# Patient Record
Sex: Male | Born: 1940 | Race: White | Hispanic: No | Marital: Married | Smoking: Former smoker
Health system: Southern US, Community
[De-identification: ages and names within clinical notes are randomized; demographics above are authoritative.]

## PROBLEM LIST (undated history)

## (undated) DIAGNOSIS — J449 Chronic obstructive pulmonary disease, unspecified: Secondary | ICD-10-CM

## (undated) DIAGNOSIS — E739 Lactose intolerance, unspecified: Secondary | ICD-10-CM

## (undated) DIAGNOSIS — M1990 Unspecified osteoarthritis, unspecified site: Secondary | ICD-10-CM

## (undated) DIAGNOSIS — M25559 Pain in unspecified hip: Secondary | ICD-10-CM

## (undated) DIAGNOSIS — I10 Essential (primary) hypertension: Secondary | ICD-10-CM

## (undated) DIAGNOSIS — K4090 Unilateral inguinal hernia, without obstruction or gangrene, not specified as recurrent: Secondary | ICD-10-CM

## (undated) DIAGNOSIS — I959 Hypotension, unspecified: Secondary | ICD-10-CM

## (undated) DIAGNOSIS — K219 Gastro-esophageal reflux disease without esophagitis: Secondary | ICD-10-CM

## (undated) DIAGNOSIS — M545 Low back pain: Secondary | ICD-10-CM

## (undated) DIAGNOSIS — I2510 Atherosclerotic heart disease of native coronary artery without angina pectoris: Secondary | ICD-10-CM

## (undated) DIAGNOSIS — F329 Major depressive disorder, single episode, unspecified: Secondary | ICD-10-CM

## (undated) HISTORY — DX: Lactose intolerance, unspecified: E73.9

## (undated) HISTORY — DX: Pain in unspecified hip: M25.559

## (undated) HISTORY — PX: RECONSTRUCTION MID-FACE: SUR1085

## (undated) HISTORY — DX: Low back pain: M54.5

## (undated) HISTORY — DX: Atherosclerotic heart disease of native coronary artery without angina pectoris: I25.10

## (undated) HISTORY — DX: Unspecified osteoarthritis, unspecified site: M19.90

## (undated) HISTORY — DX: Essential (primary) hypertension: I10

## (undated) HISTORY — PX: TONSILLECTOMY: SUR1361

## (undated) HISTORY — DX: Chronic obstructive pulmonary disease, unspecified: J44.9

## (undated) HISTORY — DX: Unilateral inguinal hernia, without obstruction or gangrene, not specified as recurrent: K40.90

## (undated) HISTORY — DX: Major depressive disorder, single episode, unspecified: F32.9

## (undated) HISTORY — DX: Hypotension, unspecified: I95.9

---
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
## 1997-02-10 ENCOUNTER — Encounter: Payer: Self-pay | Admitting: Internal Medicine

## 1997-04-10 ENCOUNTER — Encounter: Payer: Self-pay | Admitting: Internal Medicine

## 2001-04-18 ENCOUNTER — Encounter: Payer: Self-pay | Admitting: Internal Medicine

## 2004-07-14 ENCOUNTER — Ambulatory Visit: Payer: Self-pay | Admitting: Internal Medicine

## 2004-08-18 ENCOUNTER — Ambulatory Visit: Payer: Self-pay | Admitting: Internal Medicine

## 2004-11-14 ENCOUNTER — Ambulatory Visit: Payer: Self-pay | Admitting: Internal Medicine

## 2005-02-16 ENCOUNTER — Ambulatory Visit: Payer: Self-pay | Admitting: Internal Medicine

## 2005-04-26 ENCOUNTER — Ambulatory Visit: Payer: Self-pay | Admitting: Family Medicine

## 2005-05-01 ENCOUNTER — Ambulatory Visit: Payer: Self-pay | Admitting: Internal Medicine

## 2005-10-23 ENCOUNTER — Ambulatory Visit: Payer: Self-pay | Admitting: Internal Medicine

## 2006-01-22 ENCOUNTER — Ambulatory Visit: Payer: Self-pay | Admitting: Internal Medicine

## 2006-05-28 ENCOUNTER — Ambulatory Visit: Payer: Self-pay | Admitting: Internal Medicine

## 2006-07-10 HISTORY — PX: INGUINAL HERNIA REPAIR: SHX194

## 2006-09-10 ENCOUNTER — Ambulatory Visit: Payer: Self-pay | Admitting: Internal Medicine

## 2007-01-21 ENCOUNTER — Encounter: Payer: Self-pay | Admitting: Internal Medicine

## 2007-01-21 DIAGNOSIS — I10 Essential (primary) hypertension: Secondary | ICD-10-CM | POA: Insufficient documentation

## 2007-01-21 DIAGNOSIS — J309 Allergic rhinitis, unspecified: Secondary | ICD-10-CM

## 2007-01-21 DIAGNOSIS — M1990 Unspecified osteoarthritis, unspecified site: Secondary | ICD-10-CM | POA: Insufficient documentation

## 2007-01-21 DIAGNOSIS — M545 Low back pain, unspecified: Secondary | ICD-10-CM

## 2007-01-21 DIAGNOSIS — J449 Chronic obstructive pulmonary disease, unspecified: Secondary | ICD-10-CM

## 2007-01-21 DIAGNOSIS — J4489 Other specified chronic obstructive pulmonary disease: Secondary | ICD-10-CM

## 2007-01-21 DIAGNOSIS — J441 Chronic obstructive pulmonary disease with (acute) exacerbation: Secondary | ICD-10-CM

## 2007-01-21 HISTORY — DX: Essential (primary) hypertension: I10

## 2007-01-21 HISTORY — DX: Low back pain, unspecified: M54.50

## 2007-01-21 HISTORY — DX: Other specified chronic obstructive pulmonary disease: J44.89

## 2007-01-21 HISTORY — DX: Chronic obstructive pulmonary disease, unspecified: J44.9

## 2007-01-21 HISTORY — DX: Unspecified osteoarthritis, unspecified site: M19.90

## 2007-02-08 ENCOUNTER — Encounter (INDEPENDENT_AMBULATORY_CARE_PROVIDER_SITE_OTHER): Payer: Self-pay

## 2007-02-11 ENCOUNTER — Ambulatory Visit: Payer: Self-pay | Admitting: Internal Medicine

## 2007-03-25 ENCOUNTER — Ambulatory Visit: Payer: Self-pay | Admitting: Internal Medicine

## 2007-03-25 DIAGNOSIS — K4090 Unilateral inguinal hernia, without obstruction or gangrene, not specified as recurrent: Secondary | ICD-10-CM | POA: Insufficient documentation

## 2007-03-25 HISTORY — DX: Unilateral inguinal hernia, without obstruction or gangrene, not specified as recurrent: K40.90

## 2007-04-02 ENCOUNTER — Telehealth (INDEPENDENT_AMBULATORY_CARE_PROVIDER_SITE_OTHER): Payer: Self-pay | Admitting: *Deleted

## 2007-04-23 ENCOUNTER — Ambulatory Visit: Payer: Self-pay | Admitting: Internal Medicine

## 2007-05-13 ENCOUNTER — Ambulatory Visit (HOSPITAL_COMMUNITY): Admission: RE | Admit: 2007-05-13 | Discharge: 2007-05-13 | Payer: Self-pay | Admitting: General Surgery

## 2007-07-05 ENCOUNTER — Ambulatory Visit: Payer: Self-pay | Admitting: Internal Medicine

## 2007-07-08 ENCOUNTER — Telehealth: Payer: Self-pay | Admitting: Internal Medicine

## 2007-11-05 ENCOUNTER — Ambulatory Visit: Payer: Self-pay | Admitting: Internal Medicine

## 2008-03-02 ENCOUNTER — Ambulatory Visit: Payer: Self-pay | Admitting: Internal Medicine

## 2008-03-02 DIAGNOSIS — E739 Lactose intolerance, unspecified: Secondary | ICD-10-CM

## 2008-03-02 HISTORY — DX: Lactose intolerance, unspecified: E73.9

## 2008-04-06 ENCOUNTER — Ambulatory Visit: Payer: Self-pay | Admitting: Internal Medicine

## 2008-04-07 ENCOUNTER — Encounter (INDEPENDENT_AMBULATORY_CARE_PROVIDER_SITE_OTHER): Payer: Self-pay

## 2008-06-09 ENCOUNTER — Ambulatory Visit: Payer: Self-pay | Admitting: Internal Medicine

## 2008-10-27 ENCOUNTER — Telehealth: Payer: Self-pay | Admitting: Internal Medicine

## 2008-10-29 ENCOUNTER — Ambulatory Visit: Payer: Self-pay | Admitting: Internal Medicine

## 2008-10-29 DIAGNOSIS — M25559 Pain in unspecified hip: Secondary | ICD-10-CM

## 2008-10-29 HISTORY — DX: Pain in unspecified hip: M25.559

## 2008-10-30 ENCOUNTER — Ambulatory Visit: Payer: Self-pay | Admitting: Internal Medicine

## 2008-11-02 ENCOUNTER — Telehealth: Payer: Self-pay | Admitting: Internal Medicine

## 2009-03-09 ENCOUNTER — Ambulatory Visit: Payer: Self-pay | Admitting: Internal Medicine

## 2009-04-02 ENCOUNTER — Ambulatory Visit: Payer: Self-pay | Admitting: Internal Medicine

## 2009-04-02 DIAGNOSIS — B029 Zoster without complications: Secondary | ICD-10-CM | POA: Insufficient documentation

## 2009-04-08 ENCOUNTER — Telehealth: Payer: Self-pay | Admitting: Internal Medicine

## 2009-04-13 ENCOUNTER — Ambulatory Visit: Payer: Self-pay | Admitting: Internal Medicine

## 2009-04-13 DIAGNOSIS — I959 Hypotension, unspecified: Secondary | ICD-10-CM

## 2009-04-13 HISTORY — DX: Hypotension, unspecified: I95.9

## 2009-04-13 LAB — CONVERTED CEMR LAB
AST: 23 units/L (ref 0–37)
BUN: 16 mg/dL (ref 6–23)
Basophils Absolute: 0 10*3/uL (ref 0.0–0.1)
Basophils Relative: 0.2 % (ref 0.0–3.0)
Bilirubin, Direct: 0 mg/dL (ref 0.0–0.3)
Calcium: 9.6 mg/dL (ref 8.4–10.5)
Chloride: 102 meq/L (ref 96–112)
Creatinine, Ser: 1.2 mg/dL (ref 0.4–1.5)
Eosinophils Absolute: 0.1 10*3/uL (ref 0.0–0.7)
Eosinophils Relative: 0.8 % (ref 0.0–5.0)
GFR calc non Af Amer: 63.52 mL/min (ref >60)
Glucose, Bld: 96 mg/dL (ref 70–99)
Hemoglobin: 15.5 g/dL (ref 13.0–17.0)
Lymphs Abs: 2.4 10*3/uL (ref 0.7–4.0)
MCV: 88.3 fL (ref 78.0–100.0)
Monocytes Absolute: 1.3 10*3/uL — ABNORMAL HIGH (ref 0.1–1.0)
Neutro Abs: 10.1 10*3/uL — ABNORMAL HIGH (ref 1.4–7.7)
Platelets: 295 10*3/uL (ref 150.0–400.0)
Potassium: 3.8 meq/L (ref 3.5–5.1)
RBC: 5.2 M/uL (ref 4.22–5.81)
WBC: 13.9 10*3/uL — ABNORMAL HIGH (ref 4.5–10.5)

## 2009-05-11 ENCOUNTER — Ambulatory Visit: Payer: Self-pay | Admitting: Internal Medicine

## 2009-05-17 ENCOUNTER — Telehealth: Payer: Self-pay | Admitting: Internal Medicine

## 2009-05-17 DIAGNOSIS — R040 Epistaxis: Secondary | ICD-10-CM

## 2009-05-20 ENCOUNTER — Encounter (INDEPENDENT_AMBULATORY_CARE_PROVIDER_SITE_OTHER): Payer: Self-pay | Admitting: *Deleted

## 2009-08-04 ENCOUNTER — Telehealth: Payer: Self-pay | Admitting: Internal Medicine

## 2009-11-08 ENCOUNTER — Ambulatory Visit: Payer: Self-pay | Admitting: Internal Medicine

## 2009-11-08 DIAGNOSIS — R9431 Abnormal electrocardiogram [ECG] [EKG]: Secondary | ICD-10-CM

## 2009-11-08 DIAGNOSIS — F329 Major depressive disorder, single episode, unspecified: Secondary | ICD-10-CM

## 2009-11-08 DIAGNOSIS — F3289 Other specified depressive episodes: Secondary | ICD-10-CM

## 2009-11-08 HISTORY — DX: Major depressive disorder, single episode, unspecified: F32.9

## 2009-11-08 HISTORY — DX: Other specified depressive episodes: F32.89

## 2009-11-10 ENCOUNTER — Telehealth (INDEPENDENT_AMBULATORY_CARE_PROVIDER_SITE_OTHER): Payer: Self-pay | Admitting: *Deleted

## 2009-11-10 ENCOUNTER — Encounter (INDEPENDENT_AMBULATORY_CARE_PROVIDER_SITE_OTHER): Payer: Self-pay | Admitting: *Deleted

## 2009-11-11 ENCOUNTER — Ambulatory Visit: Payer: Self-pay | Admitting: Internal Medicine

## 2009-11-11 ENCOUNTER — Encounter: Payer: Self-pay | Admitting: Cardiology

## 2009-11-11 ENCOUNTER — Ambulatory Visit: Payer: Self-pay

## 2009-11-11 ENCOUNTER — Encounter (HOSPITAL_COMMUNITY): Admission: RE | Admit: 2009-11-11 | Discharge: 2010-01-07 | Payer: Self-pay | Admitting: Internal Medicine

## 2009-11-11 ENCOUNTER — Encounter (INDEPENDENT_AMBULATORY_CARE_PROVIDER_SITE_OTHER): Payer: Self-pay | Admitting: *Deleted

## 2009-11-12 ENCOUNTER — Telehealth: Payer: Self-pay | Admitting: Internal Medicine

## 2009-11-12 ENCOUNTER — Ambulatory Visit: Payer: Self-pay | Admitting: Internal Medicine

## 2009-11-15 ENCOUNTER — Ambulatory Visit: Payer: Self-pay | Admitting: Internal Medicine

## 2009-11-15 DIAGNOSIS — I2510 Atherosclerotic heart disease of native coronary artery without angina pectoris: Secondary | ICD-10-CM | POA: Insufficient documentation

## 2009-11-15 HISTORY — DX: Atherosclerotic heart disease of native coronary artery without angina pectoris: I25.10

## 2009-11-22 ENCOUNTER — Encounter (INDEPENDENT_AMBULATORY_CARE_PROVIDER_SITE_OTHER): Payer: Self-pay | Admitting: *Deleted

## 2010-02-07 ENCOUNTER — Ambulatory Visit: Payer: Self-pay | Admitting: Internal Medicine

## 2010-02-07 LAB — CONVERTED CEMR LAB
ALT: 27 units/L (ref 0–53)
AST: 27 units/L (ref 0–37)
BUN: 10 mg/dL (ref 6–23)
Basophils Relative: 0.3 % (ref 0.0–3.0)
Bilirubin, Direct: 0.1 mg/dL (ref 0.0–0.3)
CO2: 34 meq/L — ABNORMAL HIGH (ref 19–32)
Cholesterol: 155 mg/dL (ref 0–200)
Creatinine, Ser: 0.8 mg/dL (ref 0.4–1.5)
HDL: 47.4 mg/dL (ref >39.00)
Lymphocytes Relative: 12.1 % (ref 12.0–46.0)
Lymphs Abs: 1.3 10*3/uL (ref 0.7–4.0)
MCHC: 34 g/dL (ref 30.0–36.0)
Neutro Abs: 8.3 10*3/uL — ABNORMAL HIGH (ref 1.4–7.7)
Neutrophils Relative %: 78.1 % — ABNORMAL HIGH (ref 43.0–77.0)
PSA: 1.01 ng/mL (ref 0.10–4.00)
Potassium: 4.2 meq/L (ref 3.5–5.1)
RBC: 4.74 M/uL (ref 4.22–5.81)
RDW: 13.6 % (ref 11.5–14.6)
TSH: 0.41 microintl units/mL (ref 0.35–5.50)
Total Bilirubin: 0.5 mg/dL (ref 0.3–1.2)
Total CHOL/HDL Ratio: 3
Triglycerides: 112 mg/dL (ref 0.0–149.0)
VLDL: 22.4 mg/dL (ref 0.0–40.0)
WBC: 10.6 10*3/uL — ABNORMAL HIGH (ref 4.5–10.5)

## 2010-05-10 ENCOUNTER — Ambulatory Visit: Payer: Self-pay | Admitting: Internal Medicine

## 2010-05-10 LAB — CONVERTED CEMR LAB
Blood Glucose, Fingerstick: 191
Hgb A1c MFr Bld: 6.8 % — ABNORMAL HIGH (ref 4.6–6.5)

## 2010-08-03 ENCOUNTER — Encounter: Payer: Self-pay | Admitting: Internal Medicine

## 2010-08-07 LAB — CONVERTED CEMR LAB
AST: 27 units/L (ref 0–37)
Alkaline Phosphatase: 58 units/L (ref 39–117)
Basophils Absolute: 0.1 10*3/uL (ref 0.0–0.1)
Bilirubin, Direct: 0.1 mg/dL (ref 0.0–0.3)
CO2: 36 meq/L — ABNORMAL HIGH (ref 19–32)
Calcium: 9.4 mg/dL (ref 8.4–10.5)
Eosinophils Relative: 5.7 % — ABNORMAL HIGH (ref 0.0–5.0)
GFR calc Af Amer: 95 mL/min
GFR calc non Af Amer: 79 mL/min
HCT: 43.4 % (ref 39.0–52.0)
Hemoglobin: 15.1 g/dL (ref 13.0–17.0)
Monocytes Absolute: 0.9 10*3/uL (ref 0.1–1.0)
Monocytes Relative: 9.1 % (ref 3.0–12.0)
Neutrophils Relative %: 65.1 % (ref 43.0–77.0)
Platelets: 260 10*3/uL (ref 150–400)
RDW: 12.8 % (ref 11.5–14.6)
Total Bilirubin: 0.6 mg/dL (ref 0.3–1.2)

## 2010-08-11 ENCOUNTER — Encounter: Payer: Self-pay | Admitting: Internal Medicine

## 2010-08-11 ENCOUNTER — Ambulatory Visit (INDEPENDENT_AMBULATORY_CARE_PROVIDER_SITE_OTHER): Payer: Medicare Other | Admitting: Internal Medicine

## 2010-08-11 ENCOUNTER — Ambulatory Visit: Admit: 2010-08-11 | Payer: Self-pay | Admitting: Internal Medicine

## 2010-08-11 VITALS — BP 140/88 | HR 88 | Temp 98.1°F | Resp 18 | Ht 76.0 in | Wt 219.0 lb

## 2010-08-11 DIAGNOSIS — I2510 Atherosclerotic heart disease of native coronary artery without angina pectoris: Secondary | ICD-10-CM

## 2010-08-11 DIAGNOSIS — F411 Generalized anxiety disorder: Secondary | ICD-10-CM

## 2010-08-11 DIAGNOSIS — I10 Essential (primary) hypertension: Secondary | ICD-10-CM

## 2010-08-11 DIAGNOSIS — J449 Chronic obstructive pulmonary disease, unspecified: Secondary | ICD-10-CM

## 2010-08-11 DIAGNOSIS — M1990 Unspecified osteoarthritis, unspecified site: Secondary | ICD-10-CM

## 2010-08-11 MED ORDER — HYDROCODONE-ACETAMINOPHEN 7.5-750 MG PO TABS: 1.0000 | ORAL_TABLET | Freq: Four times a day (QID) | ORAL | Status: DC | PRN

## 2010-08-11 MED ORDER — DIAZEPAM 5 MG PO TABS: 5.0000 mg | ORAL_TABLET | Freq: Every evening | ORAL | Status: DC | PRN

## 2010-08-11 NOTE — Letter (Signed)
Summary: Outpatient Coinsurance Notice  Outpatient Coinsurance Notice   Imported By: Marylou Mccoy 11/12/2009 11:43:51  _____________________________________________________________________  External Attachment:    Type:   Image     Comment:   External Document

## 2010-08-11 NOTE — Progress Notes (Signed)
Summary: strength on citalopram  Phone Note From Pharmacy   [facility]  [address] [id number] Request: Speak with Provider Summary of Call: got ok to change sertaline to citalopram - need strength please.  call back # [phone] Initial call taken by: Duard Brady LPN,  Nov 12, 9809 10:48 AM  Follow-up for Phone Call        [facility] to [facility] done. KIK Follow-up by: Duard Brady LPN,  Nov 12, 9145 12:59 PM    New/Updated Medications: CITALOPRAM HYDROBROMIDE 40 MG TABS (CITALOPRAM HYDROBROMIDE)  40 mg

## 2010-08-11 NOTE — Assessment & Plan Note (Signed)
Summary: 6 week fup//ccm/pt resxcd per dr//ccm   Vital Signs:  Patient profile:   70 year old male Weight:      214 pounds Temp:     98.5 degrees F oral BP sitting:   130 / 70  (right arm) Cuff size:   regular  Vitals Entered By: Duard Brady LPN (February 07, 2010 8:03 AM) CC: 6 wk rov - doing fair Is Patient Diabetic? No   CC:  6 wk rov - doing fair.  History of Present Illness: 70 year old patient who is seen today for follow-up.  His cardiac status is stable.  Last visit.  He was placed on statin therapy.  He does feel that he is perhaps had some mild muscle aching.  He also complains of some left knee pain.  He does have chronic low back pain, hypertension, COPD.  He denies any exertional chest pain.  He does have a history of osteoarthritis.  Allergies: 1)  ! Barbiturates 2)  Amoxicillin (Amoxicillin) 3)  Sulfamethoxazole (Sulfamethoxazole)  Past History:  Past Medical History: Reviewed history from 11/08/2009 and no changes required. COPD Hypertension Low back pain Osteoarthritis Allergic rhinitis impaired glucose tolerance Depression  Past Surgical History: Reviewed history from 03/02/2008 and no changes required. Tonsillectomy Reconstructive surgery/face status post right inguinal hernia 2008   declined screening colonoscopy  Review of Systems  The patient denies anorexia, fever, weight loss, weight gain, vision loss, decreased hearing, hoarseness, chest pain, syncope, dyspnea on exertion, peripheral edema, prolonged cough, headaches, hemoptysis, abdominal pain, melena, hematochezia, severe indigestion/heartburn, hematuria, incontinence, genital sores, muscle weakness, suspicious skin lesions, transient blindness, difficulty walking, depression, unusual weight change, abnormal bleeding, enlarged lymph nodes, angioedema, breast masses, and testicular masses.         left knee pain; mild myalgias  Physical Exam  General:   Well-developed,well-nourished,in no acute distress; alert,appropriate and cooperative throughout examination Head:  Normocephalic and atraumatic without obvious abnormalities. No apparent alopecia or balding. Eyes:  No corneal or conjunctival inflammation noted. EOMI. Perrla. Funduscopic exam benign, without hemorrhages, exudates or papilledema. Vision grossly normal. Mouth:  Oral mucosa and oropharynx without lesions or exudates.   Neck:  No deformities, masses, or tenderness noted. Lungs:  Normal respiratory effort, chest expands symmetrically. Lungs are clear to auscultation, no crackles or wheezes. Heart:  Normal rate and regular rhythm. S1 and S2 normal without gallop, murmur, click, rub or other extra sounds. Abdomen:  Bowel sounds positive,abdomen soft and non-tender without masses, organomegaly or hernias noted. Msk:  No deformity or scoliosis noted of thoracic or lumbar spine.   Extremities:  No clubbing, cyanosis, edema, or deformity noted with normal full range of motion of all joints.     Impression & Recommendations:  Problem # 1:  C A D (ICD-414.00)  His updated medication list for this problem includes:    Benazepril-hydrochlorothiazide 20-25 Mg Tabs (Benazepril-hydrochlorothiazide) ..... Once daily    Aspir-low 81 Mg Tbec (Aspirin) ..... One daily    His updated medication list for this problem includes:    Benazepril-hydrochlorothiazide 20-25 Mg Tabs (Benazepril-hydrochlorothiazide) ..... Once daily    Aspir-low 81 Mg Tbec (Aspirin) ..... One daily  Orders: Venipuncture (47829) TLB-BMP (Basic Metabolic Panel-BMET) (80048-METABOL) TLB-CBC Platelet - w/Differential (85025-CBCD) TLB-TSH (Thyroid Stimulating Hormone) (84443-TSH)  Problem # 2:  ELECTROCARDIOGRAM, ABNORMAL (ICD-794.31)  Problem # 3:  OSTEOARTHRITIS (ICD-715.90)  His updated medication list for this problem includes:    Hydrocodone-acetaminophen 7.5-750 Mg Tabs (Hydrocodone-acetaminophen) ..... One  every 6 hours for pain  Aspir-low 81 Mg Tbec (Aspirin) ..... One daily    His updated medication list for this problem includes:    Hydrocodone-acetaminophen 7.5-750 Mg Tabs (Hydrocodone-acetaminophen) ..... One every 6 hours for pain    Aspir-low 81 Mg Tbec (Aspirin) ..... One daily  Orders: Venipuncture ([address]) TLB-BMP (Basic Metabolic Panel-BMET) (80048-METABOL) TLB-CBC Platelet - w/Differential (85025-CBCD) TLB-TSH (Thyroid Stimulating Hormone) (84443-TSH)  Complete Medication List: 1)  Benazepril-hydrochlorothiazide 20-25 Mg Tabs (Benazepril-hydrochlorothiazide) .... Once daily 2)  Claritin 10 Mg Tabs (Loratadine) .... Take 1 tablet by mouth once a day 3)  Prednisone 5 Mg Tabs (Prednisone) .Marland Kitchen.. 1 once daily 4)  Valium 5 Mg Tabs (Diazepam) .Marland Kitchen.. 1 at bedtime as needed 5)  Valtrex 1 Gm Tabs (Valacyclovir hcl) .... One tablet 3 times daily 6)  Hydrocodone-acetaminophen 7.5-750 Mg Tabs (Hydrocodone-acetaminophen) .... One every 6 hours for pain 7)  Zolpidem Tartrate 10 Mg Tabs (Zolpidem tartrate) .... One at bedtime as needed for sleep 8)  Citalopram Hydrobromide 40 Mg Tabs (Citalopram hydrobromide) 9)  Pravastatin Sodium 20 Mg Tabs (Pravastatin sodium) .... One daily 10)  Aspir-low 81 Mg Tbec (Aspirin) .... One daily  Other Orders: TLB-Lipid Panel (80061-LIPID) TLB-Hepatic/Liver Function Pnl (80076-HEPATIC) TLB-PSA (Prostate Specific Antigen) (84153-PSA) TLB-A1C / Hgb A1C (Glycohemoglobin) (83036-A1C) Specimen Handling ([address])  Patient Instructions: 1)  Please schedule a follow-up appointment in 3 months. 2)  Limit your Sodium (Salt). 3)  It is important that you exercise regularly at least 20 minutes 5 times a week. If you develop chest pain, have severe difficulty breathing, or feel very tired , stop exercising immediately and seek medical attention. Prescriptions: HYDROCODONE-ACETAMINOPHEN 7.5-750 MG TABS (HYDROCODONE-ACETAMINOPHEN) one every 6 hours for pain  #120 x 2    Entered and Authorized by:   Gordy Savers  MD   Signed by:   Gordy Savers  MD on 02/07/2010   Method used:   Print then Give to Patient   RxID:   [id number]

## 2010-08-11 NOTE — Letter (Signed)
Summary: [facility] Cancel and No Reschedule  [facility]  [address] [place], [place] [address]   Phone: [phone]  Fax: [phone]      Nov 22, 2009 MRN: [id number]   Christopher Dalton [address] [place], [place]  [address]     You recently cancelled your colonoscopy procedure at the [facility] and did not reschedule for another date.    Your provider recommended this procedure for the benefit of your health.  It is very important that you reschedule it.  Failure to do so may be to the detriment of your health.  Please call us at [phone] and we will be happy to assist you with rescheduling.    If you were referred for this procedure by another physician/provider, we will notify him/her that you did not keep your appointment.   Sincerely,   [facility]

## 2010-08-11 NOTE — Letter (Signed)
Summary: [facility] Instructions  [facility]  [address] [place], [place] [address]   Phone: [phone]  Fax: [phone]       Christopher Dalton    June 30, 1941    MRN: [id number]       Procedure Day /Date:  Friday 11/26/2009     Arrival Time:  8:00 am     Procedure Time: 9:00 am     Location of Procedure:                    _ x_  [facility] (4th Floor)    PREPARATION FOR COLONOSCOPY WITH MIRALAX  Starting 5 days prior to your procedure Sunday 5/15  do not eat nuts, seeds, popcorn, corn, beans, peas,  salads, or any raw vegetables.  Do not take any fiber supplements (e.g. Metamucil, Citrucel, and Benefiber). ____________________________________________________________________________________________________   THE DAY BEFORE YOUR PROCEDURE         DATE: Thursday 5/19  1   Drink clear liquids the entire day-NO SOLID FOOD  2   Do not drink anything colored red or purple.  Avoid juices with pulp.  No orange juice.  3   Drink at least 64 oz. (8 glasses) of fluid/clear liquids during the day to prevent dehydration and help the prep work efficiently.  CLEAR LIQUIDS INCLUDE: Water Jello Ice Popsicles Tea (sugar ok, no milk/cream) Powdered fruit flavored drinks Coffee (sugar ok, no milk/cream) Gatorade Juice: apple, white grape, white cranberry  Lemonade Clear bullion, consomm, broth Carbonated beverages (any kind) Strained chicken noodle soup Hard Candy  4   Mix the entire bottle of Miralax with 64 oz. of Gatorade/Powerade in the morning and put in the refrigerator to chill.  5   At 3:00 pm take 2 Dulcolax/Bisacodyl tablets.  6   At 4:30 pm take one Reglan/Metoclopramide tablet.  7  Starting at 5:00 pm drink one 8 oz glass of the Miralax mixture every 15-20 minutes until you have finished drinking the entire 64 oz.  You should finish drinking prep around 7:30 or 8:00 pm.  8   If you are nauseated, you may take the 2nd Reglan/Metoclopramide  tablet at 6:30 pm.        9    At 8:00 pm take 2 more DULCOLAX/Bisacodyl tablets.     THE DAY OF YOUR PROCEDURE      DATE:  Friday 5/20  You may drink clear liquids until 7:00 am   (2 HOURS BEFORE PROCEDURE).   MEDICATION INSTRUCTIONS  Unless otherwise instructed, you should take regular prescription medications with a small sip of water as early as possible the morning of your procedure.              Additional medication instructions:  Do not take Benazapril/HCTZ day of procedure.         OTHER INSTRUCTIONS  You will need a responsible adult at least 72 years of age to accompany you and drive you home.   This person must remain in the waiting room during your procedure.  Wear loose fitting clothing that is easily removed.  Leave jewelry and other valuables at home.  However, you may wish to bring a book to read or an iPod/MP3 player to listen to music as you wait for your procedure to start.  Remove all body piercing jewelry and leave at home.  Total time from sign-in until discharge is approximately 2-3 hours.  You should go home directly after your procedure and  rest.  You can resume normal activities the day after your procedure.  The day of your procedure you should not:   Drive   Make legal decisions   Operate machinery   Drink alcohol   Return to work  You will receive specific instructions about eating, activities and medications before you leave.   The above instructions have been reviewed and explained to me by  Ezra Sites RN  Nov 12, 2009 2:30 PM    I fully understand and can verbalize these instructions _____________________________ Date _______

## 2010-08-11 NOTE — Progress Notes (Signed)
Summary: Nuclear Pre-Procedure  Phone Note Outgoing Call   Call placed by: Milana Na, EMT-P,  Nov 10, 2009 2:55 PM Summary of Call: Reviewed information on Myoview Information Sheet (see scanned document for further details).  Spoke with patient.     Nuclear Med Background Indications for Stress Test: Evaluation for Ischemia, Abnormal EKG   History: COPD      Nuclear Pre-Procedure Cardiac Risk Factors: History of Smoking, Hypertension Height (in): 75.25  Nuclear Med Study Referring MD:  P.Kwaitkowski

## 2010-08-11 NOTE — Miscellaneous (Signed)
Summary: [facility] PV  Clinical Lists Changes  Medications: Added new medication of MIRALAX   POWD (POLYETHYLENE GLYCOL 3350) As per prep  instructions. - Signed Added new medication of REGLAN 10 MG  TABS (METOCLOPRAMIDE HCL) As per prep instructions. - Signed Added new medication of DULCOLAX 5 MG  TBEC (BISACODYL) Day before procedure take 2 at 3pm and 2 at 8pm. - Signed Rx of MIRALAX   POWD (POLYETHYLENE GLYCOL 3350) As per prep  instructions.;  #255gm x 0;  Signed;  Entered by: Ezra Sites RN;  Authorized by: Hart Carwin MD;  Method used: [phone] [address] [place], [place], [place]  [address], Ph: [phone], Fax: [phone] Rx of REGLAN 10 MG  TABS (METOCLOPRAMIDE HCL) As per prep instructions.;  #2 x 0;  Signed;  Entered by: Ezra Sites RN;  Authorized by: Hart Carwin MD;  Method used: [facility] [id number] [address], [place], [place], [place]  [address], Ph: [phone], Fax: [phone] Rx of DULCOLAX 5 MG  TBEC (BISACODYL) Day before procedure take 2 at 3pm and 2 at 8pm.;  #4 x 0;  Signed;  Entered by: Ezra Sites RN;  Authorized by: Hart Carwin MD;  Method used: [facility] [phone]*, [address], [place], [place], [place]  [address], Ph: [phone], Fax: [phone] Allergies: Added new allergy or adverse reaction of BARBITURATES    Prescriptions: DULCOLAX 5 MG  TBEC (BISACODYL) Day before procedure take 2 at 3pm and 2 at 8pm.  #4 x 0   Entered by:   Ezra Sites RN   Authorized by:   Hart Carwin MD   Signed by:   Ezra Sites RN on 11/12/2009   Method used:   [facility]        [facility]  [address] [phone]* (retail)       [address]       [place], [place]  [address]       Ph: [phone]       Fax: [phone]   RxID:   [id number] REGLAN 10 MG  TABS (METOCLOPRAMIDE HCL) As per prep instructions.  #2 x 0   Entered by:   Ezra Sites RN   Authorized by:   Hart Carwin MD   Signed by:   Ezra Sites RN on 11/12/2009   Method used:   [facility]        [facility]  [address] [id number] ([facility]ail)       [address]       [place], [place]  [address]       Ph: [phone]       Fax: [phone]   RxID:   [id number] MIRALAX   POWD (POLYETHYLENE GLYCOL 3350) As per prep  instructions.  #255gm x 0   Entered by:   Ezra Sites RN   Authorized by:   Hart Carwin MD   Signed by:   Ezra Sites RN on 11/12/2009   Method used:   [facility]        [facility]  [address] [id number] ([facility]ail)       [address]       [place], [place]  [address]  Ph: [phone]       Fax: [phone]   RxID:   [id number]

## 2010-08-11 NOTE — Patient Instructions (Signed)
Return in 3 months for follow-up Return in 3 months for follow-up Take 81 mg of aspirin daily    It is important that you exercise regularly, at least 20 minutes 3 to 4 times per week.  If you develop chest pain or shortness of breath seek  medical attention.  Limit your sodium (Salt) intake

## 2010-08-11 NOTE — Assessment & Plan Note (Signed)
Summary: CONSULT RE: STRESS TEST/CJR   Vital Signs:  Patient profile:   70 year old male Weight:      216 pounds Temp:     98.3 degrees F oral BP sitting:   160 / 80  (left arm) Cuff size:   regular  Vitals Entered By: Duard Brady LPN (Nov 16, 4399 8:20 AM) CC: f/u on test  Is Patient Diabetic? No   CC:  f/u on test .  History of Present Illness: 14 -year-old patient who is seen today for follow-upthrough the to a new inferior changes on his EKG the patient had a Lexican stress test that showed evidence of an inferior scar, but no perfusion abnormalities.  There is no wall motion abnormalities.  The patient is seen today to discuss the results of the stress test.  Last visit.  He is also placed on a SSRI, which was very helpful.  He is sleeping better and is less anxious.  He denies any cardiopulmonary complaints.  Allergies: 1)  ! Barbiturates 2)  Amoxicillin (Amoxicillin) 3)  Sulfamethoxazole (Sulfamethoxazole)  Physical Exam  General:  Well-developed,well-nourished,in no acute distress; alert,appropriate and cooperative throughout examination; 140/75   Impression & Recommendations:  Problem # 1:  C A D (ICD-414.00)  His updated medication list for this problem includes:    Benazepril-hydrochlorothiazide 20-25 Mg Tabs (Benazepril-hydrochlorothiazide) ..... Once daily    Aspir-low 81 Mg Tbec (Aspirin) ..... 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  Will place on aspirin and statin therapy  His updated medication list for this problem includes:    Benazepril-hydrochlorothiazide 20-25 Mg Tabs (Benazepril-hydrochlorothiazide) .Marland Kitchen... 1/2  once daily    Aspir-low 81 Mg Tbec (Aspirin) ..... One daily  Problem # 2:  ELECTROCARDIOGRAM, ABNORMAL (ICD-794.31)  Problem # 3:  DEPRESSION (ICD-311)  His updated medication list for this problem includes:    Valium 5 Mg Tabs (Diazepam) .Marland Kitchen... 1 at bedtime as needed  Citalopram Hydrobromide 40 Mg Tabs (Citalopram hydrobromide) improved  His updated medication list for this problem includes:    Valium 5 Mg Tabs (Diazepam) .Marland Kitchen... 1 at bedtime as needed    Citalopram Hydrobromide 40 Mg Tabs (Citalopram hydrobromide)  Complete Medication List: 1)  Benazepril-hydrochlorothiazide 20-25 Mg Tabs (Benazepril-hydrochlorothiazide) .... Once daily 2)  Claritin 10 Mg Tabs (Loratadine) .... Take 1 tablet by mouth once a day 3)  Prednisone 5 Mg Tabs (Prednisone) .Marland Kitchen.. 1 once daily 4)  Valium 5 Mg Tabs (Diazepam) .Marland Kitchen.. 1 at bedtime as needed 5)  Valtrex 1 Gm Tabs (Valacyclovir hcl) .... One tablet 3 times daily 6)  Hydrocodone-acetaminophen 7.5-750 Mg Tabs (Hydrocodone-acetaminophen) .... One every 6 hours for pain 7)  Zolpidem Tartrate 10 Mg Tabs (Zolpidem tartrate) .... One at bedtime as needed for sleep 8)  Citalopram Hydrobromide 40 Mg Tabs (Citalopram hydrobromide) 9)  Miralax Powd (Polyethylene glycol 3350) .... As per prep  instructions. 10)  Reglan 10 Mg Tabs (Metoclopramide hcl) .... As per prep instructions. 11)  Dulcolax 5 Mg Tbec (Bisacodyl) .... Day before procedure take 2 at 3pm and 2 at 8pm. 12)  Pravastatin Sodium 20 Mg Tabs (Pravastatin sodium) .... One daily 13)  Aspir-low 81 Mg Tbec (Aspirin) .... One daily  Other Orders: Prescription Created Electronically [phone])  Patient Instructions: 1)  reschedule for 3 months 2)  Limit your Sodium (Salt) to less than 2 grams a day(slightly less than 1/2 a teaspoon) to prevent fluid retention, swelling, or worsening of  symptoms. 3)  It is important that you exercise regularly at least 20 minutes 5 times a week. If you develop chest pain, have severe difficulty breathing, or feel very tired , stop exercising immediately and seek medical attention. Prescriptions: PRAVASTATIN SODIUM 20 MG TABS (PRAVASTATIN SODIUM) one daily  #90 x 3   Entered and Authorized by:   Gordy Savers  MD   Signed by:   Gordy Savers  MD on 11/15/2009   Method used:   [facility]        [facility]  [address] [phone]* (retail)       [address]       [place], [place]  [address]       Ph: [phone]       Fax: [phone]   RxID:   [id number]

## 2010-08-11 NOTE — Progress Notes (Signed)
  Subjective:    Patient ID: Christopher Dalton, male    DOB: 08/10/1940, 70 y.o.   MRN: [id number]  HPI  66 -year-old patient who is seen today for follow-up.He has a history of coronary artery disease and was placed on Pravachol last visit.  This was self discontinued due to myalgias.  He was not able to tolerate even a small dose.  He has a history of COPD, which has been stable.  He denies any ischemic chest pain.  He has a history of mild impaired glucose tolerance and osteoarthritis.  Is requesting a refill on his pain medication.  He also has severe allergic rhinitis, which has been fairly stable.  Due to his pulmonary disease.  He remains on low-dose prednisone therapy.     Review of Systems  Constitutional: Negative for fever, chills, appetite change and fatigue.  HENT: Negative for hearing loss, ear pain, congestion, sore throat, trouble swallowing, neck stiffness, dental problem, voice change and tinnitus.   Eyes: Negative for pain, discharge and visual disturbance.  Respiratory: Negative for cough, chest tightness, wheezing and stridor.   Cardiovascular: Negative for chest pain, palpitations and leg swelling.  Gastrointestinal: Negative for nausea, vomiting, abdominal pain, diarrhea, constipation, blood in stool and abdominal distention.  Genitourinary: Negative for urgency, hematuria, flank pain, discharge, difficulty urinating and genital sores.  Musculoskeletal: Negative for myalgias, back pain, joint swelling, arthralgias and gait problem.  Skin: Negative for rash.  Neurological: Negative for dizziness, syncope, speech difficulty, weakness, numbness and headaches.  Hematological: Negative for adenopathy. Does not bruise/bleed easily.  Psychiatric/Behavioral: Negative for behavioral problems and dysphoric mood. The patient is not nervous/anxious.         Objective:   Physical Exam  Constitutional: He is oriented to person, place, and time. He appears well-developed.  HENT:    Head: Normocephalic.  Right Ear: External ear normal.  Left Ear: External ear normal.  Mouth/Throat: Oropharynx is clear and moist.  Eyes: Conjunctivae and EOM are normal. Pupils are equal, round, and reactive to light.  Neck: Normal range of motion. Neck supple.  Cardiovascular: Normal rate and normal heart sounds.   Pulmonary/Chest: Effort normal and breath sounds normal. He has no wheezes. He has no rales.  Abdominal: Bowel sounds are normal.  Musculoskeletal: Normal range of motion. He exhibits no edema and no tenderness.  Neurological: He is alert and oriented to person, place, and time.  Skin: Skin is warm and dry.  Psychiatric: He has a normal mood and affect. His behavior is normal.          Assessment & Plan:  1. Hypertension stable.  Will continue present regimen 2.  COPD stable 3.  Statin intolerance-pravastatin, discontinued 4. Coronary artery disease, stable 5. Osteoarthritis.  Plan medication refill

## 2010-08-11 NOTE — Assessment & Plan Note (Signed)
Summary: CPX NO LAB PER INSTRUCTIONS//SLM   Vital Signs:  Patient profile:   70 year old male Height:      75.25 inches Weight:      218 pounds BMI:     27.17 Temp:     98.0 degrees F oral Pulse rate:   68 / minute Pulse rhythm:   regular BP sitting:   142 / 84  (left arm) Cuff size:   regular  Vitals Entered By: Duard Brady LPN (Nov 08, 1608 8:46 AM) CC: cpx - c/o insomnia, low back pain, evelvated pulse Is Patient Diabetic? No   CC:  cpx - c/o insomnia, low back pain, and evelvated pulse.  History of Present Illness: 70 year old patient who is seen today in for a comprehensive evaluation.  Medical problems include hypertension impaired glucose tolerance, COPD.  His main complaint today is insomnia and mild depression.  This has been present for about 6 months.  He is accounted by his wife.  He also has chronic low back pain. EKG was reviewed today and revealed some more prominent inferior ST-T wave changes.  No cardiopulmonary complaints. Here for Medicare AWV:  1.   Risk factors based on Past M, S, F history:  gross factors include hypertension, and history of tobacco use. 2.   Physical Activities:   limited due to low back pain 3.   Depression/mood:  history depression, and insomnia for the past 6 months 4.   Hearing: mild impairment with history of 10 at this 5.   ADL's: totally independent 6.   Fall Risk: minimal 7.   Home Safety: no issues identified 8.   Height, weight, &visual acuity:  stable no difficulty with visual acuity 9.   Counseling:  exercise regimen discussed 10.   Labs ordered based on risk factors:  will check a random blood sugar.  Laboratory panel was checked last fall 11.           Referral Coordination-  will set up for screening colonoscopy 12.           Care Plan- will place on antidepressant medication 13.            Cognitive Assessment- no problems identified.  accompanied  by his wife who has no concerns about cognitive  dysfunction   Allergies: 1)  Amoxicillin (Amoxicillin) 2)  Sulfamethoxazole (Sulfamethoxazole)  Past History:  Past Medical History: COPD Hypertension Low back pain Osteoarthritis Allergic rhinitis impaired glucose tolerance Depression  Past Surgical History: Reviewed history from 03/02/2008 and no changes required. Tonsillectomy Reconstructive surgery/face status post right inguinal hernia 2008   declined screening colonoscopy  Family History: Reviewed history from 07/05/2007 and no changes required. fatherr died of cancer of unclear type mother died of lung cancer  no siblings  Social History: Reviewed history from 03/02/2008 and no changes required. former smoker  Review of Systems       The patient complains of decreased hearing, difficulty walking, and depression.  The patient denies anorexia, fever, weight loss, weight gain, vision loss, hoarseness, chest pain, syncope, dyspnea on exertion, peripheral edema, prolonged cough, headaches, hemoptysis, abdominal pain, melena, hematochezia, severe indigestion/heartburn, hematuria, incontinence, genital sores, muscle weakness, suspicious skin lesions, transient blindness, unusual weight change, abnormal bleeding, enlarged lymph nodes, angioedema, breast masses, and testicular masses.    Physical Exam  General:  Well-developed,well-nourished,in no acute distress; alert,appropriate and cooperative throughout examination Head:  Normocephalic and atraumatic without obvious abnormalities. No apparent alopecia or balding. Eyes:  No corneal or  conjunctival inflammation noted. EOMI. Perrla. Funduscopic exam benign, without hemorrhages, exudates or papilledema. Vision grossly normal. Ears:  External ear exam shows no significant lesions or deformities.  Otoscopic examination reveals clear canals, tympanic membranes are intact bilaterally without bulging, retraction, inflammation or discharge. Hearing is grossly normal  bilaterally. Nose:  External nasal examination shows no deformity or inflammation. Nasal mucosa are pink and moist without lesions or exudates. Mouth:  Oral mucosa and oropharynx without lesions or exudates.  dentures in place Neck:  No deformities, masses, or tenderness noted. Chest Wall:  No deformities, masses, tenderness or gynecomastia noted. Breasts:  No masses or gynecomastia noted Lungs:  Normal respiratory effort, chest expands symmetrically. Lungs are clear to auscultation, no crackles or wheezes. Heart:  Normal rate and regular rhythm. S1 and S2 normal without gallop, murmur, click, rub or other extra sounds. Abdomen:  Bowel sounds positive,abdomen soft and non-tender without masses, organomegaly or hernias noted. Rectal:  incomplete due to tight rectal tone Genitalia:  Testes bilaterally descended without nodularity, tenderness or masses. No scrotal masses or lesions. No penis lesions or urethral discharge. Prostate:  incomplete exam due to tight rectal tone.  Prostate not exam and Msk:  No deformity or scoliosis noted of thoracic or lumbar spine.   Pulses:  posterior tibia pulses full; dorsalis pedis pulses faint Extremities:  No clubbing, cyanosis, edema, or deformity noted with normal full range of motion of all joints.   Neurologic:  No cranial nerve deficits noted. Station and gait are normal. Plantar reflexes are down-going bilaterally. DTRs are symmetrical throughout. Sensory, motor and coordinative functions appear intact. Skin:  Intact without suspicious lesions or rashes Cervical Nodes:  No lymphadenopathy noted Axillary Nodes:  No palpable lymphadenopathy Inguinal Nodes:  No significant adenopathy Psych:  Cognition and judgment appear intact. Alert and cooperative with normal attention span and concentration. No apparent delusions, illusions, hallucinations   Impression & Recommendations:  Problem # 1:  DEPRESSION (ICD-311)  His updated medication list for this  problem includes:    Valium 5 Mg Tabs (Diazepam) .Marland Kitchen... 1 at bedtime as needed    Sertraline Hcl 50 Mg Tabs (Sertraline hcl) ..... One every morning  His updated medication list for this problem includes:    Valium 5 Mg Tabs (Diazepam) .Marland Kitchen... 1 at bedtime as needed  Problem # 2:  IMPAIRED GLUCOSE TOLERANCE (ICD-271.3)  Orders: Gastroenterology Referral (GI)  Problem # 3:  PREVENTIVE HEALTH CARE (ICD-V70.0)  Orders: First annual wellness visit with prevention plan  (Z6109) Gastroenterology Referral (GI)  Problem # 4:  OSTEOARTHRITIS (ICD-715.90)  His updated medication list for this problem includes:    Hydrocodone-acetaminophen 7.5-750 Mg Tabs (Hydrocodone-acetaminophen) ..... One every 6 hours for pain  His updated medication list for this problem includes:    Hydrocodone-acetaminophen 7.5-750 Mg Tabs (Hydrocodone-acetaminophen) ..... One every 6 hours for pain  Problem # 5:  HYPERTENSION (ICD-401.9)  His updated medication list for this problem includes:    Benazepril-hydrochlorothiazide 20-25 Mg Tabs (Benazepril-hydrochlorothiazide) .Marland Kitchen... 1/2  once daily  Orders: EKG w/ Interpretation (93000)  His updated medication list for this problem includes:    Benazepril-hydrochlorothiazide 20-25 Mg Tabs (Benazepril-hydrochlorothiazide) .Marland Kitchen... 1/2  once daily  Problem # 6:  COPD (ICD-496)  Complete Medication List: 1)  Benazepril-hydrochlorothiazide 20-25 Mg Tabs (Benazepril-hydrochlorothiazide) .... 1/2  once daily 2)  Claritin 10 Mg Tabs (Loratadine) .... Take 1 tablet by mouth once a day 3)  Prednisone 5 Mg Tabs (Prednisone) .Marland Kitchen.. 1 once daily 4)  Valium 5 Mg Tabs (  Diazepam) .Marland Kitchen.. 1 at bedtime as needed 5)  Valtrex 1 Gm Tabs (Valacyclovir hcl) .... One tablet 3 times daily 6)  Hydrocodone-acetaminophen 7.5-750 Mg Tabs (Hydrocodone-acetaminophen) .... One every 6 hours for pain 7)  Sertraline Hcl 50 Mg Tabs (Sertraline hcl) .... One every morning 8)  Zolpidem Tartrate 10 Mg Tabs  (Zolpidem tartrate) .... One at bedtime as needed for sleep  Other Orders: Cardiolite (Cardiolite)  Patient Instructions: 1)  Please schedule a follow-up appointment in 6 weeks 2)  Limit your Sodium (Salt) to less than 2 grams a day(slightly less than 1/2 a teaspoon) to prevent fluid retention, swelling, or worsening of symptoms. 3)  It is important that you exercise regularly at least 20 minutes 5 times a week. If you develop chest pain, have severe difficulty breathing, or feel very tired , stop exercising immediately and seek medical attention. 4)  Schedule a colonoscopy/sigmoidoscopy to help detect colon cancer. 5)  Take an Aspirin every day. Prescriptions: ZOLPIDEM TARTRATE 10 MG TABS (ZOLPIDEM TARTRATE) one at bedtime as needed for sleep  #50 x 0   Entered and Authorized by:   Gordy Savers  MD   Signed by:   Gordy Savers  MD on 11/08/2009   Method used:   Print then Give to Patient   RxID:   [id number] SERTRALINE HCL 50 MG TABS (SERTRALINE HCL) one every morning  #90 x 0   Entered and Authorized by:   Gordy Savers  MD   Signed by:   Gordy Savers  MD on 11/08/2009   Method used:   Print then Give to Patient   RxID:   [id number] VALIUM 5 MG  TABS (DIAZEPAM) 1 at bedtime as needed  #90 x 1   Entered and Authorized by:   Gordy Savers  MD   Signed by:   Gordy Savers  MD on 11/08/2009   Method used:   Print then Give to Patient   RxID:   [id number] PREDNISONE 5 MG TABS (PREDNISONE) 1 once daily  #90 Tablet x 3   Entered and Authorized by:   Gordy Savers  MD   Signed by:   Gordy Savers  MD on 11/08/2009   Method used:   Print then Give to Patient   RxID:   [id number] BENAZEPRIL-HYDROCHLOROTHIAZIDE 20-25 MG TABS (BENAZEPRIL-HYDROCHLOROTHIAZIDE) 1/2  once daily  #90 x 6   Entered and Authorized by:   Gordy Savers  MD   Signed by:   Gordy Savers  MD on 11/08/2009   Method used:   Print then  Give to Patient   RxID:   [id number]

## 2010-08-11 NOTE — Assessment & Plan Note (Signed)
Summary: 3 month fup//ccm   Vital Signs:  Dalton profile:   70 year old male Weight:      223 pounds Temp:     98.0 degrees F oral Pulse rate:   78 / minute BP sitting:   150 / 70  (right arm) Cuff size:   large  Vitals Entered By: Romualdo Bolk, CMA (AAMA) (May 10, 2010 7:58 AM) CC: follow-up visit CBG Result 191   CC:  follow-up visit.  History of Present Illness: Christopher Dalton who is seen today for a follow-up.  He has a history of suspected coronary artery disease based on an abnormal EKG that revealed inferior changes.  A subsequent nuclear medicine stress test suggested a prior infero-posterior scar.  The Dalton is on ACE inhibition for hypertension and also aspirin and statin therapy due to probable coronary artery disease.  He has COPD, but no history of exertional chest pain.  He has chronic back pain and insomnia, and these are his only complaints today.  He has been on statin therapy, which he continues to tolerate.  He also has a history of impaired glucose tolerance.  Last hemoglobin A1c6.5.  Preventive Screening-Counseling & Management  Alcohol-Tobacco     Smoking Status: quit  Current Medications (verified): 1)  Benazepril-Hydrochlorothiazide 20-25 Mg Tabs (Benazepril-Hydrochlorothiazide) .... Once Daily 2)  Claritin 10 Mg Tabs (Loratadine) .... Take 1 Tablet By Mouth Once A Day 3)  Prednisone 5 Mg Tabs (Prednisone) .Marland Kitchen.. 1 Once Daily 4)  Valium 5 Mg  Tabs (Diazepam) .Marland Kitchen.. 1 At Bedtime As Needed 5)  Hydrocodone-Acetaminophen 7.5-750 Mg Tabs (Hydrocodone-Acetaminophen) .... One Every 6 Hours For Pain 6)  Pravastatin Sodium 20 Mg Tabs (Pravastatin Sodium) .... One Daily 7)  Aspir-Low 81 Mg Tbec (Aspirin) .... One Daily  Allergies (verified): 1)  ! Barbiturates 2)  Amoxicillin (Amoxicillin) 3)  Sulfamethoxazole (Sulfamethoxazole)  Past History:  Past Medical History: COPD Hypertension Low back pain Osteoarthritis Allergic rhinitis impaired  glucose tolerance Depression probable coronary artery disease ( inferior scar and on Myoview)  statin therapy  Family History: Reviewed history from 07/05/2007 and no changes required. fatherr died of cancer of unclear type mother died of lung cancer  no siblings  Social History: Reviewed history from 03/02/2008 and no changes required. former smoker  Review of Systems  The Dalton denies anorexia, fever, weight loss, weight gain, vision loss, decreased hearing, hoarseness, chest pain, syncope, dyspnea on exertion, peripheral edema, prolonged cough, headaches, hemoptysis, abdominal pain, melena, hematochezia, severe indigestion/heartburn, hematuria, incontinence, genital sores, muscle weakness, suspicious skin lesions, transient blindness, difficulty walking, depression, unusual weight change, abnormal bleeding, enlarged lymph nodes, angioedema, breast masses, and testicular masses.    Physical Exam  General:  Well-developed,well-nourished,in no acute distress; alert,appropriate and cooperative throughout examination; 144/70 Head:  Normocephalic and atraumatic without obvious abnormalities. No apparent alopecia or balding. Eyes:  No corneal or conjunctival inflammation noted. EOMI. Perrla. Funduscopic exam benign, without hemorrhages, exudates or papilledema. Vision grossly normal. Mouth:  Oral mucosa and oropharynx without lesions or exudates.  Teeth in good repair. Neck:  No deformities, masses, or tenderness noted. Lungs:  Normal respiratory effort, chest expands symmetrically. Lungs are clear to auscultation, no crackles or wheezes. Heart:  Normal rate and regular rhythm. S1 and S2 normal without gallop, murmur, click, rub or other extra sounds. Abdomen:  Bowel sounds positive,abdomen soft and non-tender without masses, organomegaly or hernias noted. Msk:  No deformity or scoliosis noted of thoracic or lumbar spine.   Pulses:  posterior tibia pulses were full Extremities:  No  clubbing, cyanosis, edema, or deformity noted with normal full range of motion of all joints.   Skin:  Intact without suspicious lesions or rashes Cervical Nodes:  No lymphadenopathy noted   Impression & Recommendations:  Problem # 1:  IMPAIRED GLUCOSE TOLERANCE (ICD-271.3)  hemoglobin A1c was 6.5.  Will check a random blood sugar today  Orders: TLB-A1C / Hgb A1C (Glycohemoglobin) (83036-A1C) Specimen Handling (69629)  Problem # 2:  ENCOUNTER FOR THERAPEUTIC DRUG MONITORING (ICD-V58.83)  will check an SGOT  Orders: Venipuncture (52841) TLB-AST (SGOT) (84450-SGOT) Specimen Handling (32440)  Problem # 3:  LOW BACK PAIN (ICD-724.2)  His updated medication list for this problem includes:    Hydrocodone-acetaminophen 7.5-750 Mg Tabs (Hydrocodone-acetaminophen) ..... One every 6 hours for pain    Aspir-low 81 Mg Tbec (Aspirin) ..... One daily  Problem # 4:  HYPERTENSION (ICD-401.9)  His updated medication list for this problem includes:    Benazepril-hydrochlorothiazide 20-25 Mg Tabs (Benazepril-hydrochlorothiazide) ..... Once daily  Problem # 5:  COPD (ICD-496) will give a flu vaccine and Pneumovax today  Complete Medication List: 1)  Benazepril-hydrochlorothiazide 20-25 Mg Tabs (Benazepril-hydrochlorothiazide) .... Once daily 2)  Claritin 10 Mg Tabs (Loratadine) .... Take 1 tablet by mouth once a day 3)  Prednisone 5 Mg Tabs (Prednisone) .Marland Kitchen.. 1 once daily 4)  Valium 5 Mg Tabs (Diazepam) .Marland Kitchen.. 1 at bedtime as needed 5)  Hydrocodone-acetaminophen 7.5-750 Mg Tabs (Hydrocodone-acetaminophen) .... One every 6 hours for pain 6)  Pravastatin Sodium 20 Mg Tabs (Pravastatin sodium) .... One daily 7)  Aspir-low 81 Mg Tbec (Aspirin) .... One daily  Other Orders: Capillary Blood Glucose/CBG (10272) Flu Vaccine 38yrs + MEDICARE PATIENTS (Z3664) Administration Flu vaccine - MCR (G0008) Pneumococcal Vaccine (40347) Admin 1st Vaccine (42595)  Dalton Instructions: 1)  Please schedule  a follow-up appointment in 3 months. 2)  Limit your Sodium (Salt). 3)  It is important that you exercise regularly at least 20 minutes 5 times a week. If you develop chest pain, have severe difficulty breathing, or feel very tired , stop exercising immediately and seek medical attention. 4)  Check your Blood Pressure regularly. If it is above: 160/90  you should make an appointment. Prescriptions: PRAVASTATIN SODIUM 20 MG TABS (PRAVASTATIN SODIUM) one daily  #90 x 3   Entered and Authorized by:   Gordy Savers  MD   Signed by:   Gordy Savers  MD on 05/10/2010   Method used:   Print then Give to Dalton   RxID:   [id number] HYDROCODONE-ACETAMINOPHEN 7.5-750 MG TABS (HYDROCODONE-ACETAMINOPHEN) one every 6 hours for pain  #120 x 2   Entered and Authorized by:   Gordy Savers  MD   Signed by:   Gordy Savers  MD on 05/10/2010   Method used:   Print then Give to Dalton   RxID:   [id number] VALIUM 5 MG  TABS (DIAZEPAM) 1 at bedtime as needed  #90 x 3   Entered and Authorized by:   Gordy Savers  MD   Signed by:   Gordy Savers  MD on 05/10/2010   Method used:   Print then Give to Dalton   RxID:   [id number] PREDNISONE 5 MG TABS (PREDNISONE) 1 once daily  #90 Tablet x 3   Entered and Authorized by:   Gordy Savers  MD   Signed by:   Gordy Savers  MD on 05/10/2010   Method used:  Print then Give to Dalton   RxID:   [id number] BENAZEPRIL-HYDROCHLOROTHIAZIDE 20-25 MG TABS (BENAZEPRIL-HYDROCHLOROTHIAZIDE) once daily  #90 x 6   Entered and Authorized by:   Gordy Savers  MD   Signed by:   Gordy Savers  MD on 05/10/2010   Method used:   Print then Give to Dalton   RxID:   [id number]    Orders Added: 1)  Est. Dalton Level IV [41324] 2)  Venipuncture [40102] 3)  TLB-AST (SGOT) [84450-SGOT] 4)  TLB-A1C / Hgb A1C (Glycohemoglobin) [83036-A1C] 5)  Capillary Blood Glucose/CBG [82948] 6)  Flu Vaccine  41yrs + MEDICARE PATIENTS [Q2039] 7)  Administration Flu vaccine - MCR [G0008] 8)  Pneumococcal Vaccine [90732] 9)  Admin 1st Vaccine [90471] 10)  Specimen Handling [99000]   Immunizations Administered:  Pneumonia Vaccine:    Vaccine Type: Pneumovax    Site: right deltoid    Mfr: Merck    Dose: 0.5 ml    Route: IM    Given by: Sid Falcon LPN    Exp. Date: 11/01/2011    Lot #: [id number]   Immunizations Administered:  Pneumonia Vaccine:    Vaccine Type: Pneumovax    Site: right deltoid    Mfr: Merck    Dose: 0.5 ml    Route: IM    Given by: Sid Falcon LPN    Exp. Date: 11/01/2011    Lot #: [id number]   Flu Vaccine Consent Questions     Do you have a history of severe allergic reactions to this vaccine? no    Any prior history of allergic reactions to egg and/or gelatin? no    Do you have a sensitivity to the preservative Thimersol? no    Do you have a past history of Guillan-Barre Syndrome? no    Do you currently have an acute febrile illness? no    Have you ever had a severe reaction to latex? no    Vaccine information given and explained to Dalton? yes    Are you currently pregnant? no    Lot Number:[id number]   Exp Date:01/07/2011   Site Given  Left Deltoid IMflu1

## 2010-08-11 NOTE — Assessment & Plan Note (Signed)
Summary: Cardiology Nuclear Study  Nuclear Med Background Indications for Stress Test: Evaluation for Ischemia, Abnormal EKG   History: COPD  History Comments: NO DOCUMENTED CAD  Symptoms: Dizziness, DOE, Fatigue, Palpitations    Nuclear Pre-Procedure Cardiac Risk Factors: History of Smoking, Hypertension Caffeine/Decaff Intake: None NPO After: 6:00 AM Lungs: Clear.  O2 Sat 96% on RA. IV 0.9% NS with Angio Cath: 20g     IV Site: (R) AC IV Started by: Stanton Kidney EMT-P Chest Size (in) 46     Height (in): 75 Weight (lb): 212 BMI: 26.59  Nuclear Med Study 1 or 2 day study:  1 day     Stress Test Type:  Eugenie Birks Reading MD:  Arvilla Meres, MD     Referring MD:  Eleonore Chiquito, MD Resting Radionuclide:  Technetium 70m Tetrofosmin     Resting Radionuclide Dose:  11 mCi  Stress Radionuclide:  Technetium 52m Tetrofosmin     Stress Radionuclide Dose:  33 mCi   Stress Protocol   Lexiscan: 0.4 mg   Stress Test Technologist:  Rea [employer] CMA-N     Nuclear Technologist:  Domenic Polite CNMT  Rest Procedure  Myocardial perfusion imaging was performed at rest 45 minutes following the intravenous administration of Myoview Technetium 18m Tetrofosmin.  Stress Procedure  The patient received IV Lexiscan 0.4 mg over 15-seconds.  Myoview injected at 30-seconds.  There were nonspecific T-wave changes with lexiscan.  Quantitative spect images were obtained after a 45 minute delay.  QPS Raw Data Images:  Normal; no motion artifact; normal heart/lung ratio. Stress Images:  Decreased uptake in the inferior wall and inferoseptum. Rest Images:  Decreased uptake in the inferior wall and inferoseptum. Subtraction (SDS):  Previous inferior and inferoseptal infarct. No significant ischemia Transient Ischemic Dilatation:  .95  (Normal <1.22)  Lung/Heart Ratio:  .26  (Normal <0.45)  Quantitative Gated Spect Images QGS EDV:  83 ml QGS ESV:  38 ml QGS EF:  54 % QGS cine images:   Normal  Findings Abnormal nuclear study  Evidence for inferior infarct     Overall Impression  Exercise Capacity: Lexiscan study with no exercise. ECG Impression: Baseline: NSR; No significant ST segment change with Lexiscan. Overall Impression: Abnormal stress nuclear study. 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 Wall motion appears normal.   Appended Document: Cardiology Nuclear Study please schedule follow-up office visit within the next week or two to discuss results of the stress test and further treatment  Appended Document: Cardiology Nuclear Study spoke with pt - will schedule appt next wk to discuss results   KIK

## 2010-08-11 NOTE — Progress Notes (Signed)
Summary: refill  Phone Note Call from Patient Call back at Home Phone [phone]   Caller: Patient-live call Reason for Call: Refill Medication Summary of Call: refill hydrocodone at [facility]. Has appt in march. Initial call taken by: Warnell Forester,  August 04, 2009 10:54 AM  Follow-up for Phone Call        #120 RF 2 Follow-up by: Gordy Savers  MD,  August 04, 2009 11:01 AM    Prescriptions: HYDROCODONE-ACETAMINOPHEN 7.5-750 MG TABS (HYDROCODONE-ACETAMINOPHEN) one every 6 hours for pain  #120 x 2   Entered by:   Raechel Ache, RN   Authorized by:   Gordy Savers  MD   Signed by:   Raechel Ache, RN on 08/05/2009   Method used:   Printed then faxed to ...       [facility]  [address] [id number] ([facility]       [address]       [place], [place]  [address]       Ph: [phone]       Fax: [phone]   RxID:   [id number]

## 2010-08-17 NOTE — Assessment & Plan Note (Signed)
Summary: 3 MONTH ROV/[facility]SC BMP Cyndia Skeeters   Allergies: 1)  ! Barbiturates 2)  Amoxicillin (Amoxicillin) 3)  Sulfamethoxazole (Sulfamethoxazole)   Complete Medication List: 1)  Benazepril-hydrochlorothiazide 20-25 Mg Tabs (Benazepril-hydrochlorothiazide) .... Once daily 2)  Claritin 10 Mg Tabs (Loratadine) .... Take 1 tablet by mouth once a day 3)  Prednisone 5 Mg Tabs (Prednisone) .Marland Kitchen.. 1 once daily 4)  Valium 5 Mg Tabs (Diazepam) .Marland Kitchen.. 1 at bedtime as needed 5)  Hydrocodone-acetaminophen 7.5-750 Mg Tabs (Hydrocodone-acetaminophen) .... One every 6 hours for pain 6)  Pravastatin Sodium 20 Mg Tabs (Pravastatin sodium) .... One daily 7)  Aspir-low 81 Mg Tbec (Aspirin) .... One daily

## 2010-09-28 ENCOUNTER — Telehealth: Payer: Self-pay | Admitting: Internal Medicine

## 2010-09-28 NOTE — Telephone Encounter (Signed)
Pt called and said that he rcvd a $50 no show fee for his office on 08/11/10. Pt says that his appt card said ov 08/10/10. Pt says that he was here at [facility] on this date. Pls call pt back.

## 2010-09-30 NOTE — Telephone Encounter (Signed)
Call completed

## 2010-11-09 ENCOUNTER — Encounter: Payer: Self-pay | Admitting: Internal Medicine

## 2010-11-09 ENCOUNTER — Ambulatory Visit (INDEPENDENT_AMBULATORY_CARE_PROVIDER_SITE_OTHER): Payer: Medicare Other | Admitting: Internal Medicine

## 2010-11-09 DIAGNOSIS — R7302 Impaired glucose tolerance (oral): Secondary | ICD-10-CM

## 2010-11-09 DIAGNOSIS — G4700 Insomnia, unspecified: Secondary | ICD-10-CM

## 2010-11-09 DIAGNOSIS — M1990 Unspecified osteoarthritis, unspecified site: Secondary | ICD-10-CM

## 2010-11-09 DIAGNOSIS — J449 Chronic obstructive pulmonary disease, unspecified: Secondary | ICD-10-CM

## 2010-11-09 DIAGNOSIS — J309 Allergic rhinitis, unspecified: Secondary | ICD-10-CM

## 2010-11-09 DIAGNOSIS — I10 Essential (primary) hypertension: Secondary | ICD-10-CM

## 2010-11-09 DIAGNOSIS — R7309 Other abnormal glucose: Secondary | ICD-10-CM

## 2010-11-09 MED ORDER — HYDROCODONE-ACETAMINOPHEN 7.5-750 MG PO TABS: 1.0000 | ORAL_TABLET | Freq: Four times a day (QID) | ORAL | Status: DC | PRN

## 2010-11-09 MED ORDER — TRAZODONE HCL 50 MG PO TABS: 50.0000 mg | ORAL_TABLET | Freq: Every day | ORAL | Status: DC

## 2010-11-09 NOTE — Progress Notes (Signed)
  Subjective:    Patient ID: Christopher Dalton, male    DOB: [birth year], 70 y.o.   MRN: [id number]  HPI  70 year old patient who has a history of COPD and allergic rhinitis. He has done well during the allergy season. His COPD has been stable. He has treated hypertension which has been well-controlled on combination therapy and remains on low-dose prednisone he has coronary artery disease which also has been stable denies any shortness of breath or exertional chest pain. He remains on pravastatin. His only complaint is insomnia    Review of Systems  Constitutional: Negative for fever, chills, appetite change and fatigue.  HENT: Negative for hearing loss, ear pain, congestion, sore throat, trouble swallowing, neck stiffness, dental problem, voice change and tinnitus.   Eyes: Negative for pain, discharge and visual disturbance.  Respiratory: Negative for cough, chest tightness, wheezing and stridor.   Cardiovascular: Negative for chest pain, palpitations and leg swelling.  Gastrointestinal: Negative for nausea, vomiting, abdominal pain, diarrhea, constipation, blood in stool and abdominal distention.  Genitourinary: Negative for urgency, hematuria, flank pain, discharge, difficulty urinating and genital sores.  Musculoskeletal: Negative for myalgias, back pain, joint swelling, arthralgias and gait problem.  Skin: Negative for rash.  Neurological: Negative for dizziness, syncope, speech difficulty, weakness, numbness and headaches.  Hematological: Negative for adenopathy. Does not bruise/bleed easily.  Psychiatric/Behavioral: Positive for sleep disturbance. Negative for behavioral problems and dysphoric mood. The patient is not nervous/anxious.        Objective:   Physical Exam  Constitutional: He is oriented to person, place, and time. He appears well-developed.  HENT:  Head: Normocephalic.  Right Ear: External ear normal.  Left Ear: External ear normal.  Eyes: Conjunctivae and EOM are  normal.  Neck: Normal range of motion.  Cardiovascular: Normal rate and normal heart sounds.   Pulmonary/Chest: Breath sounds normal.  Abdominal: Bowel sounds are normal.  Musculoskeletal: Normal range of motion. He exhibits no edema and no tenderness.  Neurological: He is alert and oriented to person, place, and time.  Psychiatric: He has a normal mood and affect. His behavior is normal.          Assessment & Plan:   Insomnia. Will give a call in trazodone COPD/allergic rhinitis well controlled Hypertension stable Coronary artery disease. Asymptomatic

## 2010-11-09 NOTE — Patient Instructions (Signed)
Limit your sodium (Salt) intake    It is important that you exercise regularly, at least 20 minutes 3 to 4 times per week.  If you develop chest pain or shortness of breath seek  medical attention. 

## 2010-11-10 LAB — HEMOGLOBIN A1C: Hgb A1c MFr Bld: 6.8 % — ABNORMAL HIGH (ref 4.6–6.5)

## 2010-11-22 NOTE — Op Note (Signed)
NAME:  Christopher Dalton, Christopher Dalton NO.:  [id number]   MEDICAL RECORD NO.:  [id number]          PATIENT TYPE:  AMB   LOCATION:  DAY                          FACILITY:  [facility]   PHYSICIAN:  Anselm Pancoast. Weatherly, M.D.DATE OF BIRTH:  19-Jul-1940   DATE OF PROCEDURE:  05/13/2007  DATE OF DISCHARGE:                               OPERATIVE REPORT   PREOPERATIVE DIAGNOSIS:  Right inguinal hernia.   POSTOPERATIVE DIAGNOSES:  Right inguinal hernia, sort of pantaloon  direct and indirect components.   PROCEDURE:  Right inguinal herniorrhaphy with mesh reinforcement.   ANESTHESIA:  General anesthesia.   SURGEON:  Anselm Pancoast. Zachery Dakins, M.D.   HISTORY:  Christopher Dalton is a 70 year old Caucasian male who was referred  to me by Dr. Amador Cunas for a symptomatic right inguinal hernia.  The  patient has had a previous problem with sinuses and facial fracture  years ago and has had numerous procedures and antibiotics and listed he  is allergic to all antibiotics; I am not sure whether he definitely is  allergic to all antibiotics, but as far as trying to pick something that  he could take, since we are planning to place mesh, was difficult.  Otherwise, he is in recently good health.  The hernia is a lemon-size  hernia.  He has been wearing a truss recently and I recommended that we  repair this with mesh reinforcement.  I picked Cipro since he said he  kind of itches and maybe has a for a rash on some occasions and I gave  him only 1 dose, 400 mg intravenously.  The patient then was induced  with general anesthesia, endotracheal tube.  The abdomen was clipped and  then prepped with Betadine solution and draped in a sterile manner.  You  could feel the definite bulge in the right groin and sharp dissection  through the skin and subcutaneous tissue.  The superficial vein x2 was  clamped, divided and ligated with fine Vicryl and then the external  oblique aponeurosis was identified.  I opened it  through the external  ring and then used a Weitlaner and then elevated the cord structures  over a Penrose drain.  You could see an indirect component of a fairly  good-size hernia sac protruding down.  There was a lot of adipose tissue  in the cord structures that was kind of carefully separated and then I  opened the internal sac.  There was incarcerated omentum within it and  you could actually see the appendix that would float in and out of the  sac.  I reduced this into the intraperitoneal space and then freed the  peritoneum circumferentially so I could do a high sac ligation under  direct vision.  I used 2-0 Vicryl for this and then a second suture just  distal and then removed hernia sac.  He has also got an indirect  component and I freed this up so I could see the edge of the inguinal  ligament and the conjoined tendon nicely and then close this with a  running 2-0 Prolene.  I went  back on the second layer and kind of tied  the 2 ends together; this reinforces the internal ring and then I used a  piece of Prolene mesh like a sail slit laterally at the start of the  symphysis pubis; the inferior limb was sutured with running 2-0 Prolene  to the inguinal ligament and 2 tails were placed around the internal  ring and they were placed under the ilioinguinal and iliohypogastric  nerves and lying flat.  The 2 tails laterally was sutured together and  then some interrupted 2-0 Prolenes were used on the superior flap to  make sure it was lying flat and it is not under excessive tension.  Next, the anesthetic was used and I used 0.5% Marcaine with adrenalin  and I had placed about 5 mL in the ilioinguinal nerve area at the first  with a blunted 22-gauge needle and I then used and 25-gauge needle to  anesthetize the floor and superiorly.  All total, about 25 mL of this  solution were used.  Next, the external oblique aponeurosis was closed  with a running 3-0 Vicryl and then the Scarpa  fascia was closed with  interrupted 3-0 Vicryl; 4-0 Dexon was used subcuticular and  Benzoin and Steri-Strips were used on the skin.  The testicle was in its  normal position.  There was minimal bleeding and we will let the patient  decide whether he wanted to be discharged today or possibly spend the  night and go home in the morning.  I would like for him to avoid before  going home, but he should not have any problems.           ______________________________  Anselm Pancoast. Zachery Dakins, M.D.     WJW/MEDQ  D:  05/13/2007  T:  05/14/2007  Job:  [id number]   cc:   Gordy Savers, MD  [address] [place]  [place] [address]

## 2011-01-09 ENCOUNTER — Other Ambulatory Visit: Payer: Self-pay | Admitting: Internal Medicine

## 2011-01-10 ENCOUNTER — Other Ambulatory Visit: Payer: Self-pay

## 2011-01-10 DIAGNOSIS — F419 Anxiety disorder, unspecified: Secondary | ICD-10-CM

## 2011-01-10 MED ORDER — DIAZEPAM 5 MG PO TABS: 5.0000 mg | ORAL_TABLET | Freq: Every evening | ORAL | Status: DC | PRN

## 2011-01-10 NOTE — Telephone Encounter (Signed)
[facility] 

## 2011-02-09 ENCOUNTER — Encounter: Payer: Self-pay | Admitting: Internal Medicine

## 2011-02-09 ENCOUNTER — Ambulatory Visit (INDEPENDENT_AMBULATORY_CARE_PROVIDER_SITE_OTHER): Payer: Medicare Other | Admitting: Internal Medicine

## 2011-02-09 DIAGNOSIS — M545 Low back pain, unspecified: Secondary | ICD-10-CM

## 2011-02-09 DIAGNOSIS — I10 Essential (primary) hypertension: Secondary | ICD-10-CM

## 2011-02-09 DIAGNOSIS — J449 Chronic obstructive pulmonary disease, unspecified: Secondary | ICD-10-CM

## 2011-02-09 DIAGNOSIS — F411 Generalized anxiety disorder: Secondary | ICD-10-CM

## 2011-02-09 DIAGNOSIS — J4489 Other specified chronic obstructive pulmonary disease: Secondary | ICD-10-CM

## 2011-02-09 DIAGNOSIS — F419 Anxiety disorder, unspecified: Secondary | ICD-10-CM

## 2011-02-09 DIAGNOSIS — E739 Lactose intolerance, unspecified: Secondary | ICD-10-CM

## 2011-02-09 DIAGNOSIS — M1990 Unspecified osteoarthritis, unspecified site: Secondary | ICD-10-CM

## 2011-02-09 MED ORDER — PREDNISONE 5 MG PO TABS: 5.0000 mg | ORAL_TABLET | Freq: Every day | ORAL | Status: DC

## 2011-02-09 MED ORDER — BENAZEPRIL-HYDROCHLOROTHIAZIDE 20-12.5 MG PO TABS: 1.0000 | ORAL_TABLET | Freq: Every day | ORAL | Status: DC

## 2011-02-09 MED ORDER — HYDROCODONE-ACETAMINOPHEN 7.5-750 MG PO TABS: 1.0000 | ORAL_TABLET | Freq: Four times a day (QID) | ORAL | Status: DC | PRN

## 2011-02-09 MED ORDER — DIAZEPAM 5 MG PO TABS: 5.0000 mg | ORAL_TABLET | Freq: Every evening | ORAL | Status: DC | PRN

## 2011-02-09 NOTE — Patient Instructions (Signed)
It is important that you exercise regularly, at least 20 minutes 3 to 4 times per week.  If you develop chest pain or shortness of breath seek  medical attention.  Limit your sodium (Salt) intake  Annual  exam in 3-4 months

## 2011-02-09 NOTE — Progress Notes (Signed)
  Subjective:    Patient ID: Christopher Dalton, male    DOB: 02-22-1941, 70 y.o.   MRN: [id number]  HPI  is a 70 year old patient who is in today for followup. He has treated hypertension which has been stable on his present regimen. He feels well today without concerns or complaints. He has a history of COPD which has been well controlled on low-dose prednisone 5 mg daily. He has not done well on inhalational medications. He states that he has failed trials on lower doses of prednisone He has chronic low back pain and is increased in a increase in his prescription from 90-120 per month. He has been on 120 in the past. He has a history of impaired glucose tolerance    Review of Systems  Constitutional: Negative for fever, chills, appetite change and fatigue.  HENT: Negative for hearing loss, ear pain, congestion, sore throat, trouble swallowing, neck stiffness, dental problem, voice change and tinnitus.   Eyes: Negative for pain, discharge and visual disturbance.  Respiratory: Negative for cough, chest tightness, wheezing and stridor.   Cardiovascular: Negative for chest pain, palpitations and leg swelling.  Gastrointestinal: Negative for nausea, vomiting, abdominal pain, diarrhea, constipation, blood in stool and abdominal distention.  Genitourinary: Negative for urgency, hematuria, flank pain, discharge, difficulty urinating and genital sores.  Musculoskeletal: Positive for back pain. Negative for myalgias, joint swelling, arthralgias and gait problem.  Skin: Negative for rash.  Neurological: Negative for dizziness, syncope, speech difficulty, weakness, numbness and headaches.  Hematological: Negative for adenopathy. Does not bruise/bleed easily.  Psychiatric/Behavioral: Negative for behavioral problems and dysphoric mood. The patient is not nervous/anxious.        Objective:   Physical Exam  Constitutional: He is oriented to person, place, and time. He appears well-developed.  HENT:    Head: Normocephalic.  Right Ear: External ear normal.  Left Ear: External ear normal.  Eyes: Conjunctivae and EOM are normal.  Neck: Normal range of motion.  Cardiovascular: Normal rate and normal heart sounds.   Pulmonary/Chest: Breath sounds normal.  Abdominal: Bowel sounds are normal.  Musculoskeletal: Normal range of motion. He exhibits no edema and no tenderness.  Neurological: He is alert and oriented to person, place, and time.  Psychiatric: He has a normal mood and affect. His behavior is normal.          Assessment & Plan:   Hypertension. Well controlled we'll continue present regimen Impaired glucose tolerance. We'll check a random blood sugar COPD stable Chronic low back pain   We'll see in 3-4 months for his annual exam

## 2011-04-07 ENCOUNTER — Other Ambulatory Visit: Payer: Self-pay | Admitting: *Deleted

## 2011-04-07 NOTE — Telephone Encounter (Signed)
Pt is asking for a early refill on Hydrocodone as he is going out of town this weekend.

## 2011-04-07 NOTE — Telephone Encounter (Signed)
[facility] - [facility] 02/09/11 #120 2 RF - pt has already used all RF's avilb. Not due until mid oct.  They/he is requesting early RF because he is going out of town , I explained Dr. Amador Cunas out of town , and no other doctor would on early REFILL. Instruct pt to call me on Monday from where every he is out of town and I will discuss with dr. Amador Cunas.   Forward to Dr. Amador Cunas - please advise

## 2011-04-10 NOTE — Telephone Encounter (Signed)
Ok to RF? 

## 2011-04-10 NOTE — Telephone Encounter (Signed)
Have not recv'd call from pt or [facility]

## 2011-04-19 LAB — CBC
Hemoglobin: 14.4
MCHC: 33.7
Platelets: 276
RDW: 13.4
WBC: 11.1 — ABNORMAL HIGH

## 2011-04-19 LAB — COMPREHENSIVE METABOLIC PANEL
ALT: 38
AST: 31
BUN: 11
Calcium: 9.7
GFR calc non Af Amer: >60
Total Bilirubin: 0.8
Total Protein: 6.5

## 2011-04-19 LAB — DIFFERENTIAL
Eosinophils Relative: 2
Monocytes Absolute: 0.7
Neutrophils Relative %: 80 — ABNORMAL HIGH

## 2011-06-12 ENCOUNTER — Ambulatory Visit (INDEPENDENT_AMBULATORY_CARE_PROVIDER_SITE_OTHER): Payer: Medicare Other | Admitting: Internal Medicine

## 2011-06-12 ENCOUNTER — Encounter: Payer: Self-pay | Admitting: Internal Medicine

## 2011-06-12 DIAGNOSIS — R7309 Other abnormal glucose: Secondary | ICD-10-CM

## 2011-06-12 DIAGNOSIS — M1990 Unspecified osteoarthritis, unspecified site: Secondary | ICD-10-CM

## 2011-06-12 DIAGNOSIS — F411 Generalized anxiety disorder: Secondary | ICD-10-CM

## 2011-06-12 DIAGNOSIS — J449 Chronic obstructive pulmonary disease, unspecified: Secondary | ICD-10-CM

## 2011-06-12 DIAGNOSIS — Z0000 Encounter for general adult medical examination without abnormal findings: Secondary | ICD-10-CM

## 2011-06-12 DIAGNOSIS — I2510 Atherosclerotic heart disease of native coronary artery without angina pectoris: Secondary | ICD-10-CM

## 2011-06-12 DIAGNOSIS — F419 Anxiety disorder, unspecified: Secondary | ICD-10-CM

## 2011-06-12 DIAGNOSIS — I10 Essential (primary) hypertension: Secondary | ICD-10-CM

## 2011-06-12 DIAGNOSIS — E739 Lactose intolerance, unspecified: Secondary | ICD-10-CM

## 2011-06-12 DIAGNOSIS — Z23 Encounter for immunization: Secondary | ICD-10-CM

## 2011-06-12 DIAGNOSIS — R7302 Impaired glucose tolerance (oral): Secondary | ICD-10-CM

## 2011-06-12 LAB — COMPREHENSIVE METABOLIC PANEL
ALT: 30 U/L (ref 0–53)
CO2: 30 mEq/L (ref 19–32)
Chloride: 100 mEq/L (ref 96–112)
Glucose, Bld: 118 mg/dL — ABNORMAL HIGH (ref 70–99)
Total Bilirubin: 0.5 mg/dL (ref 0.3–1.2)
Total Protein: 7.5 g/dL (ref 6.0–8.3)

## 2011-06-12 LAB — CBC WITH DIFFERENTIAL/PLATELET
Basophils Absolute: 0 10*3/uL (ref 0.0–0.1)
Basophils Relative: 0.3 % (ref 0.0–3.0)
Eosinophils Absolute: 0.1 10*3/uL (ref 0.0–0.7)
Eosinophils Relative: 0.6 % (ref 0.0–5.0)
HCT: 44.5 % (ref 39.0–52.0)
Hemoglobin: 14.8 g/dL (ref 13.0–17.0)
Lymphocytes Relative: 11.8 % — ABNORMAL LOW (ref 12.0–46.0)
Lymphs Abs: 1.4 10*3/uL (ref 0.7–4.0)
MCHC: 33.3 g/dL (ref 30.0–36.0)
MCV: 88.9 fl (ref 78.0–100.0)
Monocytes Absolute: 0.9 10*3/uL (ref 0.1–1.0)
Monocytes Relative: 7.2 % (ref 3.0–12.0)
Neutro Abs: 9.5 10*3/uL — ABNORMAL HIGH (ref 1.4–7.7)
Neutrophils Relative %: 80.1 % — ABNORMAL HIGH (ref 43.0–77.0)
Platelets: 276 10*3/uL (ref 150.0–400.0)
RBC: 5 Mil/uL (ref 4.22–5.81)
RDW: 14.3 % (ref 11.5–14.6)
WBC: 11.9 10*3/uL — ABNORMAL HIGH (ref 4.5–10.5)

## 2011-06-12 LAB — LIPID PANEL: VLDL: 24.4 mg/dL (ref 0.0–40.0)

## 2011-06-12 LAB — TSH: TSH: 0.45 u[IU]/mL (ref 0.35–5.50)

## 2011-06-12 LAB — GLUCOSE, POCT (MANUAL RESULT ENTRY): POC Glucose: 147

## 2011-06-12 LAB — HEMOGLOBIN A1C: Hgb A1c MFr Bld: 6.7 % — ABNORMAL HIGH (ref 4.6–6.5)

## 2011-06-12 MED ORDER — HYDROCODONE-ACETAMINOPHEN 7.5-750 MG PO TABS: 1.0000 | ORAL_TABLET | Freq: Four times a day (QID) | ORAL | Status: DC | PRN

## 2011-06-12 MED ORDER — DIAZEPAM 5 MG PO TABS: 5.0000 mg | ORAL_TABLET | Freq: Every evening | ORAL | Status: DC | PRN

## 2011-06-12 MED ORDER — PREDNISONE 5 MG PO TABS: 5.0000 mg | ORAL_TABLET | Freq: Every day | ORAL | Status: DC

## 2011-06-12 MED ORDER — BENAZEPRIL-HYDROCHLOROTHIAZIDE 20-12.5 MG PO TABS: 1.0000 | ORAL_TABLET | Freq: Every day | ORAL | Status: DC

## 2011-06-12 NOTE — Progress Notes (Signed)
Subjective:    Patient ID: Christopher Dalton, male    DOB: [birth year], 70 y.o.   MRN: [id number]  HPI History of Present Illness:   70 year-old patient who is seen today in for a comprehensive evaluation. Medical problems include hypertension impaired glucose tolerance, COPD. His main complaint today is insomnia and mild depression. This has been present for about 6 months. He also has chronic low back pain.   EKG was reviewed today and revealed some more prominent inferior ST-T wave changes. No cardiopulmonary complaints. No change in EKG  Here for Medicare AWV:   1. Risk factors based on Past M, S, F history: gross factors include hypertension, and history of tobacco use.  2. Physical Activities: limited due to low back pain  3. Depression/mood: history depression, and insomnia for the past 6 months  4. Hearing: mild impairment with history of 70 at this  5. ADL's: totally independent  6. Fall Risk: minimal  7. Home Safety: no issues identified  8. Height, weight, &visual acuity: stable no difficulty with visual acuity  9. Counseling: exercise regimen discussed  10. Labs ordered based on risk factors: will check a random blood sugar. Laboratory panel was checked last fall  11. Referral Coordination- declines screening colonoscopy we'll check for FOB  12. Care Plan- will place on antidepressant medication  13. Cognitive Assessment- no problems identified. accompanied by his wife who has no concerns about cognitive dysfunction   Allergies:  1) Amoxicillin (Amoxicillin)  2) Sulfamethoxazole (Sulfamethoxazole)   Past History:  Past Medical History:  COPD  Hypertension  Low back pain  Osteoarthritis  Allergic rhinitis  impaired glucose tolerance  Depression   Past Surgical History:  Reviewed history from 03/02/2008 and no changes required.  Tonsillectomy  Reconstructive surgery/face  status post right inguinal hernia 2008  declined screening colonoscopy   Family History:    Reviewed history from 07/05/2007 and no changes required.  fatherr died of cancer of unclear type  mother died of lung cancer  no siblings   Social History:  Reviewed history from 03/02/2008 and no changes required.  former smoker       Review of Systems  Constitutional: Negative for fever, chills, activity change, appetite change and fatigue.  HENT: Negative for hearing loss, ear pain, congestion, rhinorrhea, sneezing, mouth sores, trouble swallowing, neck pain, neck stiffness, dental problem, voice change, sinus pressure and tinnitus.   Eyes: Negative for photophobia, pain, redness and visual disturbance.  Respiratory: Negative for apnea, cough, choking, chest tightness, shortness of breath and wheezing.   Cardiovascular: Negative for chest pain, palpitations and leg swelling.  Gastrointestinal: Negative for nausea, vomiting, abdominal pain, diarrhea, constipation, blood in stool, abdominal distention, anal bleeding and rectal pain.  Genitourinary: Positive for frequency. Negative for dysuria, urgency, hematuria, flank pain, decreased urine volume, discharge, penile swelling, scrotal swelling, difficulty urinating, genital sores and testicular pain.  Musculoskeletal: Positive for back pain. Negative for myalgias, joint swelling, arthralgias and gait problem.  Skin: Negative for color change, rash and wound.  Neurological: Negative for dizziness, tremors, seizures, syncope, facial asymmetry, speech difficulty, weakness, light-headedness, numbness and headaches.  Hematological: Negative for adenopathy. Does not bruise/bleed easily.  Psychiatric/Behavioral: Negative for suicidal ideas, hallucinations, behavioral problems, confusion, sleep disturbance, self-injury, dysphoric mood, decreased concentration and agitation. The patient is not nervous/anxious.        Objective:   Physical Exam  Constitutional: He appears well-developed and well-nourished.       Blood pressure 140/80  HENT:  Head: Normocephalic and atraumatic.  Right Ear: External ear normal.  Left Ear: External ear normal.  Nose: Nose normal.  Mouth/Throat: Oropharynx is clear and moist.  Eyes: Conjunctivae and EOM are normal. Pupils are equal, round, and reactive to light. No scleral icterus.  Neck: Normal range of motion. Neck supple. No JVD present. No thyromegaly present.  Cardiovascular: Normal rate, regular rhythm and normal heart sounds.  Exam reveals no gallop and no friction rub.   No murmur heard.      Posterior tibia pulses full. Dorsalis pedis pulse is faint  Pulmonary/Chest: Effort normal and breath sounds normal. He exhibits no tenderness.  Abdominal: Soft. Bowel sounds are normal. He exhibits no distension and no mass. There is no tenderness.  Genitourinary: Prostate normal and penis normal.  Musculoskeletal: Normal range of motion. He exhibits no edema and no tenderness.  Lymphadenopathy:    He has no cervical adenopathy.  Neurological: He is alert. He has normal reflexes. No cranial nerve deficit. Coordination normal.       Decreased vibratory sensation distally. Monofilament testing intact  Skin: Skin is warm and dry. No rash noted.  Psychiatric: He has a normal mood and affect. His behavior is normal.          Assessment & Plan:   Preventive health examination. Declines colonoscopy. We'll give slides  to check for FOB  COPD. Patient is stable on low-dose prednisone. He has not done well on inhalational medications and he has had a difficult time affording. Although not ideal situation we'll continue Impaired glucose tolerance. Fasting blood sugar today 147. We'll check a hemoglobin A1c. Lifestyle issues discussed. May need metformin therapy if hemoglobin A1c is greater than 6.5 Hypertension stable DJD and chronic low back pain. Medications refilled

## 2011-06-12 NOTE — Patient Instructions (Signed)
It is important that you exercise regularly, at least 20 minutes 3 to 4 times per week.  If you develop chest pain or shortness of breath seek  medical attention.  Return in 6 months for follow-up  

## 2011-06-13 LAB — LDL CHOLESTEROL, DIRECT: Direct LDL: 129.5 mg/dL

## 2011-09-04 ENCOUNTER — Telehealth: Payer: Self-pay | Admitting: Internal Medicine

## 2011-09-04 DIAGNOSIS — R0602 Shortness of breath: Secondary | ICD-10-CM

## 2011-09-04 NOTE — Telephone Encounter (Signed)
spke with pt- informed order done and terri will call once info goes in and it is scheduled

## 2011-09-04 NOTE — Telephone Encounter (Signed)
Last seen 12.3.12 - please advise about sleep apnea  Prednisone should have pleanty of RF avilb. - will check

## 2011-09-04 NOTE — Telephone Encounter (Signed)
Please schedule a sleep study.

## 2011-09-04 NOTE — Telephone Encounter (Signed)
Needs recommendation for sleep apnea//. Needs refill for predniSONE (DELTASONE) 5 MG tablet

## 2011-09-04 NOTE — Telephone Encounter (Signed)
Spoke with pt-  R/t sleep apnea request - not sleeping , waking ???SOB  Please advise

## 2011-09-25 ENCOUNTER — Ambulatory Visit (HOSPITAL_BASED_OUTPATIENT_CLINIC_OR_DEPARTMENT_OTHER): Payer: Medicare Other | Attending: Internal Medicine

## 2011-09-25 VITALS — Ht 76.0 in | Wt 200.0 lb

## 2011-09-25 DIAGNOSIS — G4710 Hypersomnia, unspecified: Secondary | ICD-10-CM | POA: Insufficient documentation

## 2011-09-25 DIAGNOSIS — G4733 Obstructive sleep apnea (adult) (pediatric): Secondary | ICD-10-CM

## 2011-09-25 DIAGNOSIS — R0602 Shortness of breath: Secondary | ICD-10-CM

## 2011-09-30 DIAGNOSIS — G4710 Hypersomnia, unspecified: Secondary | ICD-10-CM

## 2011-09-30 DIAGNOSIS — G4730 Sleep apnea, unspecified: Secondary | ICD-10-CM

## 2011-09-30 NOTE — Procedures (Signed)
NAME:  Christopher Dalton, Christopher Dalton NO.:  [id number]  MEDICAL RECORD NO.:  [id number]          PATIENT TYPE:  OUT  LOCATION:  SLEEP CENTER                 FACILITY[facility]  [facility]  PHYSICIAN:  Barbaraann Share, MD,FCCPDATE OF BIRTH:  Oct 29[birth year] 1938  DATE OF STUDY:  09/25/2011                           NOCTURNAL POLYSOMNOGRAM  REFERRING PHYSICIAN:  Gordy Savers, MD  INDICATION FOR STUDY:  Hypersomnia with sleep apnea.  EPWORTH SLEEPINESS SCORE:  1.  MEDICATIONS:  SLEEP ARCHITECTURE:  The patient had a total sleep time of 133 minutes with no slow-wave sleep and only 15 minutes of REM. Sleep onset latency was normal at 30 minutes, and REM onset was prolonged at 184 minutes. Sleep efficiency was very poor at 36%.  RESPIRATORY DATA:  The patient was found to have no obstructive apneas and only 2 obstructive hypopneas, giving him an apnea-hypopnea index of 0.9 events per hour.  The events occurred only in the supine position and there was mild snoring noted throughout.  OXYGEN DATA:  The patient had O2 desaturation as low as 88% with the patient's obstructive events.  CARDIAC DATA:  Rare PACs were noted, but no clinically significant arrhythmias were seen.  MOVEMENT-PARASOMNIA:  The patient had no significant leg jerks or other abnormal behavior seen.  IMPRESSIONS-RECOMMENDATIONS: 1. Small numbers of obstructive events, which do not meet the AHI     criteria for the obstructive sleep apnea syndrome.  The patient did     have a total sleep time of only 133 minutes, however, had enough     sleep to exhibit clinically significant obstructive sleep apnea.     If the patient's clinical presentation suggest more significant     sleep-disordered breathing, consideration could be given to a home     sleep testing where the patient may have a     longer total sleep time where he is comfortable. 2. Rare PAC noted, but no clinically significant arrhythmias were     seen.     Barbaraann Share, MD,FCCP Diplomate, American Board of Sleep Medicine    [facility]/MEDQ  D:  09/30/2011 19:07:38  T:  09/30/2011 19:21:46  Job:  [id number]

## 2011-10-04 ENCOUNTER — Telehealth: Payer: Self-pay | Admitting: Internal Medicine

## 2011-10-04 NOTE — Telephone Encounter (Signed)
Pt needs sleep apnea results also refill on hydrocodone [facility] [address]

## 2011-10-04 NOTE — Telephone Encounter (Signed)
Last filled 06/12/11 # 120 3RF  Last seen 06/12/11 Sleep study was done 09/25/11

## 2011-10-05 NOTE — Telephone Encounter (Signed)
Ok to rf 10-11-11

## 2011-10-09 ENCOUNTER — Telehealth: Payer: Self-pay | Admitting: Internal Medicine

## 2011-10-09 NOTE — Telephone Encounter (Signed)
This is under note tab - date 09/25/11 - please advise

## 2011-10-09 NOTE — Telephone Encounter (Signed)
Patient called stating that he would like a call back with his sleep results. Please assist.

## 2011-10-09 NOTE — Telephone Encounter (Signed)
Spoke with pt- informed of results

## 2011-10-09 NOTE — Telephone Encounter (Signed)
Please call/notify patient that lab/test/procedure is normal 

## 2011-10-11 ENCOUNTER — Other Ambulatory Visit: Payer: Self-pay

## 2011-10-11 DIAGNOSIS — M1990 Unspecified osteoarthritis, unspecified site: Secondary | ICD-10-CM

## 2011-10-11 MED ORDER — HYDROCODONE-ACETAMINOPHEN 7.5-750 MG PO TABS: 1.0000 | ORAL_TABLET | Freq: Four times a day (QID) | ORAL | Status: DC | PRN

## 2011-10-11 NOTE — Telephone Encounter (Signed)
Called in pain med 

## 2011-11-10 ENCOUNTER — Ambulatory Visit (INDEPENDENT_AMBULATORY_CARE_PROVIDER_SITE_OTHER): Payer: Medicare Other | Admitting: Internal Medicine

## 2011-11-10 ENCOUNTER — Encounter: Payer: Self-pay | Admitting: Internal Medicine

## 2011-11-10 VITALS — BP 180/80 | Temp 98.6°F | Wt 204.0 lb

## 2011-11-10 DIAGNOSIS — E739 Lactose intolerance, unspecified: Secondary | ICD-10-CM

## 2011-11-10 DIAGNOSIS — J4489 Other specified chronic obstructive pulmonary disease: Secondary | ICD-10-CM

## 2011-11-10 DIAGNOSIS — J449 Chronic obstructive pulmonary disease, unspecified: Secondary | ICD-10-CM

## 2011-11-10 DIAGNOSIS — R7309 Other abnormal glucose: Secondary | ICD-10-CM

## 2011-11-10 DIAGNOSIS — I10 Essential (primary) hypertension: Secondary | ICD-10-CM

## 2011-11-10 DIAGNOSIS — R7302 Impaired glucose tolerance (oral): Secondary | ICD-10-CM

## 2011-11-10 DIAGNOSIS — G4700 Insomnia, unspecified: Secondary | ICD-10-CM

## 2011-11-10 DIAGNOSIS — F411 Generalized anxiety disorder: Secondary | ICD-10-CM

## 2011-11-10 DIAGNOSIS — F419 Anxiety disorder, unspecified: Secondary | ICD-10-CM

## 2011-11-10 DIAGNOSIS — M1990 Unspecified osteoarthritis, unspecified site: Secondary | ICD-10-CM

## 2011-11-10 LAB — GLUCOSE, POCT (MANUAL RESULT ENTRY): POC Glucose: 178

## 2011-11-10 MED ORDER — HYDROCODONE-ACETAMINOPHEN 7.5-750 MG PO TABS: 1.0000 | ORAL_TABLET | Freq: Four times a day (QID) | ORAL | Status: DC | PRN

## 2011-11-10 MED ORDER — TRAZODONE HCL 50 MG PO TABS: 25.0000 mg | ORAL_TABLET | Freq: Every evening | ORAL | Status: DC | PRN

## 2011-11-10 MED ORDER — METFORMIN HCL ER 500 MG PO TB24: 500.0000 mg | ORAL_TABLET | Freq: Every day | ORAL | Status: DC

## 2011-11-10 MED ORDER — DIAZEPAM 5 MG PO TABS: 5.0000 mg | ORAL_TABLET | Freq: Every evening | ORAL | Status: DC | PRN

## 2011-11-10 NOTE — Progress Notes (Signed)
  Subjective:    Patient ID: Christopher Dalton, male    DOB: 1940/10/03, 70 y.o.   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 Medical problems include the hypertension. He does monitor blood pressure readings at home with usually good readings there has been some issues with hypotension in the past more easily has had some significant insomnia for the past several months. He states that his mind races and he has a difficult time going to sleep in spite of a at bedtime dose of diazepam. He has osteoarthritis but this does not seem to be a factor. He has COPD he states that he has noted his pulse rate running a bit higher of late. He does have a history of impaired glucose tolerance.  Random blood sugar today 178 hemoglobin A1c's have been elevated but less than 7. Regimen includes low-dose prednisone therapy for his COPD.  BP Readings from Last 3 Encounters:  11/10/11 180/80  06/12/11 132/80  02/09/11 140/80    Review of Systems  Constitutional: Negative for fever, chills, appetite change and fatigue.  HENT: Negative for hearing loss, ear pain, congestion, sore throat, trouble swallowing, neck stiffness, dental problem, voice change and tinnitus.   Eyes: Negative for pain, discharge and visual disturbance.  Respiratory: Positive for shortness of breath (DOe). Negative for cough, chest tightness, wheezing and stridor.   Cardiovascular: Negative for chest pain, palpitations and leg swelling.  Gastrointestinal: Negative for nausea, vomiting, abdominal pain, diarrhea, constipation, blood in stool and abdominal distention.  Genitourinary: Negative for urgency, hematuria, flank pain, discharge, difficulty urinating and genital sores.  Musculoskeletal: Positive for arthralgias. Negative for myalgias, back pain, joint swelling and gait problem.  Skin: Negative for rash.  Neurological: Negative for dizziness, syncope, speech difficulty, weakness, numbness and  headaches.  Hematological: Negative for adenopathy. Does not bruise/bleed easily.  Psychiatric/Behavioral: Positive for sleep disturbance. Negative for behavioral problems and dysphoric mood. The patient is not nervous/anxious.        Objective:   Physical Exam  Constitutional: He is oriented to person, place, and time. He appears well-developed.       Blood pressure 160/80 right arm 150/75 left arm  HENT:  Head: Normocephalic.  Right Ear: External ear normal.  Left Ear: External ear normal.  Eyes: Conjunctivae and EOM are normal.  Neck: Normal range of motion.  Cardiovascular: Normal rate and normal heart sounds.   Pulmonary/Chest: Breath sounds normal.       O2 saturation 97 Pulse rate 100  Abdominal: Bowel sounds are normal.  Musculoskeletal: Normal range of motion. He exhibits no edema and no tenderness.  Neurological: He is alert and oriented to person, place, and time.  Psychiatric: He has a normal mood and affect. His behavior is normal.          Assessment & Plan:   Hypertension will observe at the present regimen and follow more closely clinically. We'll treat his insomnia hopefully his blood pressure will improve Insomnia. We'll add trazodone 100 mg at bedtime Diabetes mellitus type 2 we'll place on metformin and titrated to a dose of 1000 mg daily COPD stable Osteoarthritis stable

## 2011-11-10 NOTE — Patient Instructions (Signed)
Limit your sodium (Salt) intake  Please check your blood pressure on a regular basis.  If it is consistently greater than 150/90, please make an office appointment.    It is important that you exercise regularly, at least 20 minutes 3 to 4 times per week.  If you develop chest pain or shortness of breath seek  medical attention.  Return in 3 months for follow-up  

## 2012-02-09 ENCOUNTER — Encounter: Payer: Self-pay | Admitting: Internal Medicine

## 2012-02-09 ENCOUNTER — Ambulatory Visit (INDEPENDENT_AMBULATORY_CARE_PROVIDER_SITE_OTHER): Payer: Medicare Other | Admitting: Internal Medicine

## 2012-02-09 VITALS — BP 126/80 | Temp 98.0°F | Wt 208.0 lb

## 2012-02-09 DIAGNOSIS — R7302 Impaired glucose tolerance (oral): Secondary | ICD-10-CM

## 2012-02-09 DIAGNOSIS — M1990 Unspecified osteoarthritis, unspecified site: Secondary | ICD-10-CM

## 2012-02-09 DIAGNOSIS — R7309 Other abnormal glucose: Secondary | ICD-10-CM

## 2012-02-09 DIAGNOSIS — E739 Lactose intolerance, unspecified: Secondary | ICD-10-CM

## 2012-02-09 DIAGNOSIS — I10 Essential (primary) hypertension: Secondary | ICD-10-CM

## 2012-02-09 DIAGNOSIS — J449 Chronic obstructive pulmonary disease, unspecified: Secondary | ICD-10-CM

## 2012-02-09 MED ORDER — HYDROCODONE-ACETAMINOPHEN 7.5-750 MG PO TABS: 1.0000 | ORAL_TABLET | Freq: Four times a day (QID) | ORAL | Status: DC | PRN

## 2012-02-09 NOTE — Progress Notes (Signed)
  Subjective:    Patient ID: Christopher Dalton, male    DOB: 1940/11/27, 71 y.o.   MRN: [id number]  HPI  Wt Readings from Last 3 Encounters:  02/09/12 208 lb (94.348 kg)  11/10/11 204 lb (92.534 kg)  09/25/11 200 lb (90.719 kg)    Review of Systems     Objective:   Physical Exam        Assessment & Plan:

## 2012-02-09 NOTE — Progress Notes (Signed)
Subjective:    Patient ID: Christopher Dalton, male    DOB: [birth year], 71 y.o.   MRN: [id number]  HPI  71 year old patient who is seen today for followup. He has COPD which has been stable. He has treated hypertension. He has a long history of impaired glucose tolerance. He was placed on metformin last visit the 2 a random blood sugar of approximately 180. Hemoglobin A1c's have been always less than 7. The patient did not start metformin therapy due to the possibility of side effects. He has modified his diet although there has been some modest weight gain this year. A fasting blood sugar this morning 116. He feels well  Past Medical History  Diagnosis Date  . IMPAIRED GLUCOSE TOLERANCE 03/02/2008  . DEPRESSION 11/08/2009  . HYPERTENSION 01/21/2007  . C A D 11/15/2009  . HYPOTENSION 04/13/2009  . COPD 01/21/2007  . INGUINAL HERNIA, RIGHT 03/25/2007  . OSTEOARTHRITIS 01/21/2007  . HIP PAIN, LEFT 10/29/2008  . LOW BACK PAIN 01/21/2007    History   Social History  . Marital Status: Married    Spouse Name: N/A    Number of Children: N/A  . Years of Education: N/A   Occupational History  . Not on file.   Social History Main Topics  . Smoking status: Former Games developer  . Smokeless tobacco: Not on file  . Alcohol Use:   . Drug Use:   . Sexually Active:    Other Topics Concern  . Not on file   Social History Narrative  . No narrative on file    Past Surgical History  Procedure Date  . Tonsillectomy unknown  . Reconstruction mid-face   . Inguinal hernia repair 2008    right    Family History  Problem Relation Age of Onset  . Cancer Mother     lung ca  . Cancer Father     unclear type    Allergies  Allergen Reactions  . Amoxicillin     REACTION: unspecified  . Barbiturates     REACTION: tongue swells, hands swell  . Pravastatin   . Sulfamethoxazole     REACTION: unspecified  . Statins Other (See Comments)    mucsle pain    Current Outpatient Prescriptions on File Prior to  Visit  Medication Sig Dispense Refill  . aspirin 81 MG tablet Take 81 mg by mouth daily.        . benazepril-hydrochlorthiazide (LOTENSIN HCT) 20-12.5 MG per tablet Take 1 tablet by mouth daily.  90 tablet  4  . diazepam (VALIUM) 5 MG tablet Take 1 tablet (5 mg total) by mouth at bedtime as needed.  90 tablet  3  . HYDROcodone-acetaminophen (VICODIN ES) 7.5-750 MG per tablet Take 1 tablet by mouth every 6 (six) hours as needed for pain.  120 tablet  3  . loratadine (CLARITIN) 10 MG tablet Take 10 mg by mouth daily.        . metFORMIN (GLUCOPHAGE XR) 500 MG 24 hr tablet Take 1 tablet (500 mg total) by mouth daily with breakfast.  180 tablet  4  . predniSONE (DELTASONE) 5 MG tablet Take 1 tablet (5 mg total) by mouth daily.  90 tablet  3  . traZODone (DESYREL) 50 MG tablet Take 1 tablet (50 mg total) by mouth at bedtime.  30 tablet  0  . traZODone (DESYREL) 50 MG tablet Take 0.5-1 tablets (25-50 mg total) by mouth at bedtime as needed for sleep.  30 tablet  3  BP 126/80  Temp 98 F (36.7 C) (Oral)  Wt 208 lb (94.348 kg)  SpO2 97%       Review of Systems  Constitutional: Negative for fever, chills, appetite change and fatigue.  HENT: Negative for hearing loss, ear pain, congestion, sore throat, trouble swallowing, neck stiffness, dental problem, voice change and tinnitus.   Eyes: Negative for pain, discharge and visual disturbance.  Respiratory: Negative for cough, chest tightness, wheezing and stridor.   Cardiovascular: Negative for chest pain, palpitations and leg swelling.  Gastrointestinal: Negative for nausea, vomiting, abdominal pain, diarrhea, constipation, blood in stool and abdominal distention.  Genitourinary: Negative for urgency, hematuria, flank pain, discharge, difficulty urinating and genital sores.  Musculoskeletal: Negative for myalgias, back pain, joint swelling, arthralgias and gait problem.  Skin: Negative for rash.  Neurological: Negative for dizziness, syncope,  speech difficulty, weakness, numbness and headaches.  Hematological: Negative for adenopathy. Does not bruise/bleed easily.  Psychiatric/Behavioral: Negative for behavioral problems and dysphoric mood. The patient is not nervous/anxious.        Objective:   Physical Exam  Constitutional: He is oriented to person, place, and time. He appears well-developed.  HENT:  Head: Normocephalic.  Right Ear: External ear normal.  Left Ear: External ear normal.  Eyes: Conjunctivae and EOM are normal.  Neck: Normal range of motion.  Cardiovascular: Normal rate and normal heart sounds.   Pulmonary/Chest: Breath sounds normal. No respiratory distress. He has no wheezes. He has no rales.       Diminished breath sounds but clear  Abdominal: Bowel sounds are normal.  Musculoskeletal: Normal range of motion. He exhibits no edema and no tenderness.  Neurological: He is alert and oriented to person, place, and time.  Psychiatric: He has a normal mood and affect. His behavior is normal.          Assessment & Plan:   COPD clinically stable Impaired glucose tolerance. Lifestyle issues discussed. We'll maintain off metformin therapy. We'll check a hemoglobin A1c Hypertension stable

## 2012-02-09 NOTE — Patient Instructions (Signed)
Limit your sodium (Salt) intake    It is important that you exercise regularly, at least 20 minutes 3 to 4 times per week.  If you develop chest pain or shortness of breath seek  medical attention.  Return in 6 months for follow-up  

## 2012-05-09 ENCOUNTER — Other Ambulatory Visit: Payer: Self-pay | Admitting: Internal Medicine

## 2012-06-04 ENCOUNTER — Other Ambulatory Visit: Payer: Self-pay | Admitting: Internal Medicine

## 2012-06-05 NOTE — Telephone Encounter (Signed)
Spoke to pt told him unable to refill Vicodin until 06/10/2012 it is too early. Pt verbalized understanding.

## 2012-06-05 NOTE — Telephone Encounter (Signed)
Ok to RF on 12-2

## 2012-06-10 MED ORDER — HYDROCODONE-ACETAMINOPHEN 7.5-325 MG PO TABS: 1.0000 | ORAL_TABLET | Freq: Four times a day (QID) | ORAL | Status: DC | PRN

## 2012-06-10 NOTE — Telephone Encounter (Signed)
Left message with wife that Rx was called into pharmacy.

## 2012-06-10 NOTE — Telephone Encounter (Signed)
Tried to contact pt no answer will try again later. 

## 2012-06-10 NOTE — Telephone Encounter (Signed)
Rx called into pharmacy, spoke to [facility] and was told Vicodin 7.5-750 mg was no longer available. Rx changed to Hydrocodone-Acetaminophen 7.5-325 mg.

## 2012-07-09 ENCOUNTER — Other Ambulatory Visit: Payer: Self-pay | Admitting: Internal Medicine

## 2012-07-11 NOTE — Telephone Encounter (Signed)
ok 

## 2012-07-23 ENCOUNTER — Ambulatory Visit (INDEPENDENT_AMBULATORY_CARE_PROVIDER_SITE_OTHER): Payer: Medicare Other | Admitting: Internal Medicine

## 2012-07-23 ENCOUNTER — Encounter: Payer: Self-pay | Admitting: Internal Medicine

## 2012-07-23 VITALS — BP 160/80 | HR 76 | Temp 98.9°F | Resp 18 | Wt 209.0 lb

## 2012-07-23 DIAGNOSIS — I10 Essential (primary) hypertension: Secondary | ICD-10-CM

## 2012-07-23 DIAGNOSIS — R300 Dysuria: Secondary | ICD-10-CM

## 2012-07-23 DIAGNOSIS — M545 Low back pain, unspecified: Secondary | ICD-10-CM

## 2012-07-23 DIAGNOSIS — M1990 Unspecified osteoarthritis, unspecified site: Secondary | ICD-10-CM

## 2012-07-23 DIAGNOSIS — J4489 Other specified chronic obstructive pulmonary disease: Secondary | ICD-10-CM

## 2012-07-23 DIAGNOSIS — Z23 Encounter for immunization: Secondary | ICD-10-CM

## 2012-07-23 DIAGNOSIS — J449 Chronic obstructive pulmonary disease, unspecified: Secondary | ICD-10-CM

## 2012-07-23 DIAGNOSIS — E119 Type 2 diabetes mellitus without complications: Secondary | ICD-10-CM

## 2012-07-23 DIAGNOSIS — E739 Lactose intolerance, unspecified: Secondary | ICD-10-CM

## 2012-07-23 LAB — POCT URINALYSIS DIPSTICK
Bilirubin, UA: NEGATIVE
Glucose, UA: NEGATIVE
Spec Grav, UA: 1.015
Urobilinogen, UA: 0.2

## 2012-07-23 MED ORDER — PREDNISONE 5 MG PO TABS: 5.0000 mg | ORAL_TABLET | Freq: Every day | ORAL | Status: DC

## 2012-07-23 MED ORDER — BENAZEPRIL-HYDROCHLOROTHIAZIDE 20-12.5 MG PO TABS: 1.0000 | ORAL_TABLET | Freq: Every day | ORAL | Status: DC

## 2012-07-23 MED ORDER — DIAZEPAM 5 MG PO TABS: 5.0000 mg | ORAL_TABLET | Freq: Every evening | ORAL | Status: DC | PRN

## 2012-07-23 MED ORDER — HYDROCODONE-ACETAMINOPHEN 7.5-325 MG PO TABS: 1.0000 | ORAL_TABLET | Freq: Four times a day (QID) | ORAL | Status: DC | PRN

## 2012-07-23 NOTE — Patient Instructions (Signed)
Limit your sodium (Salt) intake    It is important that you exercise regularly, at least 20 minutes 3 to 4 times per week.  If you develop chest pain or shortness of breath seek  medical attention. 

## 2012-07-23 NOTE — Progress Notes (Signed)
Subjective:    Patient ID: Christopher Dalton, male    DOB: [birth year], 72 y.o.   MRN: [id number]  HPI  72 year old patient who complains of some dark foul-smelling urine. He has been forcing fluids and this has improved. He also has some occasional nosebleeds. He has COPD hypertension history of impaired glucose tolerance and chronic low back pain. He is on hydrocodone 4 times a day. He is asking about the possibility of oxycodone. He was told that he would need referral to a chronic pain specialist and  this he wishes to consider. Has promised status has been stable. He has a history of impaired glucose tolerance but did not obtain a hemoglobin A1c in the fall. This will be checked today.  Past Medical History  Diagnosis Date  . IMPAIRED GLUCOSE TOLERANCE 03/02/2008  . DEPRESSION 11/08/2009  . HYPERTENSION 01/21/2007  . C A D 11/15/2009  . HYPOTENSION 04/13/2009  . COPD 01/21/2007  . INGUINAL HERNIA, RIGHT 03/25/2007  . OSTEOARTHRITIS 01/21/2007  . HIP PAIN, LEFT 10/29/2008  . LOW BACK PAIN 01/21/2007    History   Social History  . Marital Status: Married    Spouse Name: N/A    Number of Children: N/A  . Years of Education: N/A   Occupational History  . Not on file.   Social History Main Topics  . Smoking status: Former Games developer  . Smokeless tobacco: Not on file  . Alcohol Use:   . Drug Use:   . Sexually Active:    Other Topics Concern  . Not on file   Social History Narrative  . No narrative on file    Past Surgical History  Procedure Date  . Tonsillectomy unknown  . Reconstruction mid-face   . Inguinal hernia repair 2008    right    Family History  Problem Relation Age of Onset  . Cancer Mother     lung ca  . Cancer Father     unclear type    Allergies  Allergen Reactions  . Amoxicillin     REACTION: unspecified  . Barbiturates     REACTION: tongue swells, hands swell  . Pravastatin   . Sulfamethoxazole     REACTION: unspecified  . Statins Other (See Comments)     mucsle pain    Current Outpatient Prescriptions on File Prior to Visit  Medication Sig Dispense Refill  . aspirin 81 MG tablet Take 81 mg by mouth daily.        . benazepril-hydrochlorthiazide (LOTENSIN HCT) 20-12.5 MG per tablet Take 1 tablet by mouth daily.  90 tablet  4  . diazepam (VALIUM) 5 MG tablet TAKE 1 TABLET BY MOUTH AT BEDTIME AS NEEDED  90 tablet  0  . HYDROcodone-acetaminophen (NORCO) 7.5-325 MG per tablet TAKE 1 TABLET BY MOUTH EVERY 6 HOURS AS NEEDED FOR PAIN  120 tablet  0  . loratadine (CLARITIN) 10 MG tablet Take 10 mg by mouth daily.        . predniSONE (DELTASONE) 5 MG tablet Take 1 tablet (5 mg total) by mouth daily.  90 tablet  3    BP 160/80  Pulse 76  Temp 98.9 F (37.2 C) (Oral)  Resp 18  Wt 209 lb (94.802 kg)  SpO2 97%      Review of Systems  Constitutional: Negative for fever, chills, appetite change and fatigue.  HENT: Negative for hearing loss, ear pain, congestion, sore throat, trouble swallowing, neck stiffness, dental problem, voice change and tinnitus.  Eyes: Negative for pain, discharge and visual disturbance.  Respiratory: Negative for cough, chest tightness, wheezing and stridor.   Cardiovascular: Negative for chest pain, palpitations and leg swelling.  Gastrointestinal: Negative for nausea, vomiting, abdominal pain, diarrhea, constipation, blood in stool and abdominal distention.  Genitourinary: Negative for urgency, hematuria, flank pain, discharge, difficulty urinating and genital sores.  Musculoskeletal: Positive for back pain and arthralgias. Negative for myalgias, joint swelling and gait problem.  Skin: Negative for rash.  Neurological: Negative for dizziness, syncope, speech difficulty, weakness, numbness and headaches.  Hematological: Negative for adenopathy. Does not bruise/bleed easily.  Psychiatric/Behavioral: Negative for behavioral problems and dysphoric mood. The patient is not nervous/anxious.        Objective:    Physical Exam  Constitutional: He is oriented to person, place, and time. He appears well-developed.  HENT:  Head: Normocephalic.  Right Ear: External ear normal.  Left Ear: External ear normal.  Eyes: Conjunctivae normal and EOM are normal.  Neck: Normal range of motion.  Cardiovascular: Normal rate and normal heart sounds.   Pulmonary/Chest: Breath sounds normal.  Abdominal: Bowel sounds are normal.       A small ventral hernia just superior to the umbilicus  Musculoskeletal: Normal range of motion. He exhibits no edema and no tenderness.  Neurological: He is alert and oriented to person, place, and time.  Psychiatric: He has a normal mood and affect. His behavior is normal.          Assessment & Plan:  COPD Hypertension. Low-salt diet recommended home blood pressure monitoring recommended we'll reassess in 3 months Impaired glucose tolerance. We'll check a hemoglobin A1c Chronic low back pain. All medications refilled. He will consider referral to a chronic pain specialist

## 2012-08-12 ENCOUNTER — Ambulatory Visit: Payer: Medicare Other | Admitting: Internal Medicine

## 2012-08-19 ENCOUNTER — Ambulatory Visit: Payer: Medicare Other | Admitting: Internal Medicine

## 2012-10-21 ENCOUNTER — Ambulatory Visit: Payer: Medicare Other | Admitting: Internal Medicine

## 2012-10-22 ENCOUNTER — Encounter: Payer: Self-pay | Admitting: Internal Medicine

## 2012-10-22 ENCOUNTER — Ambulatory Visit (INDEPENDENT_AMBULATORY_CARE_PROVIDER_SITE_OTHER): Payer: Medicare Other | Admitting: Internal Medicine

## 2012-10-22 VITALS — BP 160/80 | HR 84 | Temp 98.5°F | Resp 20 | Wt 209.0 lb

## 2012-10-22 DIAGNOSIS — M545 Low back pain, unspecified: Secondary | ICD-10-CM

## 2012-10-22 DIAGNOSIS — M1990 Unspecified osteoarthritis, unspecified site: Secondary | ICD-10-CM

## 2012-10-22 DIAGNOSIS — I10 Essential (primary) hypertension: Secondary | ICD-10-CM

## 2012-10-22 DIAGNOSIS — J4489 Other specified chronic obstructive pulmonary disease: Secondary | ICD-10-CM

## 2012-10-22 DIAGNOSIS — J449 Chronic obstructive pulmonary disease, unspecified: Secondary | ICD-10-CM

## 2012-10-22 DIAGNOSIS — J309 Allergic rhinitis, unspecified: Secondary | ICD-10-CM

## 2012-10-22 MED ORDER — ALBUTEROL SULFATE HFA 108 (90 BASE) MCG/ACT IN AERS: 2.0000 | INHALATION_SPRAY | Freq: Four times a day (QID) | RESPIRATORY_TRACT | Status: DC | PRN

## 2012-10-22 MED ORDER — HYDROCODONE-ACETAMINOPHEN 7.5-325 MG PO TABS: 1.0000 | ORAL_TABLET | Freq: Four times a day (QID) | ORAL | Status: DC | PRN

## 2012-10-22 NOTE — Patient Instructions (Signed)
Limit your sodium (Salt) intake    It is important that you exercise regularly, at least 20 minutes 3 to 4 times per week.  If you develop chest pain or shortness of breath seek  medical attention.  Return in 4 months for follow-up   

## 2012-10-22 NOTE — Progress Notes (Signed)
Subjective:    Patient ID: Christopher Dalton, male    DOB: June 08, 1941, 72 y.o.   MRN: [id number]  HPI  72 year old patient who is seen today for followup. He has a history of COPD which has been managed with low-dose prednisone. He has chronic allergic rhinitis which has been stable. He has only rare wheezing. He has treated hypertension which also has been stable. He has osteoarthritis and chronic low back pain. He requires chronic narcotic use. In general doing fairly well. No recent lab.  Past Medical History  Diagnosis Date  . IMPAIRED GLUCOSE TOLERANCE 03/02/2008  . DEPRESSION 11/08/2009  . HYPERTENSION 01/21/2007  . C A D 11/15/2009  . HYPOTENSION 04/13/2009  . COPD 01/21/2007  . INGUINAL HERNIA, RIGHT 03/25/2007  . OSTEOARTHRITIS 01/21/2007  . HIP PAIN, LEFT 10/29/2008  . LOW BACK PAIN 01/21/2007    History   Social History  . Marital Status: Married    Spouse Name: N/A    Number of Children: N/A  . Years of Education: N/A   Occupational History  . Not on file.   Social History Main Topics  . Smoking status: Former Games developer  . Smokeless tobacco: Not on file  . Alcohol Use:   . Drug Use:   . Sexually Active:    Other Topics Concern  . Not on file   Social History Narrative  . No narrative on file    Past Surgical History  Procedure Laterality Date  . Tonsillectomy  unknown  . Reconstruction mid-face    . Inguinal hernia repair  2008    right    Family History  Problem Relation Age of Onset  . Cancer Mother     lung ca  . Cancer Father     unclear type    Allergies  Allergen Reactions  . Amoxicillin     REACTION: unspecified  . Barbiturates     REACTION: tongue swells, hands swell  . Pravastatin   . Sulfamethoxazole     REACTION: unspecified  . Statins Other (See Comments)    mucsle pain    Current Outpatient Prescriptions on File Prior to Visit  Medication Sig Dispense Refill  . aspirin 81 MG tablet Take 81 mg by mouth daily.        .  benazepril-hydrochlorthiazide (LOTENSIN HCT) 20-12.5 MG per tablet Take 1 tablet by mouth daily.  90 tablet  4  . HYDROcodone-acetaminophen (NORCO) 7.5-325 MG per tablet Take 1 tablet by mouth every 6 (six) hours as needed for pain.  120 tablet  3  . loratadine (CLARITIN) 10 MG tablet Take 10 mg by mouth daily.        . predniSONE (DELTASONE) 5 MG tablet Take 1 tablet (5 mg total) by mouth daily.  90 tablet  3   No current facility-administered medications on file prior to visit.    BP 160/80  Pulse 84  Temp(Src) 98.5 F (36.9 C) (Oral)  Resp 20  Wt 209 lb (94.802 kg)  BMI 25.45 kg/m2  SpO2 97%       Review of Systems  Constitutional: Negative for fever, chills, appetite change and fatigue.  HENT: Negative for hearing loss, ear pain, congestion, sore throat, trouble swallowing, neck stiffness, dental problem, voice change and tinnitus.   Eyes: Negative for pain, discharge and visual disturbance.  Respiratory: Negative for cough, chest tightness, wheezing and stridor.   Cardiovascular: Negative for chest pain, palpitations and leg swelling.  Gastrointestinal: Negative for nausea, vomiting, abdominal pain, diarrhea,  constipation, blood in stool and abdominal distention.  Genitourinary: Negative for urgency, hematuria, flank pain, discharge, difficulty urinating and genital sores.  Musculoskeletal: Negative for myalgias, back pain, joint swelling, arthralgias and gait problem.  Skin: Negative for rash.  Neurological: Negative for dizziness, syncope, speech difficulty, weakness, numbness and headaches.  Hematological: Negative for adenopathy. Does not bruise/bleed easily.  Psychiatric/Behavioral: Negative for behavioral problems and dysphoric mood. The patient is not nervous/anxious.        Objective:   Physical Exam  Constitutional: He is oriented to person, place, and time. He appears well-developed.  Repeat blood pressure 140/70  HENT:  Head: Normocephalic.  Right Ear:  External ear normal.  Left Ear: External ear normal.  Eyes: Conjunctivae and EOM are normal.  Neck: Normal range of motion.  Cardiovascular: Normal rate and normal heart sounds.   Pulmonary/Chest: Breath sounds normal.  Abdominal: Bowel sounds are normal.  Musculoskeletal: Normal range of motion. He exhibits no edema and no tenderness.  Neurological: He is alert and oriented to person, place, and time.  Psychiatric: He has a normal mood and affect. His behavior is normal.          Assessment & Plan:   COPD. Stable we'll give a prescription for when necessary albuterol use. Continue low-dose prednisone which has been quite helpful Hypertension stable Osteoarthritis and chronic low back pain. Handicapped placard  Completed and analgesics refill  CPX 4 months

## 2013-02-21 ENCOUNTER — Ambulatory Visit (INDEPENDENT_AMBULATORY_CARE_PROVIDER_SITE_OTHER): Payer: Medicare Other | Admitting: Internal Medicine

## 2013-02-21 ENCOUNTER — Encounter: Payer: Self-pay | Admitting: Internal Medicine

## 2013-02-21 VITALS — BP 150/70 | HR 81 | Temp 98.6°F | Resp 20 | Ht 75.5 in | Wt 209.0 lb

## 2013-02-21 DIAGNOSIS — E119 Type 2 diabetes mellitus without complications: Secondary | ICD-10-CM

## 2013-02-21 DIAGNOSIS — E739 Lactose intolerance, unspecified: Secondary | ICD-10-CM

## 2013-02-21 DIAGNOSIS — I2510 Atherosclerotic heart disease of native coronary artery without angina pectoris: Secondary | ICD-10-CM

## 2013-02-21 DIAGNOSIS — Z0000 Encounter for general adult medical examination without abnormal findings: Secondary | ICD-10-CM

## 2013-02-21 DIAGNOSIS — M1990 Unspecified osteoarthritis, unspecified site: Secondary | ICD-10-CM

## 2013-02-21 DIAGNOSIS — I10 Essential (primary) hypertension: Secondary | ICD-10-CM

## 2013-02-21 DIAGNOSIS — J449 Chronic obstructive pulmonary disease, unspecified: Secondary | ICD-10-CM

## 2013-02-21 DIAGNOSIS — M545 Low back pain: Secondary | ICD-10-CM

## 2013-02-21 LAB — CBC WITH DIFFERENTIAL/PLATELET
Basophils Absolute: 0 10*3/uL (ref 0.0–0.1)
Eosinophils Absolute: 0.1 10*3/uL (ref 0.0–0.7)
Eosinophils Relative: 0.5 % (ref 0.0–5.0)
MCV: 89.3 fl (ref 78.0–100.0)
Monocytes Absolute: 1 10*3/uL (ref 0.1–1.0)
Neutrophils Relative %: 81.2 % — ABNORMAL HIGH (ref 43.0–77.0)
Platelets: 278 10*3/uL (ref 150.0–400.0)
RDW: 14.3 % (ref 11.5–14.6)
WBC: 13.6 10*3/uL — ABNORMAL HIGH (ref 4.5–10.5)

## 2013-02-21 LAB — MICROALBUMIN / CREATININE URINE RATIO
Creatinine,U: 95.9 mg/dL
Microalb, Ur: 0.7 mg/dL (ref 0.0–1.9)

## 2013-02-21 LAB — LIPID PANEL
LDL Cholesterol: 115 mg/dL — ABNORMAL HIGH (ref 0–99)
VLDL: 21.4 mg/dL (ref 0.0–40.0)

## 2013-02-21 LAB — COMPREHENSIVE METABOLIC PANEL
AST: 33 U/L (ref 0–37)
Albumin: 4.3 g/dL (ref 3.5–5.2)
Alkaline Phosphatase: 55 U/L (ref 39–117)
Calcium: 9.6 mg/dL (ref 8.4–10.5)
Chloride: 100 mEq/L (ref 96–112)
Potassium: 3.8 mEq/L (ref 3.5–5.1)
Sodium: 138 mEq/L (ref 135–145)
Total Protein: 7.4 g/dL (ref 6.0–8.3)

## 2013-02-21 LAB — TSH: TSH: 0.31 u[IU]/mL — ABNORMAL LOW (ref 0.35–5.50)

## 2013-02-21 MED ORDER — HYDROCODONE-ACETAMINOPHEN 7.5-325 MG PO TABS: 1.0000 | ORAL_TABLET | Freq: Four times a day (QID) | ORAL | Status: DC | PRN

## 2013-02-21 MED ORDER — TAMSULOSIN HCL 0.4 MG PO CAPS: 0.4000 mg | ORAL_CAPSULE | Freq: Every day | ORAL | Status: DC

## 2013-02-21 NOTE — Patient Instructions (Addendum)
Limit your sodium (Salt) intake  Attempt to decrease prednisone to 2.5 mg(one half tablet) daily  Schedule your colonoscopy to help detect colon cancer.    Return in 6 months for follow-up

## 2013-02-21 NOTE — Progress Notes (Signed)
Patient ID: OSHA ERRICO, male   DOB: August 13, 1940, 72 y.o.   MRN: [id number]  Subjective:    Patient ID: TIGE MEAS, male    DOB: 05/28/1941, 72 y.o.   MRN: [id number]  HPI History of Present Illness:   72  year-old patient who is seen today in for a comprehensive evaluation. Medical problems include hypertension impaired glucose tolerance, COPD. His main complaint today is insomnia and mild depression. This has been present for about 6 months. He also has chronic low back pain.   EKG was reviewed today and revealed some more prominent inferior ST-T wave changes. No cardiopulmonary complaints. No change in EKG  Here for Medicare AWV:   1. Risk factors based on Past M, S, F history: gross factors include hypertension, and history of tobacco use.  2. Physical Activities: limited due to low back pain  3. Depression/mood: history depression, and insomnia for the past 6 months  4. Hearing: mild impairment with history of 72 at this  5. ADL's: totally independent  6. Fall Risk: minimal  7. Home Safety: no issues identified  8. Height, weight, &visual acuity: stable no difficulty with visual acuity  9. Counseling: exercise regimen discussed  10. Labs ordered based on risk factors: will check a random blood sugar. Laboratory panel was checked last fall  11. Referral Coordination- declines screening colonoscopy we'll check for FOB  12. Care Plan- will place on antidepressant medication  13. Cognitive Assessment- no problems identified. accompanied by his wife who has no concerns about cognitive dysfunction   Allergies:  1) Amoxicillin (Amoxicillin)  2) Sulfamethoxazole (Sulfamethoxazole)   Past History:  Past Medical History:  COPD  Hypertension  Low back pain  Osteoarthritis  Allergic rhinitis  impaired glucose tolerance  Depression   Past Surgical History:  Reviewed history from 03/02/2008 and no changes required.  Tonsillectomy  Reconstructive surgery/face  status post right  inguinal hernia 2008  declined screening colonoscopy   Family History:  Reviewed history from 07/05/2007 and no changes required.  fatherr died of cancer of unclear type  mother died of lung cancer  no siblings   Social History:  Reviewed history from 03/02/2008 and no changes required.  former smoker       Review of Systems  Constitutional: Negative for fever, chills, activity change, appetite change and fatigue.  HENT: Negative for hearing loss, ear pain, congestion, rhinorrhea, sneezing, mouth sores, trouble swallowing, neck pain, neck stiffness, dental problem, voice change, sinus pressure and tinnitus.   Eyes: Negative for photophobia, pain, redness and visual disturbance.  Respiratory: Negative for apnea, cough, choking, chest tightness, shortness of breath and wheezing.   Cardiovascular: Negative for chest pain, palpitations and leg swelling.  Gastrointestinal: Negative for nausea, vomiting, abdominal pain, diarrhea, constipation, blood in stool, abdominal distention, anal bleeding and rectal pain.  Genitourinary: Positive for frequency. Negative for dysuria, urgency, hematuria, flank pain, decreased urine volume, discharge, penile swelling, scrotal swelling, difficulty urinating, genital sores and testicular pain.  Musculoskeletal: Positive for back pain. Negative for myalgias, joint swelling, arthralgias and gait problem.  Skin: Negative for color change, rash and wound.  Neurological: Negative for dizziness, tremors, seizures, syncope, facial asymmetry, speech difficulty, weakness, light-headedness, numbness and headaches.  Hematological: Negative for adenopathy. Does not bruise/bleed easily.  Psychiatric/Behavioral: Negative for suicidal ideas, hallucinations, behavioral problems, confusion, sleep disturbance, self-injury, dysphoric mood, decreased concentration and agitation. The patient is not nervous/anxious.        Objective:   Physical Exam  Constitutional: He  appears well-developed and well-nourished.  Blood pressure 130/70  HENT:  Head: Normocephalic and atraumatic.  Right Ear: External ear normal.  Left Ear: External ear normal.  Nose: Nose normal.  Mouth/Throat: Oropharynx is clear and moist.  Edentulous  Eyes: Conjunctivae and EOM are normal. Pupils are equal, round, and reactive to light. No scleral icterus.  Neck: Normal range of motion. Neck supple. No JVD present. No thyromegaly present.  Cardiovascular: Normal rate, regular rhythm and normal heart sounds.  Exam reveals no gallop and no friction rub.   No murmur heard. Posterior tibia pulses full. Dorsalis pedis pulse is faint  Pulmonary/Chest: Effort normal and breath sounds normal. He exhibits no tenderness.  Abdominal: Soft. Bowel sounds are normal. He exhibits no distension and no mass. There is no tenderness.  Genitourinary: Prostate normal and penis normal.  Musculoskeletal: Normal range of motion. He exhibits no edema and no tenderness.  Lymphadenopathy:    He has no cervical adenopathy.  Neurological: He is alert. He has normal reflexes. No cranial nerve deficit. Coordination normal.  Decreased vibratory sensation distally. Monofilament testing intact  Skin: Skin is warm and dry. No rash noted.  Psychiatric: He has a normal mood and affect. His behavior is normal.          Assessment & Plan:   Preventive health examination. Declines colonoscopy. We'll give slides  to check for FOB  COPD. Patient is stable on low-dose prednisone. He has not done well on inhalational medications and he has had a difficult time affording. Although not ideal situation we'll continue. We'll challenge on a dose of 2.5 mg daily Diabetes mellitus type 2. Diet controlled.  We'll check a hemoglobin A1c. Lifestyle issues discussed. May need metformin therapy if hemoglobin A1c is greater than 6.5 Hypertension stable DJD and chronic low back pain. Medications refilled

## 2013-05-07 ENCOUNTER — Telehealth: Payer: Self-pay | Admitting: Internal Medicine

## 2013-05-07 MED ORDER — HYDROCODONE-ACETAMINOPHEN 7.5-325 MG PO TABS: 1.0000 | ORAL_TABLET | Freq: Four times a day (QID) | ORAL | Status: DC | PRN

## 2013-05-07 NOTE — Telephone Encounter (Signed)
Spoke to pt told him Rx ready for pickup will be at the front desk. Pt verbalized understanding. Rx printed and signed, put at front desk. 

## 2013-05-07 NOTE — Telephone Encounter (Signed)
Pt needs new rx hydrocodone °

## 2013-06-04 ENCOUNTER — Telehealth: Payer: Self-pay | Admitting: Internal Medicine

## 2013-06-04 NOTE — Telephone Encounter (Signed)
Pt need new rx hydrocodone. Pt would like rxs for next 3 months

## 2013-06-06 NOTE — Telephone Encounter (Signed)
Left message on voicemail to call office.  

## 2013-06-06 NOTE — Telephone Encounter (Signed)
Left detailed message that Dr.K is out of the office and will be back Monday will have Rx ready then.

## 2013-06-09 MED ORDER — HYDROCODONE-ACETAMINOPHEN 7.5-325 MG PO TABS: 1.0000 | ORAL_TABLET | Freq: Four times a day (QID) | ORAL | Status: DC | PRN

## 2013-06-09 NOTE — Telephone Encounter (Signed)
Rx printed and signed, put at front desk for pick up. 

## 2013-07-07 ENCOUNTER — Telehealth: Payer: Self-pay | Admitting: Internal Medicine

## 2013-07-07 NOTE — Telephone Encounter (Signed)
Pt is requesting a refill of his HYDROcodone-acetaminophen (NORCO) 7.5-325 MG per tablet. Please call when ready. Pt would also like a flu shot when he comes to pick up his rx.  Please transfer him back to scheduling if you speak with him when notifying him rx is ready.

## 2013-07-09 MED ORDER — HYDROCODONE-ACETAMINOPHEN 7.5-325 MG PO TABS: 1.0000 | ORAL_TABLET | Freq: Four times a day (QID) | ORAL | Status: DC | PRN

## 2013-07-09 NOTE — Telephone Encounter (Signed)
Spoke to pt told him Rx ready for pick up and he can make appt for Flu shot I can transfer him. Pt verbalized understanding and stated will call back for Flu shot. Told him okay. Rx printed and signed, put at front desk for pick up.

## 2013-08-05 ENCOUNTER — Telehealth: Payer: Self-pay | Admitting: Internal Medicine

## 2013-08-05 MED ORDER — HYDROCODONE-ACETAMINOPHEN 7.5-325 MG PO TABS: 1.0000 | ORAL_TABLET | Freq: Four times a day (QID) | ORAL | Status: DC | PRN

## 2013-08-05 NOTE — Telephone Encounter (Signed)
Pt needs new rx hydrocodone °

## 2013-08-05 NOTE — Telephone Encounter (Signed)
Rx printed and signed.  

## 2013-08-06 NOTE — Telephone Encounter (Signed)
Spoke to pt told him Rx will be ready Friday for pickup. Pt verbalized understanding.

## 2013-08-08 ENCOUNTER — Other Ambulatory Visit: Payer: Self-pay | Admitting: Internal Medicine

## 2013-08-21 ENCOUNTER — Encounter: Payer: Self-pay | Admitting: *Deleted

## 2013-08-22 ENCOUNTER — Ambulatory Visit (INDEPENDENT_AMBULATORY_CARE_PROVIDER_SITE_OTHER): Payer: Medicare Other | Admitting: Internal Medicine

## 2013-08-22 ENCOUNTER — Encounter: Payer: Self-pay | Admitting: Internal Medicine

## 2013-08-22 VITALS — BP 130/80 | HR 103 | Temp 99.3°F | Wt 202.0 lb

## 2013-08-22 DIAGNOSIS — I10 Essential (primary) hypertension: Secondary | ICD-10-CM

## 2013-08-22 DIAGNOSIS — J449 Chronic obstructive pulmonary disease, unspecified: Secondary | ICD-10-CM

## 2013-08-22 DIAGNOSIS — M1990 Unspecified osteoarthritis, unspecified site: Secondary | ICD-10-CM

## 2013-08-22 NOTE — Progress Notes (Signed)
Pre visit review using our clinic review tool, if applicable. No additional management support is needed unless otherwise documented below in the visit note. 

## 2013-08-22 NOTE — Patient Instructions (Signed)
It is important that you exercise regularly, at least 20 minutes 3 to 4 times per week.  If you develop chest pain or shortness of breath seek  medical attention.  Limit your sodium (Salt) intake  Please check your blood pressure on a regular basis.  If it is consistently greater than 150/90, please make an office appointment.  Return in 6 months for follow-up  

## 2013-08-22 NOTE — Progress Notes (Signed)
Subjective:    Patient ID: Christopher Dalton, male    DOB: 01-08-1941, 73 y.o.   MRN: [id number]  HPI  73 year old patient who is seen today for general followup.  He has history of COPD, allergic rhinitis that is well controlled.  He has only very rare use of his rescue albuterol.  His main problem is osteoarthritis.  Remains on day 3 or 4 time a day regimen of hydrocodone.  He has a history of treated hypertension, which has been stable.  No new concerns or complaints.  Past Medical History  Diagnosis Date  . IMPAIRED GLUCOSE TOLERANCE 03/02/2008  . DEPRESSION 11/08/2009  . HYPERTENSION 01/21/2007  . C A D 11/15/2009  . HYPOTENSION 04/13/2009  . COPD 01/21/2007  . INGUINAL HERNIA, RIGHT 03/25/2007  . OSTEOARTHRITIS 01/21/2007  . HIP PAIN, LEFT 10/29/2008  . LOW BACK PAIN 01/21/2007    History   Social History  . Marital Status: Married    Spouse Name: N/A    Number of Children: N/A  . Years of Education: N/A   Occupational History  . Not on file.   Social History Main Topics  . Smoking status: Former Games developermoker  . Smokeless tobacco: Not on file  . Alcohol Use:   . Drug Use:   . Sexual Activity:    Other Topics Concern  . Not on file   Social History Narrative  . No narrative on file    Past Surgical History  Procedure Laterality Date  . Tonsillectomy  unknown  . Reconstruction mid-face    . Inguinal hernia repair  2008    right    Family History  Problem Relation Age of Onset  . Cancer Mother     lung ca  . Cancer Father     unclear type    Allergies  Allergen Reactions  . Amoxicillin     REACTION: unspecified  . Barbiturates     REACTION: tongue swells, hands swell  . Pravastatin   . Sulfamethoxazole     REACTION: unspecified  . Statins Other (See Comments)    mucsle pain    Current Outpatient Prescriptions on File Prior to Visit  Medication Sig Dispense Refill  . albuterol (PROVENTIL HFA;VENTOLIN HFA) 108 (90 BASE) MCG/ACT inhaler Inhale 2 puffs into the  lungs every 6 (six) hours as needed for wheezing.  1 Inhaler  0  . aspirin 81 MG tablet Take 81 mg by mouth daily.        . benazepril-hydrochlorthiazide (LOTENSIN HCT) 20-12.5 MG per tablet TAKE 1 TABLET BY MOUTH DAILY.  90 tablet  1  . HYDROcodone-acetaminophen (NORCO) 7.5-325 MG per tablet Take 1 tablet by mouth every 6 (six) hours as needed.  120 tablet  0  . loratadine (CLARITIN) 10 MG tablet Take 10 mg by mouth daily.        . predniSONE (DELTASONE) 5 MG tablet TAKE 1 TABLET BY MOUTH DAILY.  90 tablet  1  . tamsulosin (FLOMAX) 0.4 MG CAPS capsule Take 1 capsule (0.4 mg total) by mouth daily.  30 capsule  3   No current facility-administered medications on file prior to visit.    BP 130/80  Pulse 103  Temp(Src) 99.3 F (37.4 C) (Oral)  Wt 202 lb (91.627 kg)  SpO2 97%       Review of Systems  Constitutional: Negative for fever, chills, appetite change and fatigue.  HENT: Positive for sinus pressure. Negative for congestion, dental problem, ear pain, hearing loss, sore  throat, tinnitus, trouble swallowing and voice change.   Eyes: Negative for pain, discharge and visual disturbance.  Respiratory: Negative for cough, chest tightness, wheezing and stridor.   Cardiovascular: Negative for chest pain, palpitations and leg swelling.  Gastrointestinal: Negative for nausea, vomiting, abdominal pain, diarrhea, constipation, blood in stool and abdominal distention.  Genitourinary: Negative for urgency, hematuria, flank pain, discharge, difficulty urinating and genital sores.  Musculoskeletal: Positive for arthralgias and back pain. Negative for gait problem, joint swelling, myalgias and neck stiffness.  Skin: Negative for rash.  Neurological: Negative for dizziness, syncope, speech difficulty, weakness, numbness and headaches.  Hematological: Negative for adenopathy. Does not bruise/bleed easily.  Psychiatric/Behavioral: Negative for behavioral problems and dysphoric mood. The patient is  not nervous/anxious.        Objective:   Physical Exam  Constitutional: He is oriented to person, place, and time. He appears well-developed.  HENT:  Head: Normocephalic.  Right Ear: External ear normal.  Left Ear: External ear normal.  Eyes: Conjunctivae and EOM are normal.  Neck: Normal range of motion.  Cardiovascular: Normal rate and normal heart sounds.   Pulmonary/Chest: Effort normal and breath sounds normal. No respiratory distress. He has no wheezes.  Abdominal: Bowel sounds are normal.  Musculoskeletal: Normal range of motion. He exhibits no edema and no tenderness.  Neurological: He is alert and oriented to person, place, and time.  Psychiatric: He has a normal mood and affect. His behavior is normal.          Assessment & Plan:   COPD stable Hypertension controlled Symptomatic osteoarthritis.  We'll check a urine drug screen  CPX 6 months

## 2013-08-25 ENCOUNTER — Telehealth: Payer: Self-pay | Admitting: Internal Medicine

## 2013-08-25 NOTE — Telephone Encounter (Signed)
Relevant patient education mailed to patient.  

## 2013-09-03 ENCOUNTER — Telehealth: Payer: Self-pay | Admitting: Internal Medicine

## 2013-09-03 MED ORDER — HYDROCODONE-ACETAMINOPHEN 7.5-325 MG PO TABS: 1.0000 | ORAL_TABLET | Freq: Four times a day (QID) | ORAL | Status: DC | PRN

## 2013-09-03 NOTE — Telephone Encounter (Signed)
Pt notified Rx's for him and his wife are ready for pickup, will be at the front desk. Rx printed and signed.

## 2013-09-03 NOTE — Telephone Encounter (Signed)
Pt request refill HYDROcodone-acetaminophen (NORCO) 7.5-325 MG per tablet °

## 2013-09-17 ENCOUNTER — Encounter: Payer: Self-pay | Admitting: Internal Medicine

## 2013-10-03 ENCOUNTER — Telehealth: Payer: Self-pay | Admitting: Internal Medicine

## 2013-10-03 MED ORDER — HYDROCODONE-ACETAMINOPHEN 7.5-325 MG PO TABS: 1.0000 | ORAL_TABLET | Freq: Four times a day (QID) | ORAL | Status: DC | PRN

## 2013-10-03 NOTE — Telephone Encounter (Signed)
Pt requesting refill of HYDROcodone-acetaminophen (NORCO) 7.5-325 MG per tablet. Call when ready for pick up.

## 2013-10-03 NOTE — Telephone Encounter (Signed)
Tried to contact pt no answer. Rx ready for pickup, put at the front desk. Rx printed and signed.

## 2013-10-03 NOTE — Telephone Encounter (Signed)
Pt notified Rx ready for pickup 

## 2013-11-03 ENCOUNTER — Telehealth: Payer: Self-pay | Admitting: Internal Medicine

## 2013-11-03 MED ORDER — HYDROCODONE-ACETAMINOPHEN 7.5-325 MG PO TABS: 1.0000 | ORAL_TABLET | Freq: Four times a day (QID) | ORAL | Status: DC | PRN

## 2013-11-03 NOTE — Telephone Encounter (Signed)
Left detailed message Rx ready for pickup, will be at the front desk. Rx printed and signed. 

## 2013-11-03 NOTE — Telephone Encounter (Signed)
req rx on HYDROcodone-acetaminophen (NORCO) 7.5-325 MG per tablet

## 2013-11-05 ENCOUNTER — Telehealth: Payer: Self-pay | Admitting: Internal Medicine

## 2013-11-05 NOTE — Telephone Encounter (Signed)
Pt call and said he need a referral for surgery of a hernia I ask pt if he had been in contact with a surgeon and he said no

## 2013-11-06 ENCOUNTER — Encounter: Payer: Self-pay | Admitting: Internal Medicine

## 2013-11-06 ENCOUNTER — Ambulatory Visit (INDEPENDENT_AMBULATORY_CARE_PROVIDER_SITE_OTHER): Payer: Medicare Other | Admitting: Internal Medicine

## 2013-11-06 VITALS — BP 130/74 | HR 95 | Temp 98.4°F | Resp 20 | Ht 75.5 in | Wt 202.0 lb

## 2013-11-06 DIAGNOSIS — J449 Chronic obstructive pulmonary disease, unspecified: Secondary | ICD-10-CM

## 2013-11-06 DIAGNOSIS — I10 Essential (primary) hypertension: Secondary | ICD-10-CM

## 2013-11-06 DIAGNOSIS — K436 Other and unspecified ventral hernia with obstruction, without gangrene: Secondary | ICD-10-CM

## 2013-11-06 NOTE — Progress Notes (Signed)
Pre-visit discussion using our clinic review tool. No additional management support is needed unless otherwise documented below in the visit note.  

## 2013-11-06 NOTE — Telephone Encounter (Signed)
Please advise referral?  

## 2013-11-06 NOTE — Patient Instructions (Signed)
Gen. surgery evaluation as discussed  Report to the emergency department for evaluation if significant pain or nausea or vomiting.

## 2013-11-06 NOTE — Progress Notes (Signed)
Subjective:    Patient ID: Christopher Dalton, male    DOB: [birth year], 73 y.o.   MRN: [id number]  HPI  73 year old patient who has a history of a chronic small midline ventral hernia.  For the past week.  He has had increasing pain.  Denies any constitutional complaints, nausea, or vomiting.  No change in his bowel habits  Past Medical History  Diagnosis Date  . IMPAIRED GLUCOSE TOLERANCE 03/02/2008  . DEPRESSION 11/08/2009  . HYPERTENSION 01/21/2007  . C A D 11/15/2009  . HYPOTENSION 04/13/2009  . COPD 01/21/2007  . INGUINAL HERNIA, RIGHT 03/25/2007  . OSTEOARTHRITIS 01/21/2007  . HIP PAIN, LEFT 10/29/2008  . LOW BACK PAIN 01/21/2007    History   Social History  . Marital Status: Married    Spouse Name: N/A    Number of Children: N/A  . Years of Education: N/A   Occupational History  . Not on file.   Social History Main Topics  . Smoking status: Former Games developermoker  . Smokeless tobacco: Not on file  . Alcohol Use:   . Drug Use:   . Sexual Activity:    Other Topics Concern  . Not on file   Social History Narrative  . No narrative on file    Past Surgical History  Procedure Laterality Date  . Tonsillectomy  unknown  . Reconstruction mid-face    . Inguinal hernia repair  2008    right    Family History  Problem Relation Age of Onset  . Cancer Mother     lung ca  . Cancer Father     unclear type    Allergies  Allergen Reactions  . Amoxicillin     REACTION: unspecified  . Barbiturates     REACTION: tongue swells, hands swell  . Pravastatin   . Sulfamethoxazole     REACTION: unspecified  . Statins Other (See Comments)    mucsle pain    Current Outpatient Prescriptions on File Prior to Visit  Medication Sig Dispense Refill  . albuterol (PROVENTIL HFA;VENTOLIN HFA) 108 (90 BASE) MCG/ACT inhaler Inhale 2 puffs into the lungs every 6 (six) hours as needed for wheezing.  1 Inhaler  0  . aspirin 81 MG tablet Take 81 mg by mouth daily.        .  benazepril-hydrochlorthiazide (LOTENSIN HCT) 20-12.5 MG per tablet TAKE 1 TABLET BY MOUTH DAILY.  90 tablet  1  . HYDROcodone-acetaminophen (NORCO) 7.5-325 MG per tablet Take 1 tablet by mouth every 6 (six) hours as needed.  120 tablet  0  . loratadine (CLARITIN) 10 MG tablet Take 10 mg by mouth daily.        . predniSONE (DELTASONE) 5 MG tablet TAKE 1 TABLET BY MOUTH DAILY.  90 tablet  1   No current facility-administered medications on file prior to visit.    BP 130/74  Pulse 95  Temp(Src) 98.4 F (36.9 C) (Oral)  Resp 20  Ht 6' 3.5" (1.918 m)  Wt 202 lb (91.627 kg)  BMI 24.91 kg/m2  SpO2 94%       Review of Systems  Constitutional: Negative for fever, chills, appetite change and fatigue.  HENT: Negative for congestion, dental problem, ear pain, hearing loss, sore throat, tinnitus, trouble swallowing and voice change.   Eyes: Negative for pain, discharge and visual disturbance.  Respiratory: Negative for cough, chest tightness, wheezing and stridor.   Cardiovascular: Negative for chest pain, palpitations and leg swelling.  Gastrointestinal: Positive for abdominal  pain. Negative for nausea, vomiting, diarrhea, constipation, blood in stool and abdominal distention.  Genitourinary: Negative for urgency, hematuria, flank pain, discharge, difficulty urinating and genital sores.  Musculoskeletal: Negative for arthralgias, back pain, gait problem, joint swelling, myalgias and neck stiffness.  Skin: Negative for rash.  Neurological: Negative for dizziness, syncope, speech difficulty, weakness, numbness and headaches.  Hematological: Negative for adenopathy. Does not bruise/bleed easily.  Psychiatric/Behavioral: Negative for behavioral problems and dysphoric mood. The patient is not nervous/anxious.        Objective:   Physical Exam  Constitutional: He appears well-developed and well-nourished. No distress.  Abdominal: Soft. Bowel sounds are normal. He exhibits mass. There is  tenderness.  Small approximately 3 cm  midline ventral hernia just superior to the umbilicus.  Not completely reducible easily in the supine position          Assessment & Plan:   Symptomatic midline ventral hernia.  We'll set up for urgent general surgery evaluation

## 2013-11-07 ENCOUNTER — Telehealth: Payer: Self-pay | Admitting: *Deleted

## 2013-11-07 NOTE — Telephone Encounter (Signed)
Dr Lesia HausenKwiatowski placed an referral for a Irreducible ventral hernia. for patient. I [facility], [phone] per Arline Aspindy in Triage she needs to get more details on this pt...thanks need a call from Dr. Amador CunasKwiatkowski or his nurse to provide more details bout this referral so they can go ahead and schedule this patient.

## 2013-11-07 NOTE — Telephone Encounter (Signed)
Dr Lesia HausenKwiatowski placed an referral for a Irreducible ventral hernia. for patient. I sent [facility] to [facility], [phone] per Arline Aspindy in Triage she needs to get more details on this pt...thanks need a call from Dr. Amador CunasKwiatkowski or his nurse to provide more details bout this referral so they can go ahead and schedule this patient

## 2013-11-07 NOTE — Telephone Encounter (Signed)
Called and left message for Liborio NixonJanice to call me regarding pt.

## 2013-11-10 ENCOUNTER — Telehealth (INDEPENDENT_AMBULATORY_CARE_PROVIDER_SITE_OTHER): Payer: Self-pay

## 2013-11-10 NOTE — Telephone Encounter (Signed)
Lupita Leashonna at Dr Vernon PreyKwiatkowski's office called re: setting up appt for pt with partial incarcerated ventral hernia. I advised if hernia is incarcerated and cannot be reduced the pt should be sent to ER asap. Per Lupita LeashDonna she states Dr Kirtland BouchardK does not think this is an emergency since it partially reduces. Dr Amador CunasKwiatkowski request pt to be seen soon as office pt. Appt given for 11-14-13. Lupita LeashDonna advised if her MD has any concern about the  incarceration call back. She states she will.

## 2013-11-10 NOTE — Telephone Encounter (Signed)
Called [facility] and spoke to Beatriceina, and gave her information on patient. Pt has ventral hernia which is irreducible and Dr.K thinks bowel may be involved. Inetta Fermoina verbalized understanding and will pass along to Triage to get pt scheduled soon.

## 2013-11-10 NOTE — Telephone Encounter (Signed)
Arline AspCindy called back from [facility], I explained what was going on with pt. Arline AspCindy said will schedule pt but if think incarcerated need to go to ED. Told her Dr. Kirtland BouchardK is not sure. Cindy scheduled appointment for Friday 5/8 with Dr. Magnus IvanBlackman at 10:00 AM. Told her okay, thanks I will let pt know.

## 2013-11-10 NOTE — Telephone Encounter (Signed)
Discussed with Dr. Kirtland BouchardK, asked me to check on pt if doing okay appointment is fine.   Called and spoke to pt , asked him how he was? Pt said he is doing a little better, pain is better. Told him okay, I have an appointment for you to [facility] Friday [facility] at 10:00 AM. Address is [address] Pt verbalized understanding. Told pt any increase in pain, nausea or vomiting to call the office. Pt verbalized understanding.

## 2013-11-14 ENCOUNTER — Encounter (INDEPENDENT_AMBULATORY_CARE_PROVIDER_SITE_OTHER): Payer: Self-pay | Admitting: Surgery

## 2013-11-14 ENCOUNTER — Telehealth (INDEPENDENT_AMBULATORY_CARE_PROVIDER_SITE_OTHER): Payer: Self-pay | Admitting: Surgery

## 2013-11-14 ENCOUNTER — Ambulatory Visit (INDEPENDENT_AMBULATORY_CARE_PROVIDER_SITE_OTHER): Payer: Medicare Other | Admitting: Surgery

## 2013-11-14 VITALS — BP 144/84 | HR 73 | Temp 97.5°F | Ht 75.0 in | Wt 199.0 lb

## 2013-11-14 DIAGNOSIS — K420 Umbilical hernia with obstruction, without gangrene: Secondary | ICD-10-CM

## 2013-11-14 NOTE — Progress Notes (Signed)
Patient ID: Christopher Clocklton E Hannum, male   DOB: 1941/06/30, 73 y.o.   MRN: [id number]  Chief Complaint  Patient presents with  . eval hernia    HPI Christopher Dalton is a 73 y.o. male.   HPI He is referred by Dr. Julious PayerKwatowski for evaluation of a supraumbilical hernia. The patient reports having it for at least 6 months. He is been having intermittent pain at the site of the hernia. This has been moderate at times. He has no obstructive symptoms. He denies any nausea or vomiting. He is otherwise currently without complaints Past Medical History  Diagnosis Date  . IMPAIRED GLUCOSE TOLERANCE 03/02/2008  . DEPRESSION 11/08/2009  . HYPERTENSION 01/21/2007  . C A D 11/15/2009  . HYPOTENSION 04/13/2009  . COPD 01/21/2007  . INGUINAL HERNIA, RIGHT 03/25/2007  . OSTEOARTHRITIS 01/21/2007  . HIP PAIN, LEFT 10/29/2008  . LOW BACK PAIN 01/21/2007    Past Surgical History  Procedure Laterality Date  . Tonsillectomy  unknown  . Reconstruction mid-face    . Inguinal hernia repair  2008    right    Family History  Problem Relation Age of Onset  . Cancer Mother     lung ca  . Cancer Father     unclear type    Social History History  Substance Use Topics  . Smoking status: Former Games developermoker  . Smokeless tobacco: Not on file  . Alcohol Use: No    Allergies  Allergen Reactions  . Amoxicillin     REACTION: unspecified  . Barbiturates     REACTION: tongue swells, hands swell  . Pravastatin   . Sulfamethoxazole     REACTION: unspecified  . Statins Other (See Comments)    mucsle pain    Current Outpatient Prescriptions  Medication Sig Dispense Refill  . albuterol (PROVENTIL HFA;VENTOLIN HFA) 108 (90 BASE) MCG/ACT inhaler Inhale 2 puffs into the lungs every 6 (six) hours as needed for wheezing.  1 Inhaler  0  . aspirin 81 MG tablet Take 81 mg by mouth daily.        . benazepril-hydrochlorthiazide (LOTENSIN HCT) 20-12.5 MG per tablet TAKE 1 TABLET BY MOUTH DAILY.  90 tablet  1  . HYDROcodone-acetaminophen  (NORCO) 7.5-325 MG per tablet Take 1 tablet by mouth every 6 (six) hours as needed.  120 tablet  0  . loratadine (CLARITIN) 10 MG tablet Take 10 mg by mouth daily.        . predniSONE (DELTASONE) 5 MG tablet TAKE 1 TABLET BY MOUTH DAILY.  90 tablet  1   No current facility-administered medications for this visit.    Review of Systems Review of Systems  Constitutional: Negative for fever, chills and unexpected weight change.  HENT: Negative for congestion, hearing loss, sore throat, trouble swallowing and voice change.   Eyes: Negative for visual disturbance.  Respiratory: Positive for shortness of breath and wheezing. Negative for cough.   Cardiovascular: Negative for chest pain, palpitations and leg swelling.  Gastrointestinal: Negative for nausea, vomiting, abdominal pain, diarrhea, constipation, blood in stool, abdominal distention, anal bleeding and rectal pain.  Genitourinary: Negative for hematuria and difficulty urinating.  Musculoskeletal: Negative for arthralgias.  Skin: Negative for rash and wound.  Neurological: Negative for seizures, syncope, weakness and headaches.  Hematological: Negative for adenopathy. Does not bruise/bleed easily.  Psychiatric/Behavioral: Negative for confusion.    Blood pressure 144/84, pulse 73, temperature 97.5 F (36.4 C), height 6\' 3"  (1.905 m), weight 199 lb (90.266 kg).  Physical Exam  Physical Exam  Constitutional: He is oriented to person, place, and time. He appears well-developed and well-nourished. No distress.  HENT:  Head: Normocephalic and atraumatic.  Right Ear: External ear normal.  Left Ear: External ear normal.  Nose: Nose normal.  Mouth/Throat: Oropharynx is clear and moist. No oropharyngeal exudate.  Eyes: Conjunctivae are normal. Pupils are equal, round, and reactive to light. Right eye exhibits no discharge. Left eye exhibits no discharge. No scleral icterus.  Neck: Normal range of motion. Neck supple. No tracheal deviation  present.  Cardiovascular: Normal rate, regular rhythm, normal heart sounds and intact distal pulses.   No murmur heard. Pulmonary/Chest: Effort normal and breath sounds normal. No respiratory distress. He has no wheezes. He has no rales.  Abdominal: Soft. Bowel sounds are normal. There is no tenderness. There is no guarding.  There is a small, chronically incarcerated hernia above the umbilicus. It is nontender today. I suspect it contains preperitoneal fat or omentum  Musculoskeletal: Normal range of motion. He exhibits no edema and no tenderness.  Lymphadenopathy:    He has no cervical adenopathy.  Neurological: He is alert and oriented to person, place, and time.  Skin: Skin is warm and dry. No rash noted. He is not diaphoretic. No erythema.  Psychiatric: His behavior is normal. Judgment normal.    Data Reviewed   Assessment    Incarcerated supraumbilical hernia     Plan    Again, I suspect this contained either pre-peritoneal fat or omentum as he is nontender and has no pain today. I recommend repair of the hernia with possible mesh. He is going to go home and think about it and then call me back if he decides surgery. I explained to him the risks of not repairing the surgery in detail        Shelly RubensteinDouglas A Keenya Matera 11/14/2013, 10:19 AM

## 2013-11-14 NOTE — Telephone Encounter (Signed)
Will proceed with surgery

## 2013-12-03 ENCOUNTER — Telehealth: Payer: Self-pay | Admitting: Internal Medicine

## 2013-12-03 MED ORDER — HYDROCODONE-ACETAMINOPHEN 7.5-325 MG PO TABS: 1.0000 | ORAL_TABLET | Freq: Four times a day (QID) | ORAL | Status: DC | PRN

## 2013-12-03 NOTE — Telephone Encounter (Signed)
Pt needs new rx hydrocodone °

## 2013-12-03 NOTE — Telephone Encounter (Signed)
Pt notified Rx ready for pickup. Rx printed and signed.  

## 2014-01-06 ENCOUNTER — Telehealth: Payer: Self-pay | Admitting: Internal Medicine

## 2014-01-06 ENCOUNTER — Telehealth: Payer: Self-pay | Admitting: *Deleted

## 2014-01-06 MED ORDER — HYDROCODONE-ACETAMINOPHEN 7.5-325 MG PO TABS: 1.0000 | ORAL_TABLET | Freq: Four times a day (QID) | ORAL | Status: DC | PRN

## 2014-01-06 NOTE — Telephone Encounter (Signed)
Received phone call from [facility] pharmacist Vinay, he said pt is there to have Hydrocodone Rx filled, but he had a Rx for Percocet prescribed by OrthopedicDr. Darrelyn HillockGioffre filled on 6/26, # 40 along with prednisone. He said pt has enough for another 7 days should he fill Hydrocodone. Told Vinay no, please do not fill till pt is out of Percocet. Vinay verbalized understanding.

## 2014-01-06 NOTE — Telephone Encounter (Signed)
Pt notified Rx ready for pickup. Rx printed and signed.  

## 2014-01-06 NOTE — Telephone Encounter (Signed)
Pt request rx HYDROcodone-acetaminophen (NORCO) 7.5-325 MG per tablet

## 2014-01-08 ENCOUNTER — Other Ambulatory Visit: Payer: Self-pay | Admitting: Orthopedic Surgery

## 2014-01-08 DIAGNOSIS — M48061 Spinal stenosis, lumbar region without neurogenic claudication: Secondary | ICD-10-CM

## 2014-01-08 DIAGNOSIS — M5126 Other intervertebral disc displacement, lumbar region: Secondary | ICD-10-CM

## 2014-01-12 ENCOUNTER — Ambulatory Visit
Admission: RE | Admit: 2014-01-12 | Discharge: 2014-01-12 | Disposition: A | Discharge status: Home / Self Care | Payer: Medicare Other | Source: Ambulatory Visit | Attending: Orthopedic Surgery | Admitting: Orthopedic Surgery

## 2014-01-12 VITALS — BP 131/63 | HR 100

## 2014-01-12 DIAGNOSIS — M48061 Spinal stenosis, lumbar region without neurogenic claudication: Secondary | ICD-10-CM

## 2014-01-12 DIAGNOSIS — M5126 Other intervertebral disc displacement, lumbar region: Secondary | ICD-10-CM

## 2014-01-12 MED ORDER — ONDANSETRON HCL 4 MG/2ML IJ SOLN
4.0000 mg | Freq: Once | INTRAMUSCULAR | Status: AC
  Administered 2014-01-12: 4 mg via INTRAMUSCULAR

## 2014-01-12 MED ORDER — HYDROMORPHONE HCL PF 2 MG/ML IJ SOLN
2.0000 mg | Freq: Once | INTRAMUSCULAR | Status: AC
  Administered 2014-01-12: 1 mg via INTRAMUSCULAR

## 2014-01-12 MED ORDER — IOHEXOL 180 MG/ML  SOLN
15.0000 mL | Freq: Once | INTRAMUSCULAR | Status: AC | PRN
  Administered 2014-01-12: 15 mL via INTRATHECAL

## 2014-01-12 MED ORDER — DIAZEPAM 5 MG PO TABS
5.0000 mg | ORAL_TABLET | Freq: Once | ORAL | Status: AC
  Administered 2014-01-12: 5 mg via ORAL

## 2014-01-12 MED ORDER — DIAZEPAM 5 MG PO TABS: 10.0000 mg | ORAL_TABLET | Freq: Once | ORAL | Status: DC

## 2014-01-12 MED ORDER — MEPERIDINE HCL 100 MG/ML IJ SOLN
75.0000 mg | Freq: Once | INTRAMUSCULAR | Status: AC
  Administered 2014-01-12: 75 mg via INTRAMUSCULAR

## 2014-01-12 NOTE — Discharge Instructions (Signed)

## 2014-01-20 ENCOUNTER — Encounter: Payer: Self-pay | Admitting: Internal Medicine

## 2014-01-20 ENCOUNTER — Ambulatory Visit (INDEPENDENT_AMBULATORY_CARE_PROVIDER_SITE_OTHER): Payer: Medicare Other | Admitting: Internal Medicine

## 2014-01-20 VITALS — BP 120/70 | HR 107 | Temp 98.0°F | Resp 20 | Ht 75.0 in | Wt 193.0 lb

## 2014-01-20 DIAGNOSIS — M545 Low back pain, unspecified: Secondary | ICD-10-CM

## 2014-01-20 DIAGNOSIS — I10 Essential (primary) hypertension: Secondary | ICD-10-CM

## 2014-01-20 DIAGNOSIS — J449 Chronic obstructive pulmonary disease, unspecified: Secondary | ICD-10-CM

## 2014-01-20 DIAGNOSIS — I2510 Atherosclerotic heart disease of native coronary artery without angina pectoris: Secondary | ICD-10-CM

## 2014-01-20 DIAGNOSIS — M1990 Unspecified osteoarthritis, unspecified site: Secondary | ICD-10-CM

## 2014-01-20 NOTE — Progress Notes (Signed)
Pre visit review using our clinic review tool, if applicable. No additional management support is needed unless otherwise documented below in the visit note. 

## 2014-01-20 NOTE — Progress Notes (Signed)
Christopher Dalton, [place]  -  Please enter preop orders in Epic for Christopher Dalton - [facility] / labs.  Thanks.

## 2014-01-20 NOTE — Patient Instructions (Signed)
Limit your sodium (Salt) intake  Return in 6 months for follow-up  

## 2014-01-20 NOTE — Progress Notes (Signed)
Subjective:    Patient ID: Christopher Dalton, male    DOB: 1941-01-03, 73 y.o.   MRN: [id number]  HPI  73 year old patient who has a history of hypertension, impaired glucose tolerance, and COPD.  He has a prior history of tobacco use.  He is seen today for surgical clearance for a lumbar decompression at L3-4 with microdiscectomy. His cardiopulmonary status has been stable although he has been very inactive over the past several months.  Due to low back pain.  Denies any shortness of breath, or any history of exertional chest pain.  Denies any palpitations. He underwent a nuclear stress test in 2011.  This was consistent with prior inferior wall and inferoseptal infarction, but there was no inducible ischemia.  Wall motion normal     Past Medical History  Diagnosis Date  . IMPAIRED GLUCOSE TOLERANCE 03/02/2008  . DEPRESSION 11/08/2009  . HYPERTENSION 01/21/2007  . C A D 11/15/2009  . HYPOTENSION 04/13/2009  . COPD 01/21/2007  . INGUINAL HERNIA, RIGHT 03/25/2007  . OSTEOARTHRITIS 01/21/2007  . HIP PAIN, LEFT 10/29/2008  . LOW BACK PAIN 01/21/2007    History   Social History  . Marital Status: Married    Spouse Name: N/A    Number of Children: N/A  . Years of Education: N/A   Occupational History  . Not on file.   Social History Main Topics  . Smoking status: Former Games developer  . Smokeless tobacco: Not on file  . Alcohol Use: No  . Drug Use: No  . Sexual Activity: Not on file   Other Topics Concern  . Not on file   Social History Narrative  . No narrative on file    Past Surgical History  Procedure Laterality Date  . Tonsillectomy  unknown  . Reconstruction mid-face    . Inguinal hernia repair  2008    right    Family History  Problem Relation Age of Onset  . Cancer Mother     lung ca  . Cancer Father     unclear type    Allergies  Allergen Reactions  . Amoxicillin     REACTION: unspecified  . Barbiturates     REACTION: tongue swells, hands swell  . Pravastatin    . Sulfamethoxazole     REACTION: unspecified  . Statins Other (See Comments)    mucsle pain    Current Outpatient Prescriptions on File Prior to Visit  Medication Sig Dispense Refill  . albuterol (PROVENTIL HFA;VENTOLIN HFA) 108 (90 BASE) MCG/ACT inhaler Inhale 2 puffs into the lungs every 6 (six) hours as needed for wheezing.  1 Inhaler  0  . aspirin 81 MG tablet Take 81 mg by mouth daily.        . benazepril-hydrochlorthiazide (LOTENSIN HCT) 20-12.5 MG per tablet TAKE 1 TABLET BY MOUTH DAILY.  90 tablet  1  . HYDROcodone-acetaminophen (NORCO) 7.5-325 MG per tablet Take 1 tablet by mouth every 6 (six) hours as needed.  120 tablet  0  . loratadine (CLARITIN) 10 MG tablet Take 10 mg by mouth daily.        . predniSONE (DELTASONE) 5 MG tablet TAKE 1 TABLET BY MOUTH DAILY.  90 tablet  1   No current facility-administered medications on file prior to visit.    BP 120/70  Pulse 107  Temp(Src) 98 F (36.7 C) (Oral)  Resp 20  Ht 6\' 3"  (1.905 m)  Wt 193 lb (87.544 kg)  BMI 24.12 kg/m2  SpO2 98%  Review of Systems  Constitutional: Negative for fever, chills, appetite change and fatigue.  HENT: Negative for congestion, dental problem, ear pain, hearing loss, sore throat, tinnitus, trouble swallowing and voice change.   Eyes: Negative for pain, discharge and visual disturbance.  Respiratory: Negative for cough, chest tightness, wheezing and stridor.   Cardiovascular: Negative for chest pain, palpitations and leg swelling.  Gastrointestinal: Negative for nausea, vomiting, abdominal pain, diarrhea, constipation, blood in stool and abdominal distention.  Genitourinary: Negative for urgency, hematuria, flank pain, discharge, difficulty urinating and genital sores.  Musculoskeletal: Negative for arthralgias, back pain, gait problem, joint swelling, myalgias and neck stiffness.       Low back and left hip pain  Skin: Negative for rash.  Neurological: Negative for dizziness,  syncope, speech difficulty, weakness, numbness and headaches.  Hematological: Negative for adenopathy. Does not bruise/bleed easily.  Psychiatric/Behavioral: Negative for behavioral problems and dysphoric mood. The patient is not nervous/anxious.        Objective:   Physical Exam  Constitutional: He is oriented to person, place, and time. He appears well-developed.  Difficulty transferring due  to the pain  HENT:  Head: Normocephalic.  Right Ear: External ear normal.  Left Ear: External ear normal.  Eyes: Conjunctivae and EOM are normal.  Neck: Normal range of motion.  Cardiovascular: Normal rate and regular rhythm.   Faint heart sounds  Pulmonary/Chest: Breath sounds normal.  Abdominal: Bowel sounds are normal.  Musculoskeletal: Normal range of motion. He exhibits no edema and no tenderness.  Neurological: He is alert and oriented to person, place, and time.  Psychiatric: He has a normal mood and affect. His behavior is normal.          Assessment & Plan:   Low back pain secondary to lumbar disc disease and advanced osteoarthritis History of coronary artery disease.  Clinically stable COPD  No contraindications to surgery CPX 6 months No change in medical regimen

## 2014-01-21 ENCOUNTER — Encounter (HOSPITAL_COMMUNITY): Payer: Self-pay | Admitting: Pharmacy Technician

## 2014-01-21 ENCOUNTER — Other Ambulatory Visit (HOSPITAL_COMMUNITY): Payer: Self-pay | Admitting: Orthopedic Surgery

## 2014-01-21 ENCOUNTER — Other Ambulatory Visit: Payer: Self-pay | Admitting: Surgical

## 2014-01-21 NOTE — Patient Instructions (Addendum)
[facility]: [facility]28[facility]14  [facility]   [facility]- FOLLOW SIGNS TO SHORT STAY CENTER Short Stay Center at    0730   AM.   Call this number if you have problems the morning of surgery: [phone]        Do not eat food  Or drink :After Midnight. Tuesday NIGHT   Take these medicines the morning of surgery with A SIP OF WATER: LORATADINE, PREDNISONE ,  May take hydrocodone if needed   Contacts, dentures or partial plates, or metal hairpins  can not be worn to surgery. Your family will be responsible for glasses, dentures, hearing aides while you are in surgery  Leave suitcase in the car. After surgery it may be brought to your room.  For patients admitted to the hospital, checkout time is 11:00 AM day of  discharge.         [facility] IS NOT RESPONSIBLE FOR ANY VALUABLES                                                                                                                             [facility] - Preparing for Surgery Before surgery, you can play an important role.  Because skin is not sterile, your skin needs to be as free of germs as possible.  You can reduce the number of germs on your skin by washing with CHG (chlorahexidine gluconate) soap before surgery.  CHG is an antiseptic cleaner which kills germs and bonds with the skin to continue killing germs even after washing. Please DO NOT use if you have an allergy to CHG or antibacterial soaps.  If your skin becomes reddened/irritated stop using the CHG and inform your nurse when you arrive at Short Stay. Do not shave (including legs and underarms) for at least 48 hours prior to the first CHG shower.  You may shave your face/neck. Please follow these instructions carefully:  1.  Shower with CHG Soap the night before surgery and the  morning of Surgery.  2.  If you choose to wash your hair, wash your hair first as usual with your  normal  shampoo.  3.  After you shampoo,  rinse your hair and body thoroughly to remove the  shampoo.                           4.  Use CHG as you would any other liquid soap.  You can apply chg directly  to the skin and wash                       Gently with a scrungie or clean washcloth.  5.  Apply the CHG Soap to your body ONLY FROM THE NECK DOWN.   Do not use on face/ open  Wound or open sores. Avoid contact with eyes, ears mouth and genitals (private parts).                       Wash face,  Genitals (private parts) with your normal soap.             6.  Wash thoroughly, paying special attention to the area where your surgery  will be performed.  7.  Thoroughly rinse your body with warm water from the neck down.  8.  DO NOT shower/wash with your normal soap after using and rinsing off  the CHG Soap.                9.  Pat yourself dry with a clean towel.            10.  Wear clean pajamas.            11.  Place clean sheets on your bed the night of your first shower and do not  sleep with pets. Day of Surgery : Do not apply any lotions/deodorants the morning of surgery.  Please wear clean clothes to the hospital/surgery center.  FAILURE TO FOLLOW THESE INSTRUCTIONS MAY RESULT IN THE CANCELLATION OF YOUR SURGERY PATIENT SIGNATURE_________________________________  NURSE SIGNATURE__________________________________  ________________________________________________________________________   Christopher Dalton  An incentive spirometer is a tool that can help keep your lungs clear and active. This tool measures how well you are filling your lungs with each breath. Taking long deep breaths may help reverse or decrease the chance of developing breathing (pulmonary) problems (especially infection) following:  A long period of time when you are unable to move or be active. BEFORE THE PROCEDURE   If the spirometer includes an indicator to show your best effort, your nurse or respiratory therapist will set it  to a desired goal.  If possible, sit up straight or lean slightly forward. Try not to slouch.  Hold the incentive spirometer in an upright position. INSTRUCTIONS FOR USE  1. Sit on the edge of your bed if possible, or sit up as far as you can in bed or on a chair. 2. Hold the incentive spirometer in an upright position. 3. Breathe out normally. 4. Place the mouthpiece in your mouth and seal your lips tightly around it. 5. Breathe in slowly and as deeply as possible, raising the piston or the ball toward the top of the column. 6. Hold your breath for 3-5 seconds or for as long as possible. Allow the piston or ball to fall to the bottom of the column. 7. Remove the mouthpiece from your mouth and breathe out normally. 8. Rest for a few seconds and repeat Steps 1 through 7 at least 10 times every 1-2 hours when you are awake. Take your time and take a few normal breaths between deep breaths. 9. The spirometer may include an indicator to show your best effort. Use the indicator as a goal to work toward during each repetition. 10. After each set of 10 deep breaths, practice coughing to be sure your lungs are clear. If you have an incision (the cut made at the time of surgery), support your incision when coughing by placing a pillow or rolled up towels firmly against it. Once you are able to get out of bed, walk around indoors and cough well. You may stop using the incentive spirometer when instructed by your caregiver.  RISKS AND COMPLICATIONS  Take your time so you do not get  dizzy or light-headed.  If you are in pain, you may need to take or ask for pain medication before doing incentive spirometry. It is harder to take a deep breath if you are having pain. AFTER USE  Rest and breathe slowly and easily.  It can be helpful to keep track of a log of your progress. Your caregiver can provide you with a simple table to help with this. If you are using the spirometer at home, follow these  instructions: [place] IF:   You are having difficultly using the spirometer.  You have trouble using the spirometer as often as instructed.  Your pain medication is not giving enough relief while using the spirometer.  You develop fever of 100.5 F (38.1 C) or higher. SEEK IMMEDIATE MEDICAL CARE IF:   You cough up bloody sputum that had not been present before.  You develop fever of 102 F (38.9 C) or greater.  You develop worsening pain at or near the incision site. MAKE SURE YOU:   Understand these instructions.  Will watch your condition.  Will get help right away if you are not doing well or get worse. Document Released: 11/06/2006 Document Revised: 09/18/2011 Document Reviewed: 01/07/2007 [facility] Patient Information 2014 [place], [place].   ________________________________________________________________________

## 2014-01-21 NOTE — Progress Notes (Signed)
Clearance Dr Amador CunasKwiatkowski chart

## 2014-01-22 ENCOUNTER — Encounter (HOSPITAL_COMMUNITY): Payer: Self-pay

## 2014-01-22 ENCOUNTER — Ambulatory Visit (HOSPITAL_COMMUNITY)
Admission: RE | Admit: 2014-01-22 | Discharge: 2014-01-22 | Disposition: A | Discharge status: Home / Self Care | Payer: Medicare Other | Source: Ambulatory Visit | Attending: Surgical | Admitting: Surgical

## 2014-01-22 ENCOUNTER — Encounter (HOSPITAL_COMMUNITY)
Admission: RE | Admit: 2014-01-22 | Discharge: 2014-01-22 | Disposition: A | Discharge status: Home / Self Care | Payer: Medicare Other | Source: Ambulatory Visit | Attending: Orthopedic Surgery | Admitting: Orthopedic Surgery

## 2014-01-22 DIAGNOSIS — M5137 Other intervertebral disc degeneration, lumbosacral region: Secondary | ICD-10-CM | POA: Diagnosis not present

## 2014-01-22 DIAGNOSIS — J984 Other disorders of lung: Secondary | ICD-10-CM | POA: Diagnosis not present

## 2014-01-22 DIAGNOSIS — I10 Essential (primary) hypertension: Secondary | ICD-10-CM | POA: Diagnosis not present

## 2014-01-22 DIAGNOSIS — Z01810 Encounter for preprocedural cardiovascular examination: Secondary | ICD-10-CM | POA: Diagnosis not present

## 2014-01-22 DIAGNOSIS — J438 Other emphysema: Secondary | ICD-10-CM | POA: Diagnosis not present

## 2014-01-22 DIAGNOSIS — Z01818 Encounter for other preprocedural examination: Secondary | ICD-10-CM | POA: Diagnosis present

## 2014-01-22 DIAGNOSIS — Z01812 Encounter for preprocedural laboratory examination: Secondary | ICD-10-CM | POA: Insufficient documentation

## 2014-01-22 DIAGNOSIS — M51379 Other intervertebral disc degeneration, lumbosacral region without mention of lumbar back pain or lower extremity pain: Secondary | ICD-10-CM | POA: Insufficient documentation

## 2014-01-22 HISTORY — DX: Gastro-esophageal reflux disease without esophagitis: K21.9

## 2014-01-22 LAB — CBC
HEMATOCRIT: 44.9 % (ref 39.0–52.0)
HEMOGLOBIN: 15.3 g/dL (ref 13.0–17.0)
MCH: 30 pg (ref 26.0–34.0)
MCHC: 34.1 g/dL (ref 30.0–36.0)
MCV: 88 fL (ref 78.0–100.0)
Platelets: 287 10*3/uL (ref 150–400)
RBC: 5.1 MIL/uL (ref 4.22–5.81)
RDW: 13 % (ref 11.5–15.5)
WBC: 14.4 10*3/uL — AB (ref 4.0–10.5)

## 2014-01-22 LAB — SURGICAL PCR SCREEN
MRSA, PCR: NEGATIVE
Staphylococcus aureus: POSITIVE — AB

## 2014-01-22 LAB — COMPREHENSIVE METABOLIC PANEL
ALT: 24 U/L (ref 0–53)
AST: 24 U/L (ref 0–37)
Albumin: 4.4 g/dL (ref 3.5–5.2)
Alkaline Phosphatase: 66 U/L (ref 39–117)
Anion gap: 13 (ref 5–15)
BUN: 12 mg/dL (ref 6–23)
CO2: 30 mEq/L (ref 19–32)
Calcium: 9.9 mg/dL (ref 8.4–10.5)
Chloride: 96 mEq/L (ref 96–112)
Creatinine, Ser: 0.78 mg/dL (ref 0.50–1.35)
GFR calc Af Amer: >90 mL/min (ref >90)
GFR calc non Af Amer: 86 mL/min — ABNORMAL LOW (ref >90)
Glucose, Bld: 152 mg/dL — ABNORMAL HIGH (ref 70–99)
Potassium: 3.7 mEq/L (ref 3.7–5.3)
Sodium: 139 mEq/L (ref 137–147)
Total Bilirubin: 0.4 mg/dL (ref 0.3–1.2)
Total Protein: 7.8 g/dL (ref 6.0–8.3)

## 2014-01-22 LAB — URINALYSIS, ROUTINE W REFLEX MICROSCOPIC
Bilirubin Urine: NEGATIVE
Glucose, UA: NEGATIVE mg/dL
Hgb urine dipstick: NEGATIVE
Ketones, ur: NEGATIVE mg/dL
Leukocytes, UA: NEGATIVE
Nitrite: NEGATIVE
Protein, ur: NEGATIVE mg/dL
Specific Gravity, Urine: 1.022 (ref 1.005–1.030)
Urobilinogen, UA: 0.2 mg/dL (ref 0.0–1.0)
pH: 6 (ref 5.0–8.0)

## 2014-01-22 LAB — APTT: aPTT: 29 seconds (ref 24–37)

## 2014-01-22 LAB — PROTIME-INR
INR: 1 (ref 0.00–1.49)
Prothrombin Time: 13.2 seconds (ref 11.6–15.2)

## 2014-01-22 NOTE — Progress Notes (Signed)
Faxed cbc to dr Darrelyn Hillockgioffre via epic

## 2014-01-28 ENCOUNTER — Encounter (HOSPITAL_COMMUNITY)
Admission: RE | Disposition: A | Discharge status: Home / Self Care | Payer: Self-pay | Source: Ambulatory Visit | Attending: Orthopedic Surgery

## 2014-01-28 ENCOUNTER — Ambulatory Visit (HOSPITAL_COMMUNITY): Payer: Medicare Other | Admitting: Certified Registered"

## 2014-01-28 ENCOUNTER — Ambulatory Visit (HOSPITAL_COMMUNITY): Payer: Medicare Other

## 2014-01-28 ENCOUNTER — Observation Stay (HOSPITAL_COMMUNITY)
Admission: RE | Admit: 2014-01-28 | Discharge: 2014-01-31 | Disposition: A | Discharge status: Home / Self Care | Payer: Medicare Other | Source: Ambulatory Visit | Attending: Orthopedic Surgery | Admitting: Orthopedic Surgery

## 2014-01-28 ENCOUNTER — Encounter (HOSPITAL_COMMUNITY): Payer: Medicare Other | Admitting: Certified Registered"

## 2014-01-28 ENCOUNTER — Encounter (HOSPITAL_COMMUNITY): Payer: Self-pay | Admitting: *Deleted

## 2014-01-28 DIAGNOSIS — M5126 Other intervertebral disc displacement, lumbar region: Secondary | ICD-10-CM | POA: Diagnosis not present

## 2014-01-28 DIAGNOSIS — IMO0002 Reserved for concepts with insufficient information to code with codable children: Secondary | ICD-10-CM | POA: Insufficient documentation

## 2014-01-28 DIAGNOSIS — I2510 Atherosclerotic heart disease of native coronary artery without angina pectoris: Secondary | ICD-10-CM | POA: Insufficient documentation

## 2014-01-28 DIAGNOSIS — J449 Chronic obstructive pulmonary disease, unspecified: Secondary | ICD-10-CM | POA: Insufficient documentation

## 2014-01-28 DIAGNOSIS — J4489 Other specified chronic obstructive pulmonary disease: Secondary | ICD-10-CM | POA: Insufficient documentation

## 2014-01-28 DIAGNOSIS — Z87891 Personal history of nicotine dependence: Secondary | ICD-10-CM | POA: Insufficient documentation

## 2014-01-28 DIAGNOSIS — I10 Essential (primary) hypertension: Secondary | ICD-10-CM | POA: Diagnosis not present

## 2014-01-28 DIAGNOSIS — Z79899 Other long term (current) drug therapy: Secondary | ICD-10-CM | POA: Diagnosis not present

## 2014-01-28 DIAGNOSIS — K219 Gastro-esophageal reflux disease without esophagitis: Secondary | ICD-10-CM | POA: Diagnosis not present

## 2014-01-28 DIAGNOSIS — M48062 Spinal stenosis, lumbar region with neurogenic claudication: Secondary | ICD-10-CM | POA: Diagnosis present

## 2014-01-28 DIAGNOSIS — Z7982 Long term (current) use of aspirin: Secondary | ICD-10-CM | POA: Insufficient documentation

## 2014-01-28 HISTORY — PX: LUMBAR LAMINECTOMY/DECOMPRESSION MICRODISCECTOMY: SHX5026

## 2014-01-28 SURGERY — LUMBAR LAMINECTOMY/DECOMPRESSION MICRODISCECTOMY 1 LEVEL
Anesthesia: General | Laterality: Left

## 2014-01-28 MED ORDER — BUPIVACAINE-EPINEPHRINE (PF) 0.25% -1:200000 IJ SOLN
INTRAMUSCULAR | Status: DC | PRN
  Administered 2014-01-28: 20 mL

## 2014-01-28 MED ORDER — BENAZEPRIL-HYDROCHLOROTHIAZIDE 20-12.5 MG PO TABS: 1.0000 | ORAL_TABLET | Freq: Every morning | ORAL | Status: DC

## 2014-01-28 MED ORDER — FENTANYL CITRATE 0.05 MG/ML IJ SOLN
INTRAMUSCULAR | Status: DC | PRN
  Administered 2014-01-28 (×2): 50 ug via INTRAVENOUS
  Administered 2014-01-28: 100 ug via INTRAVENOUS
  Administered 2014-01-28: 50 ug via INTRAVENOUS

## 2014-01-28 MED ORDER — SODIUM CHLORIDE 0.9 % IJ SOLN
INTRAMUSCULAR | Status: AC
  Filled 2014-01-28: qty 10

## 2014-01-28 MED ORDER — PROMETHAZINE HCL 25 MG/ML IJ SOLN: 6.2500 mg | INTRAMUSCULAR | Status: DC | PRN

## 2014-01-28 MED ORDER — MENTHOL 3 MG MT LOZG
1.0000 | LOZENGE | OROMUCOSAL | Status: DC | PRN
  Filled 2014-01-28: qty 9

## 2014-01-28 MED ORDER — POLYETHYLENE GLYCOL 3350 17 G PO PACK
17.0000 g | PACK | Freq: Every day | ORAL | Status: DC | PRN
  Administered 2014-01-30: 17 g via ORAL
  Filled 2014-01-28: qty 1

## 2014-01-28 MED ORDER — PROPOFOL 10 MG/ML IV BOLUS
INTRAVENOUS | Status: AC
  Filled 2014-01-28: qty 20

## 2014-01-28 MED ORDER — EPHEDRINE SULFATE 50 MG/ML IJ SOLN
INTRAMUSCULAR | Status: DC | PRN
  Administered 2014-01-28: 10 mg via INTRAVENOUS

## 2014-01-28 MED ORDER — OXYCODONE HCL 5 MG/5ML PO SOLN: 5.0000 mg | Freq: Once | ORAL | Status: DC | PRN

## 2014-01-28 MED ORDER — CLINDAMYCIN PHOSPHATE 900 MG/50ML IV SOLN
INTRAVENOUS | Status: AC
  Filled 2014-01-28: qty 50

## 2014-01-28 MED ORDER — HYDROMORPHONE HCL PF 1 MG/ML IJ SOLN
INTRAMUSCULAR | Status: AC
  Filled 2014-01-28: qty 1

## 2014-01-28 MED ORDER — BUPIVACAINE LIPOSOME 1.3 % IJ SUSP
INTRAMUSCULAR | Status: DC | PRN
  Administered 2014-01-28: 20 mL

## 2014-01-28 MED ORDER — ACETAMINOPHEN 650 MG RE SUPP: 650.0000 mg | RECTAL | Status: DC | PRN

## 2014-01-28 MED ORDER — ALUM & MAG HYDROXIDE-SIMETH 200-200-20 MG/5ML PO SUSP
30.0000 mL | Freq: Three times a day (TID) | ORAL | Status: DC | PRN
  Administered 2014-01-28: 30 mL via ORAL
  Filled 2014-01-28: qty 30

## 2014-01-28 MED ORDER — ROCURONIUM BROMIDE 100 MG/10ML IV SOLN
INTRAVENOUS | Status: DC | PRN
  Administered 2014-01-28: 40 mg via INTRAVENOUS

## 2014-01-28 MED ORDER — BISACODYL 5 MG PO TBEC
5.0000 mg | DELAYED_RELEASE_TABLET | Freq: Every day | ORAL | Status: DC | PRN
  Administered 2014-01-31: 5 mg via ORAL
  Filled 2014-01-28: qty 1

## 2014-01-28 MED ORDER — HYDROCODONE-ACETAMINOPHEN 7.5-325 MG PO TABS
1.0000 | ORAL_TABLET | Freq: Four times a day (QID) | ORAL | Status: DC | PRN
  Administered 2014-01-29 – 2014-01-31 (×5): 1 via ORAL
  Filled 2014-01-28 (×5): qty 1

## 2014-01-28 MED ORDER — ROCURONIUM BROMIDE 100 MG/10ML IV SOLN
INTRAVENOUS | Status: AC
  Filled 2014-01-28: qty 1

## 2014-01-28 MED ORDER — PROPOFOL 10 MG/ML IV BOLUS
INTRAVENOUS | Status: DC | PRN
  Administered 2014-01-28: 150 mg via INTRAVENOUS

## 2014-01-28 MED ORDER — BUPIVACAINE LIPOSOME 1.3 % IJ SUSP
20.0000 mL | Freq: Once | INTRAMUSCULAR | Status: DC
  Filled 2014-01-28: qty 20

## 2014-01-28 MED ORDER — OXYCODONE HCL 5 MG PO TABS: 5.0000 mg | ORAL_TABLET | Freq: Once | ORAL | Status: DC | PRN

## 2014-01-28 MED ORDER — ONDANSETRON HCL 4 MG/2ML IJ SOLN
INTRAMUSCULAR | Status: DC | PRN
  Administered 2014-01-28: 4 mg via INTRAVENOUS

## 2014-01-28 MED ORDER — MEPERIDINE HCL 50 MG/ML IJ SOLN: 6.2500 mg | INTRAMUSCULAR | Status: DC | PRN

## 2014-01-28 MED ORDER — METHOCARBAMOL 500 MG PO TABS
500.0000 mg | ORAL_TABLET | Freq: Four times a day (QID) | ORAL | Status: DC | PRN
  Administered 2014-01-29 – 2014-01-31 (×4): 500 mg via ORAL
  Filled 2014-01-28 (×5): qty 1

## 2014-01-28 MED ORDER — CLINDAMYCIN PHOSPHATE 900 MG/50ML IV SOLN
900.0000 mg | INTRAVENOUS | Status: AC
  Administered 2014-01-28: 900 mg via INTRAVENOUS

## 2014-01-28 MED ORDER — DEXAMETHASONE SODIUM PHOSPHATE 10 MG/ML IJ SOLN
INTRAMUSCULAR | Status: DC | PRN
  Administered 2014-01-28: 10 mg via INTRAVENOUS

## 2014-01-28 MED ORDER — THROMBIN 5000 UNITS EX SOLR
CUTANEOUS | Status: DC | PRN
  Administered 2014-01-28: 10000 [IU] via TOPICAL

## 2014-01-28 MED ORDER — LIDOCAINE HCL (PF) 2 % IJ SOLN
INTRAMUSCULAR | Status: DC | PRN
  Administered 2014-01-28: 20 mg via INTRADERMAL

## 2014-01-28 MED ORDER — EPHEDRINE SULFATE 50 MG/ML IJ SOLN
INTRAMUSCULAR | Status: AC
  Filled 2014-01-28: qty 1

## 2014-01-28 MED ORDER — FLEET ENEMA 7-19 GM/118ML RE ENEM: 1.0000 | ENEMA | Freq: Once | RECTAL | Status: AC | PRN

## 2014-01-28 MED ORDER — ONDANSETRON HCL 4 MG/2ML IJ SOLN
INTRAMUSCULAR | Status: AC
  Filled 2014-01-28: qty 2

## 2014-01-28 MED ORDER — THROMBIN 5000 UNITS EX SOLR
CUTANEOUS | Status: AC
  Filled 2014-01-28: qty 10000

## 2014-01-28 MED ORDER — FENTANYL CITRATE 0.05 MG/ML IJ SOLN
INTRAMUSCULAR | Status: AC
  Filled 2014-01-28: qty 5

## 2014-01-28 MED ORDER — BUPIVACAINE-EPINEPHRINE (PF) 0.25% -1:200000 IJ SOLN
INTRAMUSCULAR | Status: AC
  Filled 2014-01-28: qty 30

## 2014-01-28 MED ORDER — HYDROMORPHONE HCL PF 1 MG/ML IJ SOLN
0.2500 mg | INTRAMUSCULAR | Status: DC | PRN
Start: 2014-01-28 — End: 2014-01-28
  Administered 2014-01-28 (×4): 0.5 mg via INTRAVENOUS

## 2014-01-28 MED ORDER — CLINDAMYCIN PHOSPHATE 600 MG/50ML IV SOLN
600.0000 mg | Freq: Three times a day (TID) | INTRAVENOUS | Status: DC
  Administered 2014-01-28 – 2014-01-30 (×6): 600 mg via INTRAVENOUS
  Filled 2014-01-28 (×6): qty 50

## 2014-01-28 MED ORDER — LACTATED RINGERS IV SOLN
INTRAVENOUS | Status: DC
  Administered 2014-01-28: 1000 mL via INTRAVENOUS

## 2014-01-28 MED ORDER — ONDANSETRON HCL 4 MG/2ML IJ SOLN
4.0000 mg | INTRAMUSCULAR | Status: DC | PRN
  Administered 2014-01-29: 4 mg via INTRAVENOUS
  Filled 2014-01-28: qty 2

## 2014-01-28 MED ORDER — HYDROCHLOROTHIAZIDE 12.5 MG PO CAPS
12.5000 mg | ORAL_CAPSULE | Freq: Every day | ORAL | Status: DC
  Administered 2014-01-28 – 2014-01-31 (×4): 12.5 mg via ORAL
  Filled 2014-01-28 (×4): qty 1

## 2014-01-28 MED ORDER — HYDROMORPHONE HCL PF 1 MG/ML IJ SOLN
0.5000 mg | INTRAMUSCULAR | Status: DC | PRN
  Administered 2014-01-28: 1 mg via INTRAVENOUS
  Administered 2014-01-28: 0.5 mg via INTRAVENOUS
  Administered 2014-01-28 – 2014-01-31 (×14): 1 mg via INTRAVENOUS
  Filled 2014-01-28 (×16): qty 1

## 2014-01-28 MED ORDER — LACTATED RINGERS IV SOLN
INTRAVENOUS | Status: DC
  Administered 2014-01-29 – 2014-01-30 (×2): via INTRAVENOUS

## 2014-01-28 MED ORDER — BENAZEPRIL HCL 20 MG PO TABS
20.0000 mg | ORAL_TABLET | Freq: Every day | ORAL | Status: DC
  Administered 2014-01-28 – 2014-01-31 (×4): 20 mg via ORAL
  Filled 2014-01-28 (×4): qty 1

## 2014-01-28 MED ORDER — MIDAZOLAM HCL 5 MG/5ML IJ SOLN
INTRAMUSCULAR | Status: DC | PRN
  Administered 2014-01-28 (×2): 1 mg via INTRAVENOUS

## 2014-01-28 MED ORDER — MIDAZOLAM HCL 2 MG/2ML IJ SOLN
INTRAMUSCULAR | Status: AC
  Filled 2014-01-28: qty 2

## 2014-01-28 MED ORDER — SODIUM CHLORIDE 0.9 % IR SOLN
Status: DC | PRN
  Administered 2014-01-28: 10:00:00

## 2014-01-28 MED ORDER — BACITRACIN ZINC 500 UNIT/GM EX OINT
TOPICAL_OINTMENT | CUTANEOUS | Status: AC
  Filled 2014-01-28: qty 28.35

## 2014-01-28 MED ORDER — BACITRACIN-NEOMYCIN-POLYMYXIN 400-5-5000 EX OINT
TOPICAL_OINTMENT | CUTANEOUS | Status: DC | PRN
  Administered 2014-01-28: 1 via TOPICAL

## 2014-01-28 MED ORDER — ACETAMINOPHEN 325 MG PO TABS
650.0000 mg | ORAL_TABLET | ORAL | Status: DC | PRN
  Administered 2014-01-30: 650 mg via ORAL
  Filled 2014-01-28: qty 2

## 2014-01-28 MED ORDER — PHENOL 1.4 % MT LIQD: 1.0000 | OROMUCOSAL | Status: DC | PRN

## 2014-01-28 MED ORDER — LACTATED RINGERS IV SOLN
INTRAVENOUS | Status: DC
  Administered 2014-01-28: 1000 mL via INTRAVENOUS
  Administered 2014-01-28: 08:00:00 via INTRAVENOUS

## 2014-01-28 MED ORDER — METHOCARBAMOL 1000 MG/10ML IJ SOLN
500.0000 mg | Freq: Four times a day (QID) | INTRAVENOUS | Status: DC | PRN
  Administered 2014-01-28: 500 mg via INTRAVENOUS
  Filled 2014-01-28: qty 5

## 2014-01-28 SURGICAL SUPPLY — 40 items
BAG SPEC THK2 15X12 ZIP CLS (MISCELLANEOUS) ×1
BAG ZIPLOCK 12X15 (MISCELLANEOUS) ×3 IMPLANT
CLEANER TIP ELECTROSURG 2X2 (MISCELLANEOUS) ×3 IMPLANT
DRAIN PENROSE 18X1/4 LTX STRL (WOUND CARE) IMPLANT
DRAPE LG THREE QUARTER DISP (DRAPES) ×4 IMPLANT
DRAPE MICROSCOPE LEICA (MISCELLANEOUS) ×3 IMPLANT
DRAPE POUCH INSTRU U-SHP 10X18 (DRAPES) ×3 IMPLANT
DRAPE SURG 17X11 SM STRL (DRAPES) ×3 IMPLANT
DRSG ADAPTIC 3X8 NADH LF (GAUZE/BANDAGES/DRESSINGS) ×3 IMPLANT
DRSG PAD ABDOMINAL 8X10 ST (GAUZE/BANDAGES/DRESSINGS) ×6 IMPLANT
DURAPREP 26ML APPLICATOR (WOUND CARE) ×3 IMPLANT
ELECT BLADE TIP CTD 4 INCH (ELECTRODE) ×3 IMPLANT
ELECT REM PT RETURN 9FT ADLT (ELECTROSURGICAL) ×3
ELECTRODE REM PT RTRN 9FT ADLT (ELECTROSURGICAL) ×1 IMPLANT
GLOVE BIOGEL PI IND STRL 8 (GLOVE) ×1 IMPLANT
GLOVE BIOGEL PI INDICATOR 8 (GLOVE) ×4
GLOVE ECLIPSE 8.0 STRL XLNG CF (GLOVE) ×7 IMPLANT
GOWN STRL REUS W/TWL XL LVL3 (GOWN DISPOSABLE) ×8 IMPLANT
KIT BASIN OR (CUSTOM PROCEDURE TRAY) ×3 IMPLANT
KIT POSITIONING SURG ANDREWS (MISCELLANEOUS) ×3 IMPLANT
MANIFOLD NEPTUNE II (INSTRUMENTS) ×3 IMPLANT
NDL SPNL 18GX3.5 QUINCKE PK (NEEDLE) ×2 IMPLANT
NEEDLE SPNL 18GX3.5 QUINCKE PK (NEEDLE) ×9 IMPLANT
PATTIES SURGICAL .5 X.5 (GAUZE/BANDAGES/DRESSINGS) ×2 IMPLANT
PATTIES SURGICAL .75X.75 (GAUZE/BANDAGES/DRESSINGS) IMPLANT
PATTIES SURGICAL 1X1 (DISPOSABLE) IMPLANT
PIN SAFETY NICK PLATE  2 MED (MISCELLANEOUS)
PIN SAFETY NICK PLATE 2 MED (MISCELLANEOUS) IMPLANT
POSITIONER SURGICAL ARM (MISCELLANEOUS) ×3 IMPLANT
SPONGE GAUZE 4X4 12PLY (GAUZE/BANDAGES/DRESSINGS) ×3 IMPLANT
SPONGE LAP 4X18 X RAY DECT (DISPOSABLE) IMPLANT
SPONGE SURGIFOAM ABS GEL 100 (HEMOSTASIS) ×3 IMPLANT
STAPLER VISISTAT 35W (STAPLE) ×3 IMPLANT
SUT VIC AB 0 CT1 27 (SUTURE)
SUT VIC AB 0 CT1 27XBRD ANTBC (SUTURE) ×1 IMPLANT
SUT VIC AB 1 CT1 27 (SUTURE) ×9
SUT VIC AB 1 CT1 27XBRD ANTBC (SUTURE) ×3 IMPLANT
SYR 20CC LL (SYRINGE) ×3 IMPLANT
TOWEL OR 17X26 10 PK STRL BLUE (TOWEL DISPOSABLE) ×3 IMPLANT
TRAY LAMINECTOMY (CUSTOM PROCEDURE TRAY) ×3 IMPLANT

## 2014-01-28 NOTE — Transfer of Care (Signed)
Immediate Anesthesia Transfer of Care Note  Patient: Christopher Dalton  Procedure(s) Performed: Procedure(s) (LRB): Complete DECOMPRESSION lumbar laminectomy for spinal stenosis,  MICRODISCECTOMY  L3-4 ON LEFT for foraminotomy for L3-L4 nerve root (Left)  Patient Location: PACU  Anesthesia Type: General  Level of Consciousness: sedated, patient cooperative and responds to stimulation  Airway & Oxygen Therapy: Patient Spontanous Breathing and Patient connected to face mask oxgen  Post-op Assessment: Report given to PACU RN and Post -op Vital signs reviewed and stable  Post vital signs: Reviewed and stable  Complications: No apparent anesthesia complications

## 2014-01-28 NOTE — H&P (Signed)
Christopher Dalton is an 73 y.o. male.   Chief Complaint: Pain in his left lower extremity and weakness. HPI: Patient developed weakness and severe pain in his left lower extremity.  Past Medical History  Diagnosis Date  . IMPAIRED GLUCOSE TOLERANCE 03/02/2008  . DEPRESSION 11/08/2009  . HYPERTENSION 01/21/2007  . C A D 11/15/2009  . HYPOTENSION 04/13/2009  . COPD 01/21/2007  . INGUINAL HERNIA, RIGHT 03/25/2007  . OSTEOARTHRITIS 01/21/2007  . HIP PAIN, LEFT 10/29/2008  . LOW BACK PAIN 01/21/2007  . GERD (gastroesophageal reflux disease)     Past Surgical History  Procedure Laterality Date  . Tonsillectomy  unknown  . Reconstruction mid-face    . Inguinal hernia repair  2008    right  . Fracture surgery  1958    face    Family History  Problem Relation Age of Onset  . Cancer Mother     lung ca  . Cancer Father     unclear type   Social History:  reports that he has quit smoking. He has quit using smokeless tobacco. He reports that he drinks alcohol. He reports that he does not use illicit drugs.  Allergies:  Allergies  Allergen Reactions  . Amoxicillin     REACTION: unspecified  . Barbiturates     REACTION: tongue swells, hands swell  . Doxycycline Other (See Comments)    unknown  . Pravastatin   . Sulfamethoxazole     REACTION: unspecified  . Statins Other (See Comments)    mucsle pain    Medications Prior to Admission  Medication Sig Dispense Refill  . benazepril-hydrochlorthiazide (LOTENSIN HCT) 20-12.5 MG per tablet Take 1 tablet by mouth every morning.      . loratadine (CLARITIN) 10 MG tablet Take 10 mg by mouth daily.        . predniSONE (DELTASONE) 5 MG tablet Take 5 mg by mouth daily with breakfast.      . aspirin 81 MG tablet Take 81 mg by mouth daily.        Marland Kitchen. CRANBERRY PO Take 1 tablet by mouth daily.      Marland Kitchen. HYDROcodone-acetaminophen (NORCO) 7.5-325 MG per tablet Take 1 tablet by mouth every 6 (six) hours as needed for moderate pain.      . Multiple Vitamin  (MULTIVITAMIN WITH MINERALS) TABS tablet Take 1 tablet by mouth daily.      . naproxen sodium (ANAPROX) 220 MG tablet Take 220 mg by mouth 2 (two) times daily as needed (for pain).        No results found for this or any previous visit (from the past 48 hour(s)). No results found.  Review of Systems  Constitutional: Negative.   HENT: Negative.   Eyes: Negative.   Respiratory: Positive for shortness of breath.   Cardiovascular: Negative.   Gastrointestinal: Negative.   Genitourinary: Negative.   Musculoskeletal:       Positive Straight Leg Raising on the Left and Left leg pain with walking.  Skin: Negative.   Neurological: Positive for focal weakness.  Endo/Heme/Allergies: Negative.   Psychiatric/Behavioral: Negative.     Blood pressure 154/80, pulse 102, temperature 98.8 F (37.1 C), temperature source Oral, resp. rate 18, height 6\' 3"  (1.905 m), weight 87.544 kg (193 lb), SpO2 98.00%. Physical Exam  Constitutional: He appears well-developed.  HENT:  Head: Normocephalic.  Eyes: Pupils are equal, round, and reactive to light.  Neck: Normal range of motion.  Cardiovascular: Normal rate.   Respiratory:  Decreased breath  sounds throughout.  GI: Soft.  Musculoskeletal:  Positive straight leg raising on the left with weakness of left hip flexors.  Neurological:  Weakness of left hip flexors.  Skin: Skin is warm.  Psychiatric: He has a normal mood and affect.     Assessment/Plan Central decompression and microdiscectomy at L-3-L-4.  Dinnis Rog A 01/28/2014, 8:20 AM

## 2014-01-28 NOTE — Anesthesia Preprocedure Evaluation (Signed)
Anesthesia Evaluation  Patient identified by MRN, date of birth, ID band Patient awake    Reviewed: Allergy & Precautions, H&P , NPO status , Patient's Chart, lab work & pertinent test results  Airway Mallampati: II TM Distance: >3 FB Neck ROM: Full    Dental no notable dental hx.    Pulmonary COPDformer smoker,  breath sounds clear to auscultation  Pulmonary exam normal       Cardiovascular hypertension, Pt. on medications + CAD Rhythm:Regular Rate:Normal     Neuro/Psych PSYCHIATRIC DISORDERS Depression negative neurological ROS     GI/Hepatic Neg liver ROS, GERD-  ,  Endo/Other  negative endocrine ROS  Renal/GU negative Renal ROS     Musculoskeletal negative musculoskeletal ROS (+)   Abdominal   Peds  Hematology negative hematology ROS (+)   Anesthesia Other Findings   Reproductive/Obstetrics                           Anesthesia Physical Anesthesia Plan  ASA: III  Anesthesia Plan: General   Post-op Pain Management:    Induction: Intravenous  Airway Management Planned: Oral ETT  Additional Equipment:   Intra-op Plan:   Post-operative Plan: Extubation in OR  Informed Consent: I have reviewed the patients History and Physical, chart, labs and discussed the procedure including the risks, benefits and alternatives for the proposed anesthesia with the patient or authorized representative who has indicated his/her understanding and acceptance.   Dental advisory given  Plan Discussed with: CRNA  Anesthesia Plan Comments:         Anesthesia Quick Evaluation

## 2014-01-28 NOTE — Op Note (Signed)
NAMCalton Dalton:  Dalton, Christopher                ACCOUNT NO.:  [id number]  MEDICAL RECORD NO.:  [id number]  LOCATION:  [facility]                         [facility]  PHYSICIAN:  Georges Lynchonald A. Keyah Blizard, M.D.DATE OF BIRTH:  [birth year]  DATE OF PROCEDURE:  01/28/2014 DATE OF DISCHARGE:                              OPERATIVE REPORT   SURGEON:  Georges Lynchonald A. Darrelyn HillockGioffre, MD  ASSISTANT:  Marlowe KaysJames Aplington, MD  PREOPERATIVE DIAGNOSES: 1. Severe spinal stenosis at L3-4. 2. Large herniated disk at L3-4 on the left, compressing the L4 root. 3. Foraminal stenosis at L3 nerve root. 4. Foraminal stenosis of the L4 nerve root on the left.  POSTOPERATIVE DIAGNOSES: 1. Severe spinal stenosis at L3-4. 2. Large herniated disk at L3-4 on the left, compressing the L4 root. 3. Foraminal stenosis at L3 nerve root. 4. Foraminal stenosis of the L4 nerve root on the left.  OPERATION: 1. Complete decompressive lumbar laminectomy at L3-4 for spinal     stenosis. 2. Microdiskectomy at L3-4 on the left for herniated lumbar disk. 3. Foraminotomy for the L3 root on the left. 4. Foraminotomy for the L4 root on the left.  DESCRIPTION OF PROCEDURE:  Under general anesthesia with the patient on a spinal frame, routine orthopedic prep and draping of the lower back was carried out.  At this time, the appropriate time-out was first carried out.  I also marked the appropriate left side in the holding area although we went central.  The left side was marked because that is where his symptoms were and his disk herniation was.  Two needles were placed in the back for localization purposes.  X-ray was taken. Following that, an incision was made over the L3-4 and 4-5 region. Bleeders were identified and cauterized.  The muscle was stripped from the lamina and spinous process bilaterally.  Good hemostasis was maintained.  Two Kocher clamps were placed and another x-ray was taken. Following that, I then removed the spinous process of L3, part of  4, went down and did a complete central decompressive lumbar laminectomy. The microscope was brought in.  At this particular time, good hemostasis was maintained.  Great care was taken not to injure the underlying dura. Note, he was quite tight at the 3-4 level we thought. He had marked overgrowth of the ligamentum flavum as well.  We then completed our decompression at L3-4.  We removed the ligamentum flavum with great care taken to preserve the dura.  We then went far out laterally on the left where his symptoms were present where the herniated disk was.  We did a nice decompression of the lateral recess.  We did foraminotomies for the L3 root and the 4 root.  We then gently retracted the L4 root and identified a large herniated disk.  The needle was  placed in this leg and another x-ray was taken.  Following that, a cruciate incision was made in the posterior longitudinal ligament and I teased a large amount of disk material out with a nerve hook, and at same time protecting the nerve root.  We then went in with the pituitary and completed the diskectomy.  We went down into the foramen of the  4 root as well and decompressed that as well.  We went up under the root with a nerve hook and removed the disk material.  Specimen was sent to the lab.  We thoroughly irrigated out the area.  We utilized a hockey-stick to make sure we had complete freedom now of both roots on the left, the L3 and the L4 roots.  We did nice foraminotomies as well.  The dura was free. We had good hemostasis.  We thoroughly irrigated out the area loosely, applied some thrombin-soaked Gelfoam, and I closed the wound with layers in usual fashion.  I left a small deep distal and proximal part of the wound open for drainage purposes.  Subcu was closed with #1 Vicryl, the skin was later closed with staples.  I first injected 20 mL of Exparel at the end of the case into the soft tissue structures.  The patient  had clindamycin 900 mg preop.          ______________________________ Georges Lynch. Darrelyn Hillock, M.D.     RAG/MEDQ  D:  01/28/2014  T:  01/28/2014  Job:  [id number]

## 2014-01-28 NOTE — Brief Op Note (Signed)
01/28/2014  11:03 AM  PATIENT:  Christopher Dalton  73 y.o. male  PRE-OPERATIVE DIAGNOSIS:  LUMBER SPINAL STENOSIS and Herniated Lumbar Disc at L-3-L-4 on the Left.Foraminal Stenosis for L-3 and L-4 nerve roots on the Left.  POST-OPERATIVE DIAGNOSIS: Same as Pre-Op.  PROCEDURE:  Procedure(s): Complete DECOMPRESSION lumbar laminectomy for spinal stenosis,  MICRODISCECTOMY  L3-4 ON LEFT for foraminotomy for L3-L4 nerve root (Left).Stenosis was at L-3-L-4. Two Nerve Roots on the Left were Decompressed.  SURGEON:  Surgeon(s) and Role:    * Jacki Conesonald A Amberrose Friebel, MD - Primary    * Drucilla SchmidtJames P Aplington, MD - Assisting    ASSISTANTS: Marlowe KaysJames Aplington MD  ANESTHESIA:   general  EBL:  Total I/O In: -  Out: 175 [Urine:150; Blood:25]  BLOOD ADMINISTERED:none  DRAINS: none   LOCAL MEDICATIONS USED:  MARCAINE 20cc of 0.25% with Epinephrine.at start of the case and 20cc of Exparel at the end of the case.   SPECIMEN:  Source of Specimen:  L-3-L-4 disc space.  DISPOSITION OF SPECIMEN:  PATHOLOGY  COUNTS:  YES  TOURNIQUET:  * No tourniquets in log *  DICTATION: .Other Dictation: Dictation Number [id number]  PLAN OF CARE: Admit for overnight observation  PATIENT DISPOSITION:  PACU - hemodynamically stable.   Delay start of Pharmacological VTE agent (>24hrs) due to surgical blood loss or risk of bleeding: yes

## 2014-01-28 NOTE — Interval H&P Note (Signed)
History and Physical Interval Note:  01/28/2014 8:30 AM  Christopher Dalton  has presented today for surgery, with the diagnosis of LUMBER SPINAL STENOSIS  The various methods of treatment have been discussed with the patient and family. After consideration of risks, benefits and other options for treatment, the patient has consented to  Procedure(s): LUMBAR /DECOMPRESSION L3-L4 WITH MICRODISCECTOMY  L3-4 ON LEFT (Left) as a surgical intervention .  The patient's history has been reviewed, patient examined, no change in status, stable for surgery.  I have reviewed the patient's chart and labs.  Questions were answered to the patient's satisfaction.     [name] A

## 2014-01-28 NOTE — Anesthesia Postprocedure Evaluation (Signed)
Anesthesia Post Note  Patient: Christopher Dalton  Procedure(s) Performed: Procedure(s) (LRB): Complete DECOMPRESSION lumbar laminectomy for spinal stenosis,  MICRODISCECTOMY  L3-4 ON LEFT for foraminotomy for L3-L4 nerve root (Left)  Anesthesia type: General  Patient location: PACU  Post pain: Pain level controlled  Post assessment: Post-op Vital signs reviewed  Last Vitals: BP 152/72  Pulse 88  Temp(Src) 37.1 C (Oral)  Resp 16  Ht 6\' 3"  (1.905 m)  Wt 193 lb (87.544 kg)  BMI 24.12 kg/m2  SpO2 100%  Post vital signs: Reviewed  Level of consciousness: sedated  Complications: No apparent anesthesia complications

## 2014-01-29 ENCOUNTER — Other Ambulatory Visit: Payer: Self-pay | Admitting: Internal Medicine

## 2014-01-29 ENCOUNTER — Encounter (HOSPITAL_COMMUNITY): Payer: Self-pay | Admitting: Orthopedic Surgery

## 2014-01-29 DIAGNOSIS — M5126 Other intervertebral disc displacement, lumbar region: Secondary | ICD-10-CM | POA: Diagnosis not present

## 2014-01-29 MED ORDER — TAMSULOSIN HCL 0.4 MG PO CAPS
0.4000 mg | ORAL_CAPSULE | Freq: Every day | ORAL | Status: DC
  Administered 2014-01-29: 0.4 mg via ORAL
  Filled 2014-01-29 (×2): qty 1

## 2014-01-29 MED ORDER — PREDNISONE 5 MG PO TABS
5.0000 mg | ORAL_TABLET | Freq: Every day | ORAL | Status: DC
  Administered 2014-01-29 – 2014-01-31 (×3): 5 mg via ORAL
  Filled 2014-01-29 (×5): qty 1

## 2014-01-29 NOTE — Progress Notes (Addendum)
Patient unable to void after 6 hours. Foley catheter reinserted per MD standing order. 200 cc of urine immediate return after foley inserted. Patient tolerated well. Will continue to monitor patient.

## 2014-01-29 NOTE — Progress Notes (Signed)
Subjective: 1 Day Post-Op Procedure(s) (LRB): Complete DECOMPRESSION lumbar laminectomy for spinal stenosis,  MICRODISCECTOMY  L3-4 ON LEFT for foraminotomy for L3-L4 nerve root (Left) Patient reports pain as 5 on 0-10 scale. Will keep him today for PT and pain control. He had significant Stenosis and a HNP. Plan on PT today and DC tomorrow.   Objective: Vital signs in last 24 hours: Temp:  [97.7 F (36.5 C)-99.5 F (37.5 C)] 99 F (37.2 C) (07/23 [id number]) Pulse Rate:  [77-112] 79 (07/23 0614) Resp:  [12-19] 18 (07/23 0614) BP: (123-161)/(63-88) 123/72 mmHg (07/23 0614) SpO2:  [98 %-100 %] 98 % (07/23 0614) Weight:  [87.544 kg (193 lb)] 87.544 kg (193 lb) (07/22 0724)  Intake/Output from previous day: 07/22 0701 - 07/23 0700 In: 5013.3 [P.O.:1800; I.V.:3008.3; IV Piggyback:205] Out: 2875 [Urine:2850; Blood:25] Intake/Output this shift:    No results found for this basename: HGB,  in the last 72 hours No results found for this basename: WBC, RBC, HCT, PLT,  in the last 72 hours No results found for this basename: NA, K, CL, CO2, BUN, CREATININE, GLUCOSE, CALCIUM,  in the last 72 hours No results found for this basename: LABPT, INR,  in the last 72 hours  Dorsiflexion/Plantar flexion intact  Assessment/Plan: 1 Day Post-Op Procedure(s) (LRB): Complete DECOMPRESSION lumbar laminectomy for spinal stenosis,  MICRODISCECTOMY  L3-4 ON LEFT for foraminotomy for L3-L4 nerve root (Left) Up with therapy. Plan on DC tomorrow.  Leane Loring A 01/29/2014, 7:09 AM

## 2014-01-29 NOTE — Evaluation (Signed)
Occupational Therapy Evaluation Patient Details Name: Christopher Dalton MRN: [id number] DOB: [birth year] Today's Date: 01/29/2014    History of Present Illness 73 yo male s/p laminectomy, microdiscectomy L3-L4 7/22.    Clinical Impression   Pt was admitted for the above surgery.  He will benefit from skilled OT to increase safety and independence with adls following back precautions.  Goals in acute are for overall min A (but this does not reflect donning socks and shoes).  Pt was mod I prior to admission--he states he did his own self care but struggled.    Follow Up Recommendations    supervision, 24/7 (as long as pt is safe to d/c home without buckling knee)   Equipment Recommendations    3:1 commode (pt is not sure he needs this, but he is over 6 feet and likely will need)   Recommendations for Other Services       Precautions / Restrictions Precautions Precautions: Back;Fall Restrictions Weight Bearing Restrictions: No      Mobility Bed Mobility Overal bed mobility: Needs Assistance Bed Mobility: Sit to Supine       Sit to supine: Mod assist   General bed mobility comments: assist for legs  Transfers Overall transfer level: Needs assistance Equipment used: None Transfers: Sit to/from Stand Sit to Stand: Min assist         General transfer comment: Pt did not want RW.  Extra time and cues for back precautions    Balance                                            ADL Overall ADL's : Needs assistance/impaired     Grooming: Set up;Sitting;Supervision/safety   Upper Body Bathing: Set up;Sitting;Supervision/ safety   Lower Body Bathing: Moderate assistance;Sit to/from stand   Upper Body Dressing : Set up;Sitting   Lower Body Dressing: Maximal assistance;Sit to/from stand   Toilet Transfer: Minimal assistance;Ambulation (chair to bed, about 3 feet apart) KNEE BUCKLED during transfer   Toileting- Clothing Manipulation and Hygiene:  Minimal assistance;Sit to/from stand         General ADL Comments: Reviewed safe ways to perform ADLs; pt has a reacher but hasn't used it for adls. Plan to return tomorrow for commode transfer:  will review and practice if pt will use this.       Vision                     Perception     Praxis      Pertinent Vitals/Pain 8/10 sitting; 10/10 with movement back to bed.  Pt was premedicated and repositioned     Hand Dominance     Extremity/Trunk Assessment Upper Extremity Assessment Upper Extremity Assessment: Overall WFL for tasks assessed   Lower Extremity Assessment Lower Extremity Assessment: Generalized weakness   Cervical / Trunk Assessment Cervical / Trunk Assessment: Kyphotic   Communication Communication Communication: No difficulties   Cognition Arousal/Alertness: Awake/alert Behavior During Therapy: WFL for tasks assessed/performed Overall Cognitive Status: Within Functional Limits for tasks assessed                     General Comments       Exercises       Shoulder Instructions      Home Living Family/patient expects to be discharged to:: Private residence Living Arrangements: Spouse/significant other  Type of Home: House Home Access: Stairs to enter Entergy CorporationEntrance Stairs-Number of Steps: 1-2 Entrance Stairs-Rails: None Home Layout: One level     Bathroom Shower/Tub: Tub/shower unit Shower/tub characteristics: Curtain FirefighterBathroom Toilet: Standard     Home Equipment: Environmental consultantWalker - 2 wheels;Shower seat          Prior Functioning/Environment Level of Independence: Independent             OT Diagnosis:     OT Problem List:     OT Treatment/Interventions:      OT Goals(Current goals can be found in the care plan section) Acute Rehab OT Goals Patient Stated Goal: less pain.  OT Goal Formulation: With patient Time For Goal Achievement: 02/05/14 Potential to Achieve Goals: Good ADL Goals Pt Will Perform Lower Body Bathing:  with min assist;sit to/from stand;with adaptive equipment Pt Will Perform Lower Body Dressing: with min assist;with adaptive equipment;sit to/from stand (pants only) Pt Will Transfer to Toilet: with min guard assist;ambulating;bedside commode Pt Will Perform Toileting - Clothing Manipulation and hygiene: with min guard assist;sit to/from stand Additional ADL Goal #1: pt will not need cues for back precautions during adls/toilet transfers  OT Frequency:     Barriers to D/C:            Co-evaluation              End of Session    Activity Tolerance: Patient tolerated treatment well Patient left: in bed;with call bell/phone within reach   Time: 1015-1033 OT Time Calculation (min): 18 min Charges:  OT General Charges $OT Visit: 1 Procedure OT Evaluation $Initial OT Evaluation Tier I: 1 Procedure OT Treatments $Self Care/Home Management : 8-22 mins G-Codes: OT G-codes **NOT FOR INPATIENT CLASS** Functional Assessment Tool Used: clinical observation Functional Limitation: Self care Self Care Current Status (W0981(G8987): At least 60 percent but less than 80 percent impaired, limited or restricted Self Care Goal Status (X9147(G8988): At least 40 percent but less than 60 percent impaired, limited or restricted  [facility] District[facility]ER,Abdur Hoglund 01/29/2014, 10:49 AM Marica OtterMaryellen Liticia Gasior, OTR/L [phone] 01/29/2014

## 2014-01-29 NOTE — Care Management Note (Addendum)
    Page 1 of 1   01/30/2014     1:35:56 PM CARE MANAGEMENT NOTE 01/30/2014  Patient:  Christopher Dalton,Christopher Dalton   Account Number:  [id number]  Date Initiated:  01/29/2014  Documentation initiated by:  Lanier ClamMAHABIR,Miquan Tandon  Subjective/Objective Assessment:   73 Y/O M ADMITTED W/SPINAL STENOSIS.     Action/Plan:   FROM HOME.   Anticipated DC Date:  01/31/2014   Anticipated DC Plan:  HOME W HOME HEALTH SERVICES      DC Planning Services  CM consult      Choice offered to / List presented to:  C-1 Patient           Status of service:  In process, will continue to follow Medicare Important Message given?   (If response is "NO", the following Medicare IM given date fields will be blank) Date Medicare IM given:   Medicare IM given by:   Date Additional Medicare IM given:   Additional Medicare IM given by:    Discharge Disposition:    Per UR Regulation:  Reviewed for med. necessity/level of care/duration of stay  If discussed at Long Length of Stay Meetings, dates discussed:    Comments:  01/30/14 [facility] CenterKATH[facility] Elvyn Krohn RN,BSN NCM [phone] [facility] CHOSEN FOR HHC.TC Christopher Dalton AWARE OF REFERRAL,& PATIENT'S REQUEST OF KNOWING HIS CO PAY-INFORMED Christopher SHE WILL TELL PATIENT.HHPT RECOMMENDED.PATIENT ALREADY HAS RW,WILL GET 3N1 ON OWN.AWAIT HHPT ORDER.  01/29/14 Brandee Markin RN,BSN NCM [phone] PROVIDED PATIENT W/HHC AGENCY LIST,STATES HE HAS A RW,& WILL GET 3N1 ON OWN. POD#1 LAMINECTOMY,MICRODISCECTOMY.PT-HH,RW,OT-24/7 SUPV,3N1.

## 2014-01-29 NOTE — Evaluation (Signed)
Physical Therapy Evaluation Patient Details Name: Christopher Dalton MRN: [id number] DOB: [birth year] Today's Date: 01/29/2014   History of Present Illness  73 yo male s/p laminectomy, microdiscectomy L3-L4 7/22.   Clinical Impression  On eval, pt required Min assist to stand and for ambulation-able to ambulate ~100 feet with rolling walker. Noted L LE to buckle x1 during ambulation. 5/10 pain at rest-increased with mobility/activity. Reviewed back precautions. May need to consider HHPT at discharge to aid with safety and mobility in home environment. Attempted to encourage pt to request pain pills (could possibly control pain for longer period)-pt declined this-prefers IV pain meds.     Follow Up Recommendations Home health PT;Supervision/Assistance - 24 hour    Equipment Recommendations  Rolling walker with 5" wheels    Recommendations for Other Services OT consult     Precautions / Restrictions Precautions Precautions: Back;Fall Restrictions Weight Bearing Restrictions: No      Mobility  Bed Mobility               General bed mobility comments: pt sitting in recliner at start of session  Transfers Overall transfer level: Needs assistance Equipment used: None Transfers: Sit to/from Stand           General transfer comment: Stood from recliner without AD. VCs safety, technique, hand placmenet. Increased time. Increased discomfort.   Ambulation/Gait Ambulation/Gait assistance: Min assist Ambulation Distance (Feet): 100 Feet Assistive device: Rolling walker (2 wheeled) Gait Pattern/deviations: Step-through pattern;Trunk flexed;Decreased stride length     General Gait Details: VC safety, posture, distance from walker. Distance lilmited by pain. Pt reported pain in back and L hip/LE.   Stairs            Wheelchair Mobility    Modified Rankin (Stroke Patients Only)       Balance                                             Pertinent  Vitals/Pain 5/10 at rest; 7/10 with activity. Made RN aware of need for pain meds.     Home Living Family/patient expects to be discharged to:: Private residence Living Arrangements: Spouse/significant other   Type of Home: House Home Access: Stairs to enter Entrance Stairs-Rails: None Entrance Stairs-Number of Steps: 1-2 Home Layout: One level Home Equipment: Environmental consultantWalker - 2 wheels      Prior Function Level of Independence: Independent               Hand Dominance        Extremity/Trunk Assessment   Upper Extremity Assessment: Defer to OT evaluation           Lower Extremity Assessment: Generalized weakness      Cervical / Trunk Assessment: Kyphotic  Communication   Communication: No difficulties  Cognition Arousal/Alertness: Awake/alert Behavior During Therapy: WFL for tasks assessed/performed Overall Cognitive Status: Within Functional Limits for tasks assessed                      General Comments      Exercises        Assessment/Plan    PT Assessment Patient needs continued PT services  PT Diagnosis Difficulty walking;Abnormality of gait;Acute pain   PT Problem List Decreased activity tolerance;Decreased balance;Decreased mobility;Pain;Decreased knowledge of use of DME;Decreased knowledge of precautions  PT Treatment Interventions DME instruction;Gait training;Functional mobility training;Therapeutic activities;Therapeutic  exercise;Patient/family education;Balance training   PT Goals (Current goals can be found in the Care Plan section) Acute Rehab PT Goals Patient Stated Goal: less pain.  PT Goal Formulation: With patient Time For Goal Achievement: 02/05/14 Potential to Achieve Goals: Good    Frequency Min 5X/week   Barriers to discharge        Co-evaluation               End of Session   Activity Tolerance: Patient limited by fatigue;Patient limited by pain Patient left: in chair;with call bell/phone within reach       Functional Assessment Tool Used: clinical judgement Functional Limitation: Mobility: Walking and moving around Mobility: Walking and Moving Around Current Status (Z6109): At least 20 percent but less than 40 percent impaired, limited or restricted Mobility: Walking and Moving Around Goal Status [phone]): At least 1 percent but less than 20 percent impaired, limited or restricted    Time: 0930-0943 PT Time Calculation (min): 13 min   Charges:   PT Evaluation $Initial PT Evaluation Tier I: 1 Procedure PT Treatments $Gait Training: 8-22 mins   PT G Codes:   Functional Assessment Tool Used: clinical judgement Functional Limitation: Mobility: Walking and moving around    [employer], MPT Pager: [phone]

## 2014-01-30 DIAGNOSIS — M5126 Other intervertebral disc displacement, lumbar region: Secondary | ICD-10-CM | POA: Diagnosis not present

## 2014-01-30 MED ORDER — TAMSULOSIN HCL 0.4 MG PO CAPS
0.4000 mg | ORAL_CAPSULE | Freq: Every day | ORAL | Status: DC
  Administered 2014-01-30 – 2014-01-31 (×2): 0.4 mg via ORAL
  Filled 2014-01-30 (×3): qty 1

## 2014-01-30 MED ORDER — TAMSULOSIN HCL 0.4 MG PO CAPS: 0.4000 mg | ORAL_CAPSULE | Freq: Every day | ORAL | Status: DC

## 2014-01-30 NOTE — Progress Notes (Signed)
Occupational Therapy Treatment Patient Details Name: Christopher Dalton MRN: [id number] DOB: November 04, 1940 Today's Date: 01/30/2014    History of present illness 73 yo male s/p laminectomy, microdiscectomy L3-L4 7/22.    OT comments  Pt slowly progressing.  Fatiques easily.  Painful back with mobility  Follow Up Recommendations  Home health OT    Equipment Recommendations  3 in 1 bedside comode    Recommendations for Other Services      Precautions / Restrictions Precautions Precautions: Back;Fall Restrictions Weight Bearing Restrictions: No       Mobility Bed Mobility           Sit to supine: Mod assist   General bed mobility comments: assist for bil LEs  Transfers       Sit to Stand: Min assist         General transfer comment: cues for hand placement and back precautions    Balance                                   ADL Overall ADL's : Needs assistance/impaired             Lower Body Bathing: Minimal assistance;With adaptive equipment;Sit to/from stand           Toilet Transfer: Minimal assistance;BSC;Stand-pivot   Toileting- Clothing Manipulation and Hygiene: Minimal assistance;Sit to/from stand         General ADL Comments: reviewed precautions.  Pt was on 3:1 commode next to bed when OT arrived.  Worked on bathing then pt requested back to bed for rest.  will sit up later  Pt agreeable to 3:1 for home.      Vision                     Perception     Praxis      Cognition   Behavior During Therapy: [facility] for tasks assessed/performed Overall Cognitive Status: Within Functional Limits for tasks assessed                       Extremity/Trunk Assessment               Exercises     Shoulder Instructions       General Comments      Pertinent Vitals/ Pain       3/10 sitting; 7-8 with movement.  Repositioned back in bed  Home Living                                           Prior Functioning/Environment              Frequency Min 2X/week     Progress Toward Goals  OT Goals(current goals can now be found in the care plan section)  Progress towards OT goals: Progressing toward goals     Plan      Co-evaluation                 End of Session     Activity Tolerance Patient limited by fatigue   Patient Left in bed;with call bell/phone within reach   Nurse Communication          Time: [phone] OT Time Calculation (min): 19 min  Charges: OT General Charges $OT Visit: 1 Procedure OT Treatments $  Self Care/Home Management : 8-22 mins  Dellamae Rosamilia 01/30/2014, 9:26 AM Marica OtterMaryellen Swannie Milius, OTR/L [phone] 01/30/2014

## 2014-01-30 NOTE — Progress Notes (Signed)
Physical Therapy Treatment Patient Details Name: Christopher Dalton MRN: [id number] DOB: 12-Feb-1941 Today's Date: 01/30/2014    History of Present Illness 73 yo male s/p laminectomy, microdiscectomy L3-L4 7/22.     PT Comments    POD # 2 assisted pt OOB using Log Roll tech with increased time and 75% VC's on proper tech.  Assisted with amb in hallway increased distance.  Pt still c/o pain traveling down L leg to groin and knee.  Assisted back to his room and back to bed on his right side positioned with pillows and applied ICE to incision(back) and L hip area.   Pt required increased time to transition positions and c/o 7/10 pain despite pre medicated.  Pt progressing slowly.   Follow Up Recommendations  Home health PT;Supervision/Assistance - 24 hour     Equipment Recommendations  Rolling walker with 5" wheels    Recommendations for Other Services       Precautions / Restrictions Precautions Precautions: Back;Fall Restrictions Weight Bearing Restrictions: No    Mobility  Bed Mobility Overal bed mobility: Needs Assistance Bed Mobility: Supine to Sit;Sit to Supine           General bed mobility comments: 75% VC's on proper log roll tech and increased time.  Assited OOB and back to bed side lying on his R.  Transfers Overall transfer level: Needs assistance Equipment used: Rolling walker (2 wheeled) Transfers: Sit to/from Stand Sit to Stand: Min guard;Min assist         General transfer comment: cues for hand placement and back precautions  Ambulation/Gait   Ambulation Distance (Feet): 125 Feet Assistive device: Rolling walker (2 wheeled) Gait Pattern/deviations: Step-to pattern;Step-through pattern;Decreased stride length Gait velocity: decreased   General Gait Details: VC safety, posture, distance from walker. Distance lilmited by pain. Pt reported pain in back and L hip/LE.    Stairs            Wheelchair Mobility    Modified Rankin (Stroke Patients  Only)       Balance                                    Cognition                            Exercises      General Comments        Pertinent Vitals/Pain C/o 7/10 back pain which also travels down L hip/thigh/groin despite pre medicated Repositioned Applied ICE     Home Living                      Prior Function            PT Goals (current goals can now be found in the care plan section) Progress towards PT goals: Progressing toward goals    Frequency  Min 5X/week    PT Plan      Co-evaluation             End of Session Equipment Utilized During Treatment: Gait belt Activity Tolerance: Patient limited by fatigue;Patient limited by pain Patient left: in bed;with call bell/phone within reach     Time: 0924-0950 PT Time Calculation (min): 26 min  Charges:  $Gait Training: 8-22 mins $Therapeutic Activity: 8-22 mins  G Codes:      Rica Koyanagi  PTA [facility]  Acute  Rehab Pager      [phone]

## 2014-01-30 NOTE — Progress Notes (Signed)
Subjective: 2 Days Post-Op Procedure(s) (LRB): Complete DECOMPRESSION lumbar laminectomy for spinal stenosis,  MICRODISCECTOMY  L3-4 ON LEFT for foraminotomy for L3-L4 nerve root (Left) Patient reports pain as 3 on 0-10 scale.Very difficult to motivate him to ambulate. A foley had to be re-inserted because of inability to ambulate. He has a great deal of difficulty to ambulate.Will have PT get him up and try to ambulate again.    Objective: Vital signs in last 24 hours: Temp:  [98.2 F (36.8 C)-101.1 F (38.4 C)] 100 F (37.8 C) (07/24 0539) Pulse Rate:  [70-114] 101 (07/24 0539) Resp:  [18] 18 (07/24 0539) BP: (120-170)/(66-73) 170/67 mmHg (07/24 0539) SpO2:  [94 %-99 %] 94 % (07/24 0539)  Intake/Output from previous day: 07/23 0701 - 07/24 0700 In: 3131.7 [P.O.:960; I.V.:2121.7; IV Piggyback:50] Out: 2850 [Urine:2850] Intake/Output this shift: Total I/O In: 2041.7 [P.O.:720; I.V.:1321.7] Out: 2400 [Urine:2400]  No results found for this basename: HGB,  in the last 72 hours No results found for this basename: WBC, RBC, HCT, PLT,  in the last 72 hours No results found for this basename: NA, K, CL, CO2, BUN, CREATININE, GLUCOSE, CALCIUM,  in the last 72 hours No results found for this basename: LABPT, INR,  in the last 72 hours  Dorsiflexion/Plantar flexion intact  Assessment/Plan: 2 Days Post-Op Procedure(s) (LRB): Complete DECOMPRESSION lumbar laminectomy for spinal stenosis,  MICRODISCECTOMY  L3-4 ON LEFT for foraminotomy for L3-L4 nerve root (Left) Up with therapy and DC Foley .  Christopher Dalton A 01/30/2014, 6:54 AM

## 2014-01-31 DIAGNOSIS — M5126 Other intervertebral disc displacement, lumbar region: Secondary | ICD-10-CM | POA: Diagnosis not present

## 2014-01-31 MED ORDER — TAMSULOSIN HCL 0.4 MG PO CAPS: 0.4000 mg | ORAL_CAPSULE | Freq: Every day | ORAL | Status: DC

## 2014-01-31 MED ORDER — HYDROCODONE-ACETAMINOPHEN 7.5-325 MG PO TABS: 1.0000 | ORAL_TABLET | ORAL | Status: DC | PRN

## 2014-01-31 MED ORDER — ONDANSETRON HCL 4 MG/2ML IJ SOLN
4.0000 mg | INTRAMUSCULAR | Status: DC | PRN
Start: 2014-01-31 — End: 2014-01-31

## 2014-01-31 MED ORDER — DOCUSATE SODIUM 100 MG PO CAPS: 100.0000 mg | ORAL_CAPSULE | Freq: Two times a day (BID) | ORAL | Status: AC | PRN

## 2014-01-31 NOTE — Progress Notes (Signed)
CARE MANAGEMENT NOTE 01/31/2014  Patient:  Christopher Dalton,Christopher Dalton   Account Number:  [id number]  Date Initiated:  01/29/2014  Documentation initiated by:  [facility] HospitalMAHAB[facility],KATHY  Subjective/Objective Assessment:   73 Y/O M ADMITTED W/SPINAL STENOSIS.     Action/Plan:   FROM HOME.   Anticipated DC Date:  01/31/2014   Anticipated DC Plan:  HOME W HOME HEALTH SERVICES      DC Planning Services  CM consult      [facility]  [facility]   Choice offered to / List presented to:  C-3 Spouse        HH arranged  HH-2 PT  HH-3 OT      [facility]   Status of service:  Completed, signed off Medicare Important Message given?  YES (If response is "NO", the following Medicare IM given date fields will be blank) Date Medicare IM given:  01/31/2014 Medicare IM given by:  [facility]AV[facility],Morell Mears Date Additional Medicare IM given:   Additional Medicare IM given by:    Discharge Disposition:  HOME W HOME HEALTH SERVICES  Per UR Regulation:  Reviewed for med. necessity/level of care/duration of stay  If discussed at Long Length of Stay Meetings, dates discussed:    Comments:  01/31/2014 1145 NCM notified [facility] of scheduled dc today. Isidoro DonningAlesia Rutilio Yellowhair RN CCM Case Mgmt phone [phone]  01/30/14 KATHY MAHABIR RN,BSN NCM [phone] [facility] CHOSEN FOR HHC.TC KRISTEN REP AWARE OF REFERRAL,& PATIENT'S REQUEST OF KNOWING HIS CO PAY-INFORMED KRISTEN SHE WILL TELL PATIENT.HHPT RECOMMENDED.PATIENT ALREADY HAS RW,WILL GET 3N1 ON OWN.AWAIT HHPT ORDER.  01/29/14 KATHY MAHABIR RN,BSN NCM [phone] PROVIDED PATIENT W/HHC AGENCY LIST,STATES HE HAS A RW,& WILL GET 3N1 ON OWN. POD#1 LAMINECTOMY,MICRODISCECTOMY.PT-HH,RW,OT-24/7 SUPV,3N1.

## 2014-01-31 NOTE — Discharge Summary (Signed)
Physician Discharge Summary   Patient ID: Christopher Dalton MRN: [id number] DOB/AGE: [birth year] 73 y.o.  Admit date: 01/28/2014 Discharge date: 01/31/2014  Primary Diagnosis:   LUMBER SPINAL STENOSIS  Admission Diagnoses:  Past Medical History  Diagnosis Date  . IMPAIRED GLUCOSE TOLERANCE 03/02/2008  . DEPRESSION 11/08/2009  . HYPERTENSION 01/21/2007  . C A D 11/15/2009  . HYPOTENSION 04/13/2009  . COPD 01/21/2007  . INGUINAL HERNIA, RIGHT 03/25/2007  . OSTEOARTHRITIS 01/21/2007  . HIP PAIN, LEFT 10/29/2008  . LOW BACK PAIN 01/21/2007  . GERD (gastroesophageal reflux disease)    Discharge Diagnoses:   Active Problems:   Spinal stenosis, lumbar region, with neurogenic claudication  Procedure:  Procedure(s) (LRB): Complete DECOMPRESSION lumbar laminectomy for spinal stenosis,  MICRODISCECTOMY  L3-4 ON LEFT for foraminotomy for L3-L4 nerve root (Left)   Consults: None  HPI:  see H&P    Laboratory Data: Hospital Outpatient Visit on 01/22/2014  Component Date Value Ref Range Status  . aPTT 01/22/2014 29  24 - 37 seconds Final  . Sodium 01/22/2014 139  137 - 147 mEq/L Final  . Potassium 01/22/2014 3.7  3.7 - 5.3 mEq/L Final  . Chloride 01/22/2014 96  96 - 112 mEq/L Final  . CO2 01/22/2014 30  19 - 32 mEq/L Final  . Glucose, Bld 01/22/2014 152* 70 - 99 mg/dL Final  . BUN 88/22/0886 12  6 - 23 mg/dL Final  . Creatinine, Ser 01/22/2014 0.78  0.50 - 1.35 mg/dL Final  . Calcium 85/52/5060 9.9  8.4 - 10.5 mg/dL Final  . Total Protein 01/22/2014 7.8  6.0 - 8.3 g/dL Final  . Albumin 49/33/1991 4.4  3.5 - 5.2 g/dL Final  . AST 90/60/7791 24  0 - 37 U/L Final  . ALT 01/22/2014 24  0 - 53 U/L Final  . Alkaline Phosphatase 01/22/2014 66  39 - 117 U/L Final  . Total Bilirubin 01/22/2014 0.4  0.3 - 1.2 mg/dL Final  . GFR calc non Af Amer 01/22/2014 86* >90 mL/min Final  . GFR calc Af Amer 01/22/2014 >90  >90 mL/min Final   Comment: (NOTE)                          The eGFR has been calculated  using the CKD EPI equation.                          This calculation has not been validated in all clinical situations.                          eGFR's persistently <90 mL/min signify possible Chronic Kidney                          Disease.  . Anion gap 01/22/2014 13  5 - 15 Final  . Prothrombin Time 01/22/2014 13.2  11.6 - 15.2 seconds Final  . INR 01/22/2014 1.00  0.00 - 1.49 Final  . Color, Urine 01/22/2014 YELLOW  YELLOW Final  . APPearance 01/22/2014 CLEAR  CLEAR Final  . Specific Gravity, Urine 01/22/2014 1.022  1.005 - 1.030 Final  . pH 01/22/2014 6.0  5.0 - 8.0 Final  . Glucose, UA 01/22/2014 NEGATIVE  NEGATIVE mg/dL Final  . Hgb urine dipstick 01/22/2014 NEGATIVE  NEGATIVE Final  . Bilirubin Urine 01/22/2014 NEGATIVE  NEGATIVE Final  . Ketones, ur 01/22/2014 NEGATIVE  NEGATIVE mg/dL Final  . Protein, ur 01/22/2014 NEGATIVE  NEGATIVE mg/dL Final  . Urobilinogen, UA 01/22/2014 0.2  0.0 - 1.0 mg/dL Final  . Nitrite 01/22/2014 NEGATIVE  NEGATIVE Final  . Leukocytes, UA 01/22/2014 NEGATIVE  NEGATIVE Final   MICROSCOPIC NOT DONE ON URINES WITH NEGATIVE PROTEIN, BLOOD, LEUKOCYTES, NITRITE, OR GLUCOSE <1000 mg/dL.  . WBC 01/22/2014 14.4* 4.0 - 10.5 K/uL Final  . RBC 01/22/2014 5.10  4.22 - 5.81 MIL/uL Final  . Hemoglobin 01/22/2014 15.3  13.0 - 17.0 g/dL Final  . HCT 01/22/2014 44.9  39.0 - 52.0 % Final  . MCV 01/22/2014 88.0  78.0 - 100.0 fL Final  . MCH 01/22/2014 30.0  26.0 - 34.0 pg Final  . MCHC 01/22/2014 34.1  30.0 - 36.0 g/dL Final  . RDW 01/22/2014 13.0  11.5 - 15.5 % Final  . Platelets 01/22/2014 287  150 - 400 K/uL Final  . MRSA, PCR 01/22/2014 NEGATIVE  NEGATIVE Final  . Staphylococcus aureus 01/22/2014 POSITIVE* NEGATIVE Final   Comment:                                 The Xpert SA Assay (FDA                          approved for NASAL specimens                          in patients over 55 years of age),                          is one component of                           a comprehensive surveillance                          program.  Test performance has                          been validated by [employer] for patients greater                          than or equal to 73 year old.                          It is not intended                          to diagnose infection nor to                          guide or monitor treatment.   No results found for this basename: HGB,  in the last 72 hours No results found for this basename: WBC, RBC, HCT, PLT,  in the last 72 hours No results found for this basename: NA, K, CL, CO2, BUN, CREATININE, GLUCOSE, CALCIUM,  in the last 72 hours No results found for this basename: LABPT, INR,  in the last 72 hours  X-Rays:Dg Chest 2 View  01/22/2014   CLINICAL DATA:  Hypertension.  Preop for spine surgery.  EXAM: CHEST  2 VIEW  COMPARISON:  05/08/2007  FINDINGS: Lungs are hyperexpanded. There are changes of emphysema. Minor scarring is noted at the apices, stable. No lung consolidation or edema.  No pleural effusion or pneumothorax.  Heart, mediastinum and hila are unremarkable.  Bony thorax is intact.  IMPRESSION: 1. No acute cardiopulmonary disease. 2. Stable changes of COPD.   Electronically Signed   By: Lajean Manes M.D.   On: 01/22/2014 11:21   Dg Lumbar Spine 2-3 Views  01/22/2014   CLINICAL DATA:  Preop for lumbar spine surgery  EXAM: LUMBAR SPINE - 2-3 VIEW  COMPARISON:  CT lumbar spine of 01/12/2014  FINDINGS: The lumbar vertebrae are straightened in alignment. There is degenerative disc disease at L3-4, L4-5, and L5-S1 levels where there is loss of disc space, sclerosis, and spurring. Vacuum disc phenomena in is noted particularly at L3-4. No compression deformity is seen. The SI joints are corticated. A small right lower pole renal calculus cannot be excluded on the frontal view.  IMPRESSION: 1. Straightened alignment with degenerative disc disease at L3-4, L4-5, and L5-S1 levels. 2.  Question small right lower pole renal calculus.   Electronically Signed   By: Ivar Drape M.D.   On: 01/22/2014 10:07   Ct Lumbar Spine W Contrast  01/12/2014   CLINICAL DATA:  Lumbar spinal stenosis.  Herniated nucleus pulposis.  EXAM: LUMBAR MYELOGRAM  FLUOROSCOPY TIME:  0 min 51 seconds  PROCEDURE: After thorough discussion of risks and benefits of the procedure including bleeding, infection, injury to nerves, blood vessels, adjacent structures as well as headache and CSF leak, written and oral informed consent was obtained. Consent was obtained by Dr. Dereck Ligas. Time out form was completed.  Patient was positioned prone on the fluoroscopy table. The patient required medication with both Dilaudid and Demerol (See MAR/Nursing Notes for dose). Positioning the patient was technically difficult. The patient was unable to assume the supine position. We over finally able to accommodate the patient an an oblique position with the LEFT side up. This made the procedure technically difficult but feasible. Local anesthesia was provided with 1% lidocaine without epinephrine after prepped and draped in the usual sterile fashion. Puncture was performed at L3-L4 using a 3 1/2 inch 22-gauge spinal needle via LEFT paramedian approach. Using a single pass through the dura, the needle was placed within the thecal sac, with return of clear CSF. 15 mL of Omnipaque-180 was injected into the thecal sac, with normal opacification of the nerve roots and cauda equina consistent with free flow within the subarachnoid space.  I personally performed the lumbar puncture and administered the intrathecal contrast. I also personally supervised acquisition of the myelogram images.  TECHNIQUE: Contiguous axial images were obtained through the Lumbar spine after the intrathecal infusion of infusion. Coronal and sagittal reconstructions were obtained of the axial image sets.  COMPARISON:  None  FINDINGS: LUMBAR MYELOGRAM FINDINGS:  In the  standing upright neutral position, there is a mild dextroconvex curve with the apex at L2-L3. Vertebral body height is preserved. Asymmetric disc space collapse is present on the LEFT at L3-L4. There is disc space loss at every level. grade I retrolisthesis of L3 on L4 measures 3 mm, associated with collapse of the disc space. Grade I anterolisthesis of L1 on L2 measures between 1 mm and 2 mm.  Flexion  extension maneuvers were performed however there is very little range of motion in the lumbar spine and no pathologic instability is identified. The retrolisthesis of L3 on L4 persists unchanged between flexion, neutral and extension positions. Minimal retrolisthesis of L1 on L2 also does not change.  CT LUMBAR MYELOGRAM FINDINGS:  There is a tiny amount of subdural contrast associated with the difficult needle placement. Epidural contrast is also present that extends laterally and to the paraspinal musculature.  Segmentation: 5 lumbar type vertebral bodies. The numbering convention used for this exam termed L5-S1 as the last intervertebral disc space.  Alignment: Unchanged mild dextroconvex curve with the apex at L3. On CT scan, the spondylolisthesis appears negligible and cannot be measured.  Vertebrae: Vertebral body height preserved. No destructive osseous lesions. Scattered Schmorl's nodes.  Conus medullaris: Normal position at L1.  Paraspinal tissues: Atherosclerosis. Contrast is already being excreted from the kidneys. Bilateral SI joint degenerative disease.  Disc levels:  T12-L1: Mild disc degeneration with posterior central annular calcification. No stenosis.  L1-L2: Disc degeneration with mild loss of height. No stenosis. Anterior disc bulging.  L2-L3: Mild RIGHT facet degeneration. Disc bulging anteriorly. No stenosis.  L3-L4: Severe disc degeneration. There is a large LEFT central disc extrusion. This extends from the RIGHT central region into the LEFT foramen, best seen on sagittal images (image 31  series 402). Because of the mixed injection, the disc is outlined by subdural contrast. There appears to be a second component of the extrusion in the lateral foraminal and extra foraminal region, probably representing a sequestered fragment (image 52 series 3). There is moderate to severe central stenosis which is associated with broad-based disc bulging, facet hypertrophy and posterior ligamentum flavum redundancy. LEFT lateral recess stenosis potentially affects the descending LEFT L4 nerve. RIGHT lateral recess and RIGHT foramen appear adequately patent.  L4-L5: Severe disc degeneration. Mild LEFT and moderate RIGHT foraminal stenosis associated with facet arthrosis and endplate spurring. Mild central stenosis is multifactorial. Severe RIGHT facet arthrosis. Mild posterior ligamentum flavum redundancy. Broad-based posterior disc bulging with mild central stenosis. The lateral recesses appear adequately patent.  L5-S1: Severe disc degeneration with loss of disc height. Bridging osteophytes are present anteriorly. No ankylosis across the disc space. Mild RIGHT and moderate LEFT foraminal stenosis associated with endplate spurring. The central canal and lateral recesses are widely patent. Mild bilateral facet arthrosis.  IMPRESSION: 1. Technically successful lumbar puncture for lumbar myelogram. 2. L3-L4 large LEFT central disc extrusion extending into the LEFT neural foramen. Probable small sequestered fragment in the lateral aspect of the LEFT foramen extending to the LEFT extra foraminal region. This could also represent a peripheral nerve sheath tumor however given the nearby extrusion, disc fragment is favored. Moderate L3-L4 central stenosis and LEFT lateral recess stenosis. 3. L4-L5 RIGHT greater than LEFT foraminal stenosis. Mild central stenosis associated with severe RIGHT facet arthrosis, disc bulging and posterior ligamentum flavum redundancy. 4. L5-S1 moderate LEFT and mild RIGHT foraminal stenosis  associated with severe disc degeneration.   Electronically Signed   By: Dereck Ligas M.D.   On: 01/12/2014 15:18   Dg Spine Portable 1 View  01/28/2014   CLINICAL DATA:  L3-4 discectomy.  EXAM: PORTABLE SPINE - 1 VIEW  COMPARISON:  Intraoperative imaging earlier today. Preoperative images [id number] [id number]  FINDINGS: Posterior surgical instruments are located and directed at the L3-4 level.  IMPRESSION: Intraoperative localization of L3-4.   Electronically Signed   By: Rolm Baptise M.D.   On: 01/28/2014 10:37  Dg Spine Portable 1 View  01/28/2014   CLINICAL DATA:  Spine surgery appear  EXAM: PORTABLE SPINE - 1 VIEW  COMPARISON:  01/28/2014.  FINDINGS: Lumbar spine numbered with the lowest lumbar appearing vertebra at L5. Metallic probe is projected over the posterior elements of L3. Diffuse degenerative change. Aortoiliac atherosclerotic vascular disease.  IMPRESSION: Metallic probe projected over posterior elements of L3.   Electronically Signed   By: Marcello Moores  Register   On: 01/28/2014 10:08   Dg Spine Portable 1 View  01/28/2014   CLINICAL DATA:  Intraoperative #2 at 9:30 a.m. on January 28, 2014  EXAM: PORTABLE SPINE - 1 VIEW  COMPARISON:  Portable lateral film of earlier today at 9:07 a.m.  FINDINGS: The upper metallic instrument projects over the posterior aspect of the L3 spinous process. The lower metallic instrument projects over the superior aspect of the L4 spinous process.  IMPRESSION: The metallic instruments project over the L3 and L4 spinous processes.   Electronically Signed   By: David  Martinique   On: 01/28/2014 09:46   Dg Spine Portable 1 View  01/28/2014   CLINICAL DATA:  Intraoperative image number 1  EXAM: PORTABLE SPINE - 1 VIEW  COMPARISON:  Lumbar spine series of January 22, 2014  FINDINGS: The lumbar vertebral bodies are preserved in height. The metallic needle localization devices project over the L3 and L4 spinous processes.  IMPRESSION: The upper localization needle projects over the  inferior aspect of the L3 spinous process in the lower needle projects over the midportion of the L4 spinous process.   Electronically Signed   By: David  [place]   On: 01/28/2014 09:33   Dg Myelography Lumbar Inj Lumbosacral  01/12/2014   CLINICAL DATA:  Lumbar spinal stenosis.  Herniated nucleus pulposis.  EXAM: LUMBAR MYELOGRAM  FLUOROSCOPY TIME:  0 min 51 seconds  PROCEDURE: After thorough discussion of risks and benefits of the procedure including bleeding, infection, injury to nerves, blood vessels, adjacent structures as well as headache and CSF leak, written and oral informed consent was obtained. Consent was obtained by Dr. Dereck Ligas. Time out form was completed.  Patient was positioned prone on the fluoroscopy table. The patient required medication with both Dilaudid and Demerol (See MAR/Nursing Notes for dose). Positioning the patient was technically difficult. The patient was unable to assume the supine position. We over finally able to accommodate the patient an an oblique position with the LEFT side up. This made the procedure technically difficult but feasible. Local anesthesia was provided with 1% lidocaine without epinephrine after prepped and draped in the usual sterile fashion. Puncture was performed at L3-L4 using a 3 1/2 inch 22-gauge spinal needle via LEFT paramedian approach. Using a single pass through the dura, the needle was placed within the thecal sac, with return of clear CSF. 15 mL of Omnipaque-180 was injected into the thecal sac, with normal opacification of the nerve roots and cauda equina consistent with free flow within the subarachnoid space.  I personally performed the lumbar puncture and administered the intrathecal contrast. I also personally supervised acquisition of the myelogram images.  TECHNIQUE: Contiguous axial images were obtained through the Lumbar spine after the intrathecal infusion of infusion. Coronal and sagittal reconstructions were obtained of the axial  image sets.  COMPARISON:  None  FINDINGS: LUMBAR MYELOGRAM FINDINGS:  In the standing upright neutral position, there is a mild dextroconvex curve with the apex at L2-L3. Vertebral body height is preserved. Asymmetric disc space collapse is present on  the LEFT at L3-L4. There is disc space loss at every level. grade I retrolisthesis of L3 on L4 measures 3 mm, associated with collapse of the disc space. Grade I anterolisthesis of L1 on L2 measures between 1 mm and 2 mm.  Flexion extension maneuvers were performed however there is very little range of motion in the lumbar spine and no pathologic instability is identified. The retrolisthesis of L3 on L4 persists unchanged between flexion, neutral and extension positions. Minimal retrolisthesis of L1 on L2 also does not change.  CT LUMBAR MYELOGRAM FINDINGS:  There is a tiny amount of subdural contrast associated with the difficult needle placement. Epidural contrast is also present that extends laterally and to the paraspinal musculature.  Segmentation: 5 lumbar type vertebral bodies. The numbering convention used for this exam termed L5-S1 as the last intervertebral disc space.  Alignment: Unchanged mild dextroconvex curve with the apex at L3. On CT scan, the spondylolisthesis appears negligible and cannot be measured.  Vertebrae: Vertebral body height preserved. No destructive osseous lesions. Scattered Schmorl's nodes.  Conus medullaris: Normal position at L1.  Paraspinal tissues: Atherosclerosis. Contrast is already being excreted from the kidneys. Bilateral SI joint degenerative disease.  Disc levels:  T12-L1: Mild disc degeneration with posterior central annular calcification. No stenosis.  L1-L2: Disc degeneration with mild loss of height. No stenosis. Anterior disc bulging.  L2-L3: Mild RIGHT facet degeneration. Disc bulging anteriorly. No stenosis.  L3-L4: Severe disc degeneration. There is a large LEFT central disc extrusion. This extends from the RIGHT  central region into the LEFT foramen, best seen on sagittal images (image 31 series 402). Because of the mixed injection, the disc is outlined by subdural contrast. There appears to be a second component of the extrusion in the lateral foraminal and extra foraminal region, probably representing a sequestered fragment (image 52 series 3). There is moderate to severe central stenosis which is associated with broad-based disc bulging, facet hypertrophy and posterior ligamentum flavum redundancy. LEFT lateral recess stenosis potentially affects the descending LEFT L4 nerve. RIGHT lateral recess and RIGHT foramen appear adequately patent.  L4-L5: Severe disc degeneration. Mild LEFT and moderate RIGHT foraminal stenosis associated with facet arthrosis and endplate spurring. Mild central stenosis is multifactorial. Severe RIGHT facet arthrosis. Mild posterior ligamentum flavum redundancy. Broad-based posterior disc bulging with mild central stenosis. The lateral recesses appear adequately patent.  L5-S1: Severe disc degeneration with loss of disc height. Bridging osteophytes are present anteriorly. No ankylosis across the disc space. Mild RIGHT and moderate LEFT foraminal stenosis associated with endplate spurring. The central canal and lateral recesses are widely patent. Mild bilateral facet arthrosis.  IMPRESSION: 1. Technically successful lumbar puncture for lumbar myelogram. 2. L3-L4 large LEFT central disc extrusion extending into the LEFT neural foramen. Probable small sequestered fragment in the lateral aspect of the LEFT foramen extending to the LEFT extra foraminal region. This could also represent a peripheral nerve sheath tumor however given the nearby extrusion, disc fragment is favored. Moderate L3-L4 central stenosis and LEFT lateral recess stenosis. 3. L4-L5 RIGHT greater than LEFT foraminal stenosis. Mild central stenosis associated with severe RIGHT facet arthrosis, disc bulging and posterior ligamentum  flavum redundancy. 4. L5-S1 moderate LEFT and mild RIGHT foraminal stenosis associated with severe disc degeneration.   Electronically Signed   By: Dereck Ligas M.D.   On: 01/12/2014 15:18    EKG: Orders placed during the hospital encounter of 01/22/14  . EKG 12-LEAD  . EKG 12-LEAD     Hospital  Course: Patient was admitted to [facility] and taken to the OR and underwent the above state procedure without complications.  Patient tolerated the procedure well and was later transferred to the recovery room and then to the orthopaedic floor for postoperative care.  They were given PO and IV analgesics for pain control following their surgery.  They were given 24 hours of postoperative antibiotics.   PT was consulted postop to assist with mobility and transfers.  The patient was allowed to be WBAT with therapy and was taught back precautions. Discharge planning was consulted to help with postop disposition and equipment needs.  Patient had a fair night on the evening of surgery and started to get up OOB with therapy on day one. Patient was seen in rounds daily. He did have urinary retention and was started on flomax. He progressed slowly with PT but on POD #3 he was safe for D/C home with HHPT.  They were given discharge instructions and dressing directions.  They were instructed on when to follow up in the office with Dr. Darrelyn Hillock.   Diet: low sodium heart healthy Activity:WBAT; Lspine precautions Follow-up:in 10-14 days Disposition - Home with HHPT Discharged Condition: good   Discharge Instructions   Call MD / Call 911    Complete by:  As directed   If you experience chest pain or shortness of breath, CALL 911 and be transported to the hospital emergency room.  If you develope a fever above 101 F, pus (white drainage) or increased drainage or redness at the wound, or calf pain, call your surgeon's office.     Constipation Prevention    Complete by:  As directed   Drink plenty of  fluids.  Prune juice may be helpful.  You may use a stool softener, such as Colace (over the counter) 100 mg twice a day.  Use MiraLax (over the counter) for constipation as needed.     Diet - low sodium heart healthy    Complete by:  As directed      Increase activity slowly as tolerated    Complete by:  As directed             Medication List    STOP taking these medications       aspirin 81 MG tablet     naproxen sodium 220 MG tablet  Commonly known as:  ANAPROX      TAKE these medications       benazepril-hydrochlorthiazide 20-12.5 MG per tablet  Commonly known as:  LOTENSIN HCT  Take 1 tablet by mouth every morning.     benazepril-hydrochlorthiazide 20-12.5 MG per tablet  Commonly known as:  LOTENSIN HCT  TAKE 1 TABLET BY MOUTH DAILY.     CRANBERRY PO  Take 1 tablet by mouth daily.     docusate sodium 100 MG capsule  Commonly known as:  COLACE  Take 1 capsule (100 mg total) by mouth 2 (two) times daily as needed for mild constipation.     HYDROcodone-acetaminophen 7.5-325 MG per tablet  Commonly known as:  NORCO  Take 1 tablet by mouth every 4 (four) hours as needed for moderate pain.     loratadine 10 MG tablet  Commonly known as:  CLARITIN  Take 10 mg by mouth daily.     multivitamin with minerals Tabs tablet  Take 1 tablet by mouth daily.     predniSONE 5 MG tablet  Commonly known as:  DELTASONE  Take 5 mg by mouth  daily with breakfast.     predniSONE 5 MG tablet  Commonly known as:  DELTASONE  TAKE 1 TABLET BY MOUTH DAILY.     tamsulosin 0.4 MG Caps capsule  Commonly known as:  FLOMAX  Take 1 capsule (0.4 mg total) by mouth daily.           Follow-up Information   Follow up with [facility])    Contact information:   [address] [place] [address] [phone]       Follow up with GIOFFRE,RONALD A, MD In 2 weeks. (For suture removal)    Specialty:   Orthopedic Surgery   Contact information:   [address] [place] [address] [phone]       Signed: Lacie Draft, PA-C Orthopaedic Surgery 01/31/2014, 11:48 AM

## 2014-01-31 NOTE — Discharge Instructions (Signed)
No lifting, bending, or prolonged sitting Keep incision clean and dry Follow up with Dr. Darrelyn HillockGioffre in 2 weeks

## 2014-01-31 NOTE — Progress Notes (Signed)
Subjective: 3 Days Post-Op Procedure(s) (LRB): Complete DECOMPRESSION lumbar laminectomy for spinal stenosis,  MICRODISCECTOMY  L3-4 ON LEFT for foraminotomy for L3-L4 nerve root (Left) Patient reports pain as 3 on 0-10 scale.    Objective: Vital signs in last 24 hours: Temp:  [99.2 F (37.3 C)-100.9 F (38.3 C)] 99.2 F (37.3 C) (07/25 0530) Pulse Rate:  [94-110] 99 (07/25 0530) Resp:  [14-18] 18 (07/25 0530) BP: (135-153)/(72-81) 138/81 mmHg (07/25 0530) SpO2:  [95 %-96 %] 95 % (07/25 0530)  Intake/Output from previous day: 07/24 0701 - 07/25 0700 In: 686.7 [P.O.:480; I.V.:206.7] Out: 901 [Urine:901] Intake/Output this shift:    No results found for this basename: HGB,  in the last 72 hours No results found for this basename: WBC, RBC, HCT, PLT,  in the last 72 hours No results found for this basename: NA, K, CL, CO2, BUN, CREATININE, GLUCOSE, CALCIUM,  in the last 72 hours No results found for this basename: LABPT, INR,  in the last 72 hours  Neurologically intact No DVT. Dsg dry  Assessment/Plan: 3 Days Post-Op Procedure(s) (LRB): Complete DECOMPRESSION lumbar laminectomy for spinal stenosis,  MICRODISCECTOMY  L3-4 ON LEFT for foraminotomy for L3-L4 nerve root (Left) Discharge home with home health F/U 2 weeks  Instructions given  Loomis Anacker C 01/31/2014, 7:53 AM

## 2014-01-31 NOTE — Progress Notes (Signed)
Discharge instructions and prescriptions  reviewed with patient and wife utilizing teach back method.  Patient being discharged to home. 

## 2014-01-31 NOTE — Progress Notes (Signed)
Occupational Therapy Treatment Patient Details Name: Christopher Dalton MRN: [id number] DOB: 1940/10/12 Today's Date: 01/31/2014    History of present illness 73 yo male s/p laminectomy, microdiscectomy L3-L4 7/22.    OT comments  Worked on education with [facility] AE as well as reinforced back precautions for ADL tasks. Pt declined the need to practice 3in1 transfers further but noted that pt resistive to pushing up with hands on bed/surface and wants to pull up on walker. Explained safety implications of this and demonstrated proper technique for pt with toilet transfers/transitions out of chair/bed. He still states he prefers to do it his way.   Follow Up Recommendations  Home health OT;Supervision/Assistance - 24 hour    Equipment Recommendations  3 in 1 bedside comode    Recommendations for Other Services      Precautions / Restrictions Precautions Precautions: Back;Fall Precaution Booklet Issued: Yes (comment) Precaution Comments: issued back care handout and reviewed all precautions. Pt was not able to state any precautions on his own other than "to keep back straight." Restrictions Weight Bearing Restrictions: No       Mobility Bed Mobility Overal bed mobility: Needs Assistance Bed Mobility: Rolling;Sidelying to Sit Rolling: Min guard Sidelying to sit: Min guard Supine to sit: Min guard Sit to supine: Min guard   General bed mobility comments: did well with log roll technique. guard for safety due to pain level.   Transfers Overall transfer level: Needs assistance Equipment used: Rolling walker (2 wheeled) Transfers: Sit to/from Stand Sit to Stand: Min guard         General transfer comment: Pt resistive to pushing up with hands on the bed with OT also.    Balance                                   ADL                       Lower Body Dressing: Minimal assistance;Sit to/from stand;Adhering to back precautions;With adaptive equipment        Toileting- ArchitectClothing Manipulation and Hygiene: Min guard;Sit to/from stand         General ADL Comments: Pt states he has been up to 3in1 several times and declines the need to practice again. Reviewed sequence for sit to stand and stand to sit transitions and safe walker use. Pt pushes up with the walker rather than pushing wtih his hands on the bed despite repeated recommendation to not pull up on walker. Reviewed need to reach back and perform periarea hygiene without bending forward and twisting and pt able to return demonstrate periarea cleaning while adhering to precautions.  Pt didnt have a back care handout in room so issued back care handout and reinforced all precautions with pt. He was not able to state any of the precautions on his own only to "keep the back straight." Pt resistant to pushing up with hands on bed and when asked about this, pt states "I want to do it my way." Explained potential for walker to roll out from under him. Pt donned shirt with reinforcement of how to do so with back precautions. He states family can help with donning socks and washing feet and he declines LHS and sock aid at this time. He does have a reacher so practiced with donning pants. Min assist only to help pants over gripper socks due to socks sticking  to pants.       Vision                     Perception     Praxis      Cognition   Behavior During Therapy: [facility] for tasks assessed/performed Overall Cognitive Status: Within Functional Limits for tasks assessed                       Extremity/Trunk Assessment               Exercises     Shoulder Instructions       General Comments      Pertinent Vitals/ Pain       4/10 at rest; 12/10 with activity; premedicated; reposition.   Home Living                                          Prior Functioning/Environment              Frequency Min 2X/week     Progress Toward Goals  OT  Goals(current goals can now be found in the care plan section)  Progress towards OT goals: Progressing toward goals  Acute Rehab OT Goals Patient Stated Goal: less pain.   Plan Discharge plan remains appropriate    Co-evaluation                 End of Session     Activity Tolerance Patient limited by pain   Patient Left in bed;with call bell/phone within reach   Nurse Communication          Time: [phone] OT Time Calculation (min): 29 min  Charges: OT General Charges $OT Visit: 1 Procedure OT Treatments $Self Care/Home Management : 8-22 mins $Therapeutic Activity: 8-22 mins  Lennox Laity [phone] 01/31/2014, 12:20 PM

## 2014-01-31 NOTE — Progress Notes (Signed)
Physical Therapy Treatment Patient Details Name: Christopher Dalton MRN: [id number] DOB: 1941-01-13 Today's Date: 01/31/2014    History of Present Illness 73 yo male s/p laminectomy, microdiscectomy L3-L4 7/22.     PT Comments    Pt did better with body mechanics with log rolling today and with gait.  L hip pain continues to limit his ability to ambulate further.  Education provided on stairs and back precautions.  Pt is to be d/c from hospital today and strongly recommend HHPT to encourage mobility and continue progress towards PLOF.  Follow Up Recommendations  Home health PT;Supervision/Assistance - 24 hour     Equipment Recommendations  Rolling walker with 5" wheels    Recommendations for Other Services       Precautions / Restrictions Precautions Precautions: Back;Fall Restrictions Weight Bearing Restrictions: No    Mobility  Bed Mobility   Bed Mobility: Supine to Sit     Supine to sit: Min guard Sit to supine: Min guard   General bed mobility comments: pt with good log rolling technique today without cueing.  Difficulty getting L LE up onto bed.  Transfers   Equipment used: Rolling walker (2 wheeled) Transfers: Sit to/from Stand Sit to Stand: Min guard;From elevated surface         General transfer comment: Pt resistive to PT's cueing for hand placement to push from bed.    Ambulation/Gait Ambulation/Gait assistance: Min guard Ambulation Distance (Feet): 120 Feet Assistive device: Rolling walker (2 wheeled) Gait Pattern/deviations: Step-to pattern;Antalgic;Decreased step length - left Gait velocity: decreased   General Gait Details: Pt with improved positioning within RW with antalgic gait pattern and reports of grabbing in L hip.  Several standing rest beraks due to L hip pain.   Stairs            Wheelchair Mobility    Modified Rankin (Stroke Patients Only)       Balance                                    Cognition  Arousal/Alertness: Awake/alert Behavior During Therapy: WFL for tasks assessed/performed Overall Cognitive Status: Within Functional Limits for tasks assessed                      Exercises      General Comments General comments (skin integrity, edema, etc.): Discussed with pt to hold hand of son and use cane, but also issued handout on use of RW with stairs. Educated on importance of son stabilizing RW.  Reviewed back precautions with patient and how to incorporate into movement.       Pertinent Vitals/Pain Pain minimal at rest. Severe ("15/10") with movement.  Pain meds given prior to treatment.    Home Living                      Prior Function            PT Goals (current goals can now be found in the care plan section) Acute Rehab PT Goals Patient Stated Goal: less pain.  PT Goal Formulation: With patient Time For Goal Achievement: 02/05/14 Potential to Achieve Goals: Good Progress towards PT goals: Progressing toward goals    Frequency  Min 5X/week    PT Plan Current plan remains appropriate    Co-evaluation             End of  Session Equipment Utilized During Treatment: Gait belt Activity Tolerance: Patient limited by pain Patient left: in bed;with call bell/phone within reach     Time: 0849-0912 PT Time Calculation (min): 23 min  Charges:  $Gait Training: 8-22 mins $Therapeutic Activity: 8-22 mins                    G Codes:  Functional Assessment Tool Used: clinical judgement Functional Limitation: Mobility: Walking and moving around Mobility: Walking and Moving Around Current Status [phone]): At least 1 percent but less than 20 percent impaired, limited or restricted Mobility: Walking and Moving Around Discharge Status [phone]): At least 1 percent but less than 20 percent impaired, limited or restricted   [facility] LUBECK 01/31/2014, 9:31 AM

## 2014-02-02 ENCOUNTER — Telehealth: Payer: Self-pay | Admitting: Internal Medicine

## 2014-02-02 NOTE — Telephone Encounter (Signed)
Spoke to pt, told him Dr. Kirtland BouchardK is out all week and that he should contact his surgeon being as he just had surgery and was discharged yesterday. Pt verbalized understanding. Asked pt if Hydrocodone is helping pain? Pt stated yes some. Told him okay, need to call surgeon. Pt verbalized understanding.

## 2014-02-02 NOTE — Telephone Encounter (Signed)
Patient Information:  Caller Name: Leory Plowmanlton  Phone: [phone]  Patient: Christopher Dalton, Christopher Dalton  Gender: Male  DOB: [birth year]  Age: [age] Years  PCP: Eleonore ChiquitoKwiatkowski, Peter (Family Practice > 4864yrs old)  Office Follow Up:  Does the office need to follow up with this patient?: Yes  Instructions For The Office: He is in pain and states he cannot get to the office . Using walker to ambulate.  Hydrocodone for pain currently, Prednsione 5mg   started yesterday . Last dose 1 1/2 hours ago. Pulse rate now 125. PLEASE CONTACT PATIENT  RN Note:  He is in pain and states he cannot get to the office . Offered appt. Declined. . Using walker to ambulate.  Hydrocodone for pain currently, Prednsione 5mg   started yesterday . Last dose 1 1/2 hours ago. Pulse rate now 125. PLEASE CONTACT PATIENT  Symptoms  Reason For Call & Symptoms: Patient states [facility]  from [facility] (Disc surgery).  He reports he is up and walking around. His Heart rate increased 129 yesterday after arriving home.  Today, Heart rate is 112 with rest. He feels it beating fast..   Normally Heart rate is 78-79.   He is eating and drinking normally.  No chest pain. No shortness of breath.. Denies dizziness.  Blood pressure 158/88 Pulse now 125.  Reviewed Health History In EMR: Yes  Reviewed Medications In EMR: Yes  Reviewed Allergies In EMR: Yes  Reviewed Surgeries / Procedures: Yes  Date of Onset of Symptoms: 02/01/2014  Guideline(s) Used:  Heart Rate and Heartbeat Questions  Disposition Per Guideline:   See Today in Office  Reason For Disposition Reached:   Age > 60 years  Advice Given:  Health Basics  Sleep: Try to get sufficient amount of sleep. Lack of sleep can aggravate palpitations. Most people need 7-8 hours of sleep each night.  Diet: Eat a balanced healthy diet.  Liquid Intake: Drink adequate liquids, 6-8 glasses of water daily.  Avoid Caffeine  Avoid caffeine-containing beverages  (Reason: caffeine is a stimulant and can aggravate palpitations).  Examples of caffeine-containing beverages include coffee, tea, colas, [address]Mountain Dew, Bed Bath & Beyonded Bull, and some energy drinks.  Call Back If:  Chest pain, lightheadedness, or difficulty breathing occurs  Heart beating more than 130 beats / minute  You become worse.  RN Overrode Recommendation:  Document Patient  He is in pain and states he cannot get to the office . Using walker to ambulate.  Hydrocodone for pain currently, Prednsione 5mg   started yesterday . Last dose 1 1/2 hours ago. Pulse rate now 125. PLEASE CONTACT PATIENT

## 2014-02-14 ENCOUNTER — Other Ambulatory Visit: Payer: Self-pay | Admitting: Internal Medicine

## 2014-02-16 NOTE — Telephone Encounter (Signed)
Okay to refill Valium 5 mg?

## 2014-02-17 NOTE — Telephone Encounter (Signed)
ok 

## 2014-02-17 NOTE — Telephone Encounter (Signed)
Rx called in to pharmacy. 

## 2014-02-23 ENCOUNTER — Ambulatory Visit (INDEPENDENT_AMBULATORY_CARE_PROVIDER_SITE_OTHER): Payer: Medicare Other | Admitting: Internal Medicine

## 2014-02-23 ENCOUNTER — Encounter: Payer: Self-pay | Admitting: Internal Medicine

## 2014-02-23 ENCOUNTER — Other Ambulatory Visit: Payer: Self-pay | Admitting: *Deleted

## 2014-02-23 VITALS — BP 130/70 | HR 98 | Temp 97.5°F | Resp 20 | Ht 74.25 in | Wt 188.0 lb

## 2014-02-23 DIAGNOSIS — E119 Type 2 diabetes mellitus without complications: Secondary | ICD-10-CM

## 2014-02-23 DIAGNOSIS — E785 Hyperlipidemia, unspecified: Secondary | ICD-10-CM

## 2014-02-23 DIAGNOSIS — Z0000 Encounter for general adult medical examination without abnormal findings: Secondary | ICD-10-CM

## 2014-02-23 DIAGNOSIS — Z136 Encounter for screening for cardiovascular disorders: Secondary | ICD-10-CM

## 2014-02-23 DIAGNOSIS — M1990 Unspecified osteoarthritis, unspecified site: Secondary | ICD-10-CM

## 2014-02-23 DIAGNOSIS — M48062 Spinal stenosis, lumbar region with neurogenic claudication: Secondary | ICD-10-CM

## 2014-02-23 DIAGNOSIS — M545 Low back pain, unspecified: Secondary | ICD-10-CM

## 2014-02-23 DIAGNOSIS — J4489 Other specified chronic obstructive pulmonary disease: Secondary | ICD-10-CM

## 2014-02-23 DIAGNOSIS — E739 Lactose intolerance, unspecified: Secondary | ICD-10-CM

## 2014-02-23 DIAGNOSIS — I10 Essential (primary) hypertension: Secondary | ICD-10-CM

## 2014-02-23 DIAGNOSIS — Z23 Encounter for immunization: Secondary | ICD-10-CM

## 2014-02-23 DIAGNOSIS — I2510 Atherosclerotic heart disease of native coronary artery without angina pectoris: Secondary | ICD-10-CM

## 2014-02-23 DIAGNOSIS — J449 Chronic obstructive pulmonary disease, unspecified: Secondary | ICD-10-CM

## 2014-02-23 DIAGNOSIS — E039 Hypothyroidism, unspecified: Secondary | ICD-10-CM

## 2014-02-23 LAB — LIPID PANEL
Cholesterol: 206 mg/dL — ABNORMAL HIGH (ref 0–200)
HDL: 57.8 mg/dL (ref >39.00)
LDL Cholesterol: 114 mg/dL — ABNORMAL HIGH (ref 0–99)
NONHDL: 148.2
Total CHOL/HDL Ratio: 4
Triglycerides: 173 mg/dL — ABNORMAL HIGH (ref 0.0–149.0)
VLDL: 34.6 mg/dL (ref 0.0–40.0)

## 2014-02-23 LAB — HEMOGLOBIN A1C: Hgb A1c MFr Bld: 7.7 % — ABNORMAL HIGH (ref 4.6–6.5)

## 2014-02-23 LAB — TSH: TSH: 0.33 u[IU]/mL — AB (ref 0.35–4.50)

## 2014-02-23 MED ORDER — HYDROCODONE-ACETAMINOPHEN 7.5-325 MG PO TABS
1.0000 | ORAL_TABLET | ORAL | Status: DC | PRN
Start: 2014-02-23 — End: 2014-03-23

## 2014-02-23 MED ORDER — METFORMIN HCL 500 MG PO TABS: 500.0000 mg | ORAL_TABLET | Freq: Two times a day (BID) | ORAL | Status: DC

## 2014-02-23 NOTE — Patient Instructions (Signed)
Health Maintenance A healthy lifestyle and preventative care can promote health and wellness.  Maintain regular health, dental, and eye exams.  Eat a healthy diet. Foods like vegetables, fruits, whole grains, low-fat dairy products, and lean protein foods contain the nutrients you need and are low in calories. Decrease your intake of foods high in solid fats, added sugars, and salt. Get information about a proper diet from your health care provider, if necessary.  Regular physical exercise is one of the most important things you can do for your health. Most adults should get at least 150 minutes of moderate-intensity exercise (any activity that increases your heart rate and causes you to sweat) each week. In addition, most adults need muscle-strengthening exercises on 2 or more days a week.   Maintain a healthy weight. The body mass index (BMI) is a screening tool to identify possible weight problems. It provides an estimate of body fat based on height and weight. Your health care provider can find your BMI and can help you achieve or maintain a healthy weight. For males 20 years and older:  A BMI below 18.5 is considered underweight.  A BMI of 18.5 to 24.9 is normal.  A BMI of 25 to 29.9 is considered overweight.  A BMI of 30 and above is considered obese.  Maintain normal blood lipids and cholesterol by exercising and minimizing your intake of saturated fat. Eat a balanced diet with plenty of fruits and vegetables. Blood tests for lipids and cholesterol should begin at age 20 and be repeated every 5 years. If your lipid or cholesterol levels are high, you are over age 50, or you are at high risk for heart disease, you may need your cholesterol levels checked more frequently.Ongoing high lipid and cholesterol levels should be treated with medicines if diet and exercise are not working.  If you smoke, find out from your health care provider how to quit. If you do not use tobacco, do not  start.  Lung cancer screening is recommended for adults aged 55-80 years who are at high risk for developing lung cancer because of a history of smoking. A yearly low-dose CT scan of the lungs is recommended for people who have at least a 30-pack-year history of smoking and are current smokers or have quit within the past 15 years. A pack year of smoking is smoking an average of 1 pack of cigarettes a day for 1 year (for example, a 30-pack-year history of smoking could mean smoking 1 pack a day for 30 years or 2 packs a day for 15 years). Yearly screening should continue until the smoker has stopped smoking for at least 15 years. Yearly screening should be stopped for people who develop a health problem that would prevent them from having lung cancer treatment.  If you choose to drink alcohol, do not have more than 2 drinks per day. One drink is considered to be 12 oz (360 mL) of beer, 5 oz (150 mL) of wine, or 1.5 oz (45 mL) of liquor.  Avoid the use of street drugs. Do not share needles with anyone. Ask for help if you need support or instructions about stopping the use of drugs.  High blood pressure causes heart disease and increases the risk of stroke. Blood pressure should be checked at least every 1-2 years. Ongoing high blood pressure should be treated with medicines if weight loss and exercise are not effective.  If you are 45-79 years old, ask your health care provider if   you should take aspirin to prevent heart disease.  Diabetes screening involves taking a blood sample to check your fasting blood sugar level. This should be done once every 3 years after age 45 if you are at a normal weight and without risk factors for diabetes. Testing should be considered at a younger age or be carried out more frequently if you are overweight and have at least 1 risk factor for diabetes.  Colorectal cancer can be detected and often prevented. Most routine colorectal cancer screening begins at the age of 50  and continues through age 75. However, your health care provider may recommend screening at an earlier age if you have risk factors for colon cancer. On a yearly basis, your health care provider may provide home test kits to check for hidden blood in the stool. A small camera at the end of a tube may be used to directly examine the colon (sigmoidoscopy or colonoscopy) to detect the earliest forms of colorectal cancer. Talk to your health care provider about this at age 50 when routine screening begins. A direct exam of the colon should be repeated every 5-10 years through age 75, unless early forms of precancerous polyps or small growths are found.  People who are at an increased risk for hepatitis B should be screened for this virus. You are considered at high risk for hepatitis B if:  You were born in a country where hepatitis B occurs often. Talk with your health care provider about which countries are considered high risk.  Your parents were born in a high-risk country and you have not received a shot to protect against hepatitis B (hepatitis B vaccine).  You have HIV or AIDS.  You use needles to inject street drugs.  You live with, or have sex with, someone who has hepatitis B.  You are a man who has sex with other men (MSM).  You get hemodialysis treatment.  You take certain medicines for conditions like cancer, organ transplantation, and autoimmune conditions.  Hepatitis C blood testing is recommended for all people born from 1945 through 1965 and any individual with known risk factors for hepatitis C.  Healthy men should no longer receive prostate-specific antigen (PSA) blood tests as part of routine cancer screening. Talk to your health care provider about prostate cancer screening.  Testicular cancer screening is not recommended for adolescents or adult males who have no symptoms. Screening includes self-exam, a health care provider exam, and other screening tests. Consult with your  health care provider about any symptoms you have or any concerns you have about testicular cancer.  Practice safe sex. Use condoms and avoid high-risk sexual practices to reduce the spread of sexually transmitted infections (STIs).  You should be screened for STIs, including gonorrhea and chlamydia if:  You are sexually active and are younger than 24 years.  You are older than 24 years, and your health care provider tells you that you are at risk for this type of infection.  Your sexual activity has changed since you were last screened, and you are at an increased risk for chlamydia or gonorrhea. Ask your health care provider if you are at risk.  If you are at risk of being infected with HIV, it is recommended that you take a prescription medicine daily to prevent HIV infection. This is called pre-exposure prophylaxis (PrEP). You are considered at risk if:  You are a man who has sex with other men (MSM).  You are a heterosexual man who   is sexually active with multiple partners.  You take drugs by injection.  You are sexually active with a partner who has HIV.  Talk with your health care provider about whether you are at high risk of being infected with HIV. If you choose to begin PrEP, you should first be tested for HIV. You should then be tested every 3 months for as long as you are taking PrEP.  Use sunscreen. Apply sunscreen liberally and repeatedly throughout the day. You should seek shade when your shadow is shorter than you. Protect yourself by wearing long sleeves, pants, a wide-brimmed hat, and sunglasses year round whenever you are outdoors.  Tell your health care provider of new moles or changes in moles, especially if there is a change in shape or color. Also, tell your health care provider if a mole is larger than the size of a pencil eraser.  A one-time screening for abdominal aortic aneurysm (AAA) and surgical repair of large AAAs by ultrasound is recommended for men aged  65-75 years who are current or former smokers.  Stay current with your vaccines (immunizations). Document Released: 12/23/2007 Document Revised: 07/01/2013 Document Reviewed: 11/21/2010 Exit[employer] Patient Information 2015 [employer]. This information is not intended to replace advice given to you by your health care provider. Make sure you discuss any questions you have with your health care provider. Cardiac Diet This diet can help prevent heart disease and stroke. Many factors influence your heart health, including eating and exercise habits. Coronary risk rises a lot with abnormal blood fat (lipid) levels. Cardiac meal planning includes limiting unhealthy fats, increasing healthy fats, and making other small dietary changes. General guidelines are as follows:  Adjust calorie intake to reach and maintain desirable body weight.  Limit total fat intake to less than 30% of total calories. Saturated fat should be less than 7% of calories.  Saturated fats are found in animal products and in some vegetable products. Saturated vegetable fats are found in coconut oil, cocoa butter, palm oil, and palm kernel oil. Read labels carefully to avoid these products as much as possible. Use butter in moderation. Choose tub margarines and oils that have 2 grams of fat or less. Good cooking oils are canola and olive oils.  Practice low-fat cooking techniques. Do not fry food. Instead, broil, bake, boil, steam, grill, roast on a rack, stir-fry, or microwave it. Other fat reducing suggestions include:  Remove the skin from poultry.  Remove all visible fat from meats.  Skim the fat off stews, soups, and gravies before serving them.  Steam vegetables in water or broth instead of sauting them in fat.  Avoid foods with trans fat (or hydrogenated oils), such as commercially fried foods and commercially baked goods. Commercial shortening and deep-frying fats will contain trans fat.  Increase intake of fruits,  vegetables, whole grains, and legumes to replace foods high in fat.  Increase consumption of nuts, legumes, and seeds to at least 4 servings weekly. One serving of a legume equals  cup, and 1 serving of nuts or seeds equals  cup.  Choose whole grains more often. Have 3 servings per day (a serving is 1 ounce [oz]).  Eat 4 to 5 servings of vegetables per day. A serving of vegetables is 1 cup of raw leafy vegetables;  cup of raw or cooked cut-up vegetables;  cup of vegetable juice.  Eat 4 to 5 servings of fruit per day. A serving of fruit is 1 medium whole fruit;  cup of   dried fruit;  cup of fresh, frozen, or canned fruit;  cup of 100% fruit juice.  Increase your intake of dietary fiber to 20 to 30 grams per day. Insoluble fiber may help lower your risk of heart disease and may help curb your appetite.  Soluble fiber binds cholesterol to be removed from the blood. Foods high in soluble fiber are dried beans, citrus fruits, oats, apples, bananas, broccoli, Brussels sprouts, and eggplant.  Try to include foods fortified with plant sterols or stanols, such as yogurt, breads, juices, or margarines. Choose several fortified foods to achieve a daily intake of 2 to 3 grams of plant sterols or stanols.  Foods with omega-3 fats can help reduce your risk of heart disease. Aim to have a 3.5 oz portion of fatty fish twice per week, such as salmon, mackerel, albacore tuna, sardines, lake trout, or herring. If you wish to take a fish oil supplement, choose one that contains 1 gram of both DHA and EPA.  Limit processed meats to 2 servings (3 oz portion) weekly.  Limit the sodium in your diet to 1500 milligrams (mg) per day. If you have high blood pressure, talk to a registered dietitian about a DASH (Dietary Approaches to Stop Hypertension) eating plan.  Limit sweets and beverages with added sugar, such as soda, to no more than 5 servings per week. One serving is:   1 tablespoon sugar.  1 tablespoon  jelly or jam.   cup sorbet.  1 cup lemonade.   cup regular soda. CHOOSING FOODS Starches  Allowed: Breads: All kinds (wheat, rye, raisin, white, oatmeal, Italian, French, and English muffin bread). Low-fat rolls: English muffins, frankfurter and hamburger buns, bagels, pita bread, tortillas (not fried). Pancakes, waffles, biscuits, and muffins made with recommended oil.  Avoid: Products made with saturated or trans fats, oils, or whole milk products. Butter rolls, cheese breads, croissants. Commercial doughnuts, muffins, sweet rolls, biscuits, waffles, pancakes, store-bought mixes. Crackers  Allowed: Low-fat crackers and snacks: Animal, graham, rye, saltine (with recommended oil, no lard), oyster, and matzo crackers. Bread sticks, melba toast, rusks, flatbread, pretzels, and light popcorn.  Avoid: High-fat crackers: cheese crackers, butter crackers, and those made with coconut, palm oil, or trans fat (hydrogenated oils). Buttered popcorn. Cereals  Allowed: Hot or cold whole-grain cereals.  Avoid: Cereals containing coconut, hydrogenated vegetable fat, or animal fat. Potatoes / Pasta / Rice  Allowed: All kinds of potatoes, rice, and pasta (such as macaroni, spaghetti, and noodles).  Avoid: Pasta or rice prepared with cream sauce or high-fat cheese. Chow mein noodles, French fries. Vegetables  Allowed: All vegetables and vegetable juices.  Avoid: Fried vegetables. Vegetables in cream, butter, or high-fat cheese sauces. Limit coconut. Fruit in cream or custard. Protein  Allowed: Limit your intake of meat, seafood, and poultry to no more than 6 oz (cooked weight) per day. All lean, well-trimmed beef, veal, pork, and lamb. All chicken and turkey without skin. All fish and shellfish. Wild game: wild duck, rabbit, pheasant, and venison. Egg whites or low-cholesterol egg substitutes may be used as desired. Meatless dishes: recipes with dried beans, peas, lentils, and tofu (soybean curd).  Seeds and nuts: all seeds and most nuts.  Avoid: Prime grade and other heavily marbled and fatty meats, such as short ribs, spare ribs, rib eye roast or steak, frankfurters, sausage, bacon, and high-fat luncheon meats, mutton. Caviar. Commercially fried fish. Domestic duck, goose, venison sausage. Organ meats: liver, gizzard, heart, chitterlings, brains, kidney, sweetbreads. Dairy  Allowed: Low-fat cheeses: nonfat   or low-fat cottage cheese (1% or 2% fat), cheeses made with part skim milk, such as mozzarella, farmers, string, or ricotta. (Cheeses should be labeled no more than 2 to 6 grams fat per oz.). Skim (or 1%) milk: liquid, powdered, or evaporated. Buttermilk made with low-fat milk. Drinks made with skim or low-fat milk or cocoa. Chocolate milk or cocoa made with skim or low-fat (1%) milk. Nonfat or low-fat yogurt.  Avoid: Whole milk cheeses, including colby, cheddar, muenster, Monterey Jack, Havarti, Brie, Camembert, American, Swiss, and blue. Creamed cottage cheese, cream cheese. Whole milk and whole milk products, including buttermilk or yogurt made from whole milk, drinks made from whole milk. Condensed milk, evaporated whole milk, and 2% milk. Soups and Combination Foods  Allowed: Low-fat low-sodium soups: broth, dehydrated soups, homemade broth, soups with the fat removed, homemade cream soups made with skim or low-fat milk. Low-fat spaghetti, lasagna, chili, and Spanish rice if low-fat ingredients and low-fat cooking techniques are used.  Avoid: Cream soups made with whole milk, cream, or high-fat cheese. All other soups. Desserts and Sweets  Allowed: Sherbet, fruit ices, gelatins, meringues, and angel food cake. Homemade desserts with recommended fats, oils, and milk products. Jam, jelly, honey, marmalade, sugars, and syrups. Pure sugar candy, such as gum drops, hard candy, jelly beans, marshmallows, mints, and small amounts of dark chocolate.  Avoid: Commercially prepared cakes, pies,  cookies, frosting, pudding, or mixes for these products. Desserts containing whole milk products, chocolate, coconut, lard, palm oil, or palm kernel oil. Ice cream or ice cream drinks. Candy that contains chocolate, coconut, butter, hydrogenated fat, or unknown ingredients. Buttered syrups. Fats and Oils  Allowed: Vegetable oils: safflower, sunflower, corn, soybean, cottonseed, sesame, canola, olive, or peanut. Non-hydrogenated margarines. Salad dressing or mayonnaise: homemade or commercial, made with a recommended oil. Low or nonfat salad dressing or mayonnaise.  Limit added fats and oils to 6 to 8 tsp per day (includes fats used in cooking, baking, salads, and spreads on bread). Remember to count the "hidden fats" in foods.  Avoid: Solid fats and shortenings: butter, lard, salt pork, bacon drippings. Gravy containing meat fat, shortening, or suet. Cocoa butter, coconut. Coconut oil, palm oil, palm kernel oil, or hydrogenated oils: these ingredients are often used in bakery products, nondairy creamers, whipped toppings, candy, and commercially fried foods. Read labels carefully. Salad dressings made of unknown oils, sour cream, or cheese, such as blue cheese and Roquefort. Cream, all kinds: half-and-half, light, heavy, or whipping. Sour cream or cream cheese (even if "light" or low-fat). Nondairy cream substitutes: coffee creamers and sour cream substitutes made with palm, palm kernel, hydrogenated oils, or coconut oil. Beverages  Allowed: Coffee (regular or decaffeinated), tea. Diet carbonated beverages, mineral water. Alcohol: Check with your caregiver. Moderation is recommended.  Avoid: Whole milk, regular sodas, and juice drinks with added sugar. Condiments  Allowed: All seasonings and condiments. Cocoa powder. "Cream" sauces made with recommended ingredients.  Avoid: Carob powder made with hydrogenated fats. SAMPLE MENU Breakfast   cup orange juice   cup oatmeal  1 slice toast  1  tsp margarine  1 cup skim milk Lunch  Turkey sandwich with 2 oz turkey, 2 slices bread  Lettuce and tomato slices  Fresh fruit  Carrot sticks  Coffee or tea Snack  Fresh fruit or low-fat crackers Dinner  3 oz lean ground beef  1 baked potato  1 tsp margarine   cup asparagus  Lettuce salad  1 tbs non-creamy dressing   cup peach slices    1 cup skim milk Document Released: 04/04/2008 Document Revised: 12/26/2011 Document Reviewed: 08/26/2013 [employer] Patient Information 2015 [employer]. This information is not intended to replace advice given to you by your health care provider. Make sure you discuss any questions you have with your health care provider.  

## 2014-02-23 NOTE — Progress Notes (Signed)
Subjective:    Patient ID: Christopher Dalton, male    DOB: Jun 02, 1941, 73 y.o.   MRN: [id number]  HPI  Admit date: 01/28/2014  Discharge date: 01/31/2014  Primary Diagnosis:  LUMBER SPINAL STENOSIS  Admission Diagnoses:  Past Medical History   Diagnosis  Date   .  IMPAIRED GLUCOSE TOLERANCE  03/02/2008   .  DEPRESSION  11/08/2009   .  HYPERTENSION  01/21/2007   .  C A D  11/15/2009   .  HYPOTENSION  04/13/2009   .  COPD  01/21/2007   .  INGUINAL HERNIA, RIGHT  03/25/2007   .  OSTEOARTHRITIS  01/21/2007   .  HIP PAIN, LEFT  10/29/2008   .  LOW BACK PAIN  01/21/2007   .  GERD (gastroesophageal reflux disease)    Discharge Diagnoses:  Active Problems:  Spinal stenosis, lumbar region, with neurogenic claudication  Procedure:  Procedure(s) (LRB):  Complete DECOMPRESSION lumbar laminectomy for spinal stenosis, MICRODISCECTOMY L3-4 ON LEFT for foraminotomy for L3-L4 nerve root (Left)    HPI   73  year-old patient who is seen today in for a comprehensive evaluation. Medical problems include hypertension impaired glucose tolerance, COPD. He is status post laminectomy last month.  Due to lumbar spinal stenosis.  He did well with his surgery.  He states his low back pain has improved, but still having significant left hip discomfort. His pulmonary status has been stable EKG and lab studies from his recent hospitalization reviewed  Here for Medicare AWV:  1. Risk factors based on Past M, S, F history: gross factors include hypertension, and history of tobacco use.  2. Physical Activities: limited due to low back pain and left hip pain 3. Depression/mood: history depression, and insomnia for the past 6 months  4. Hearing: mild impairment with history of tinnitus  5. ADL's: totally independent  6. Fall Risk: minimal  7. Home Safety: no issues identified  8. Height, weight, &visual acuity: stable no difficulty with visual acuity  9. Counseling: exercise regimen discussed  10. Labs ordered based on  risk factors: will check a random blood sugar. Laboratory panel was checked last fall  11. Referral Coordination- declines screening colonoscopy we'll check for FOB  12. Care Plan- will place on antidepressant medication  13. Cognitive Assessment- no problems identified. accompanied by his wife who has no concerns about cognitive dysfunction   Allergies:  1) Amoxicillin (Amoxicillin)  2) Sulfamethoxazole (Sulfamethoxazole)   Past History:  Past Medical History:  COPD  Hypertension  Low back pain  Osteoarthritis  Allergic rhinitis  impaired glucose tolerance  Depression   Past Surgical History:   Tonsillectomy  Reconstructive surgery/face  status post right inguinal hernia 2008  declined screening colonoscopy  Lumbar laminectomy for spinal stenosis, July 2015  Family History:   fatherr died of cancer of unclear type  mother died of lung cancer   no siblings   Social History:   former smoker      Review of Systems  Constitutional: Negative for fever, chills, appetite change and fatigue.  HENT: Negative for congestion, dental problem, ear pain, hearing loss, sore throat, tinnitus, trouble swallowing and voice change.   Eyes: Negative for pain, discharge and visual disturbance.  Respiratory: Negative for cough, chest tightness, wheezing and stridor.   Cardiovascular: Negative for chest pain, palpitations and leg swelling.  Gastrointestinal: Negative for nausea, vomiting, abdominal pain, diarrhea, constipation, blood in stool and abdominal distention.  Genitourinary: Negative for urgency, hematuria, flank pain,  discharge, difficulty urinating and genital sores.  Musculoskeletal: Positive for gait problem. Negative for arthralgias, back pain, joint swelling, myalgias and neck stiffness.       Left hip pain  Skin: Negative for rash.  Neurological: Negative for dizziness, syncope, speech difficulty, weakness, numbness and headaches.  Hematological: Negative for  adenopathy. Does not bruise/bleed easily.  Psychiatric/Behavioral: Negative for behavioral problems and dysphoric mood. The patient is not nervous/anxious.        Objective:   Physical Exam  Constitutional: He appears well-developed and well-nourished.  HENT:  Head: Normocephalic and atraumatic.  Right Ear: External ear normal.  Left Ear: External ear normal.  Nose: Nose normal.  Mouth/Throat: Oropharynx is clear and moist.  Eyes: Conjunctivae and EOM are normal. Pupils are equal, round, and reactive to light. No scleral icterus.  Neck: Normal range of motion. Neck supple. No JVD present. No thyromegaly present.  Cardiovascular: Regular rhythm, normal heart sounds and intact distal pulses.  Exam reveals no gallop and no friction rub.   No murmur heard. Pulmonary/Chest: Effort normal and breath sounds normal. He exhibits no tenderness.  Abdominal: Soft. Bowel sounds are normal. He exhibits no distension and no mass. There is no tenderness.  Genitourinary: Penis normal. Guaiac negative stool.  Prostate not adequately examined due  to tight anal tone, and patient discomfort  Musculoskeletal: Normal range of motion. He exhibits no edema and no tenderness.  Lymphadenopathy:    He has no cervical adenopathy.  Neurological: He is alert. He has normal reflexes. No cranial nerve deficit. Coordination normal.  Skin: Skin is warm and dry. No rash noted.  Nicely healing laminectomy scar in the lumbar area  Psychiatric: He has a normal mood and affect. His behavior is normal.          Assessment & Plan:   Preventive health examination Status post lumbar laminectomy for lumbar spinal stenosis Osteoarthritis Hypertension stable Impaired glucose tolerance.  We'll check a hemoglobin A1c COPD stable Coronary artery disease, stable  Heart healthy diet recommended.  Recheck in 6 months

## 2014-02-23 NOTE — Progress Notes (Signed)
Pre visit review using our clinic review tool, if applicable. No additional management support is needed unless otherwise documented below in the visit note. 

## 2014-02-24 ENCOUNTER — Telehealth: Payer: Self-pay | Admitting: Internal Medicine

## 2014-02-24 DIAGNOSIS — E119 Type 2 diabetes mellitus without complications: Secondary | ICD-10-CM

## 2014-02-24 NOTE — Telephone Encounter (Signed)
Caller: Nell/Spouse; Phone: [phone]; Reason for Call: Spouse following up regarding A1C reading and advice to follow DM diet.  Spouse would like to have DM Dietitian consult and info on what type of foods Pt should be eating.  Discussed total carbs, how to count carbs and to increase protein, watch carb intake until Pt had f/u from Dietitian.

## 2014-02-25 NOTE — Telephone Encounter (Signed)
Pt notified referral order placed for Nutrition and Diabetic Counseling class and someone will contact him. Pt verbalized understanding.

## 2014-02-27 NOTE — Progress Notes (Signed)
01/31/14 0900  PT Time Calculation  PT Start Time 0849  PT Stop Time 0912  PT Time Calculation (min) 23 min  PT G-Codes **NOT FOR INPATIENT CLASS**  Functional Assessment Tool Used clinical judgement  Functional Limitation Mobility: Walking and moving around  Mobility: Walking and Moving Around Goal Status [phone](G8979) CI  Mobility: Walking and Moving Around Discharge Status (U0454(G8980) CI  PT General Charges  $$ ACUTE PT VISIT 1 Procedure  PT Treatments  $Gait Training 8-22 mins  $Therapeutic Activity 8-22 mins  clarification of G- code for eval completed by Kingsley CallanderKAren Smith, PT

## 2014-03-20 ENCOUNTER — Telehealth: Payer: Self-pay | Admitting: Internal Medicine

## 2014-03-20 NOTE — Telephone Encounter (Signed)
Spoke to pt, told him I can not refill his Rx till Monday he was given 120 tablets which should last for one month. Pt said he had to take more. Told pt he should only be taking every 4 hours and you said in your message your pain was better and wanted instructions changed to every 6 hours. Pt said it was. Told him okay I will not be able to fill till Monday. Pt verbalized understanding.

## 2014-03-20 NOTE — Telephone Encounter (Addendum)
Pt request refill HYDROcodone-acetaminophen (NORCO) 7.5-325 MG per tablet Pt states his pain is better and would like instructions to states every 6 hours instead of 4.

## 2014-03-23 MED ORDER — HYDROCODONE-ACETAMINOPHEN 7.5-325 MG PO TABS: 1.0000 | ORAL_TABLET | ORAL | Status: DC | PRN

## 2014-03-23 NOTE — Telephone Encounter (Signed)
Pt notified Rx ready for pickup. Rx printed and signed.  

## 2014-04-20 ENCOUNTER — Telehealth: Payer: Self-pay | Admitting: Internal Medicine

## 2014-04-20 NOTE — Telephone Encounter (Signed)
Pt request refill of the following: HYDROcodone-acetaminophen (NORCO) 7.5-325 MG per tablet ° ° ° °Phamacy: °   Pick up  °

## 2014-04-21 MED ORDER — HYDROCODONE-ACETAMINOPHEN 7.5-325 MG PO TABS: 1.0000 | ORAL_TABLET | ORAL | Status: DC | PRN

## 2014-04-21 NOTE — Telephone Encounter (Signed)
Pt notified Rx ready for pickup. Rx printed and signed.  

## 2014-05-12 ENCOUNTER — Encounter: Payer: Self-pay | Admitting: Internal Medicine

## 2014-05-12 ENCOUNTER — Ambulatory Visit (INDEPENDENT_AMBULATORY_CARE_PROVIDER_SITE_OTHER): Payer: Medicare Other | Admitting: Internal Medicine

## 2014-05-12 VITALS — BP 110/64 | HR 93 | Temp 99.2°F | Resp 20 | Ht 74.25 in | Wt 181.0 lb

## 2014-05-12 DIAGNOSIS — I10 Essential (primary) hypertension: Secondary | ICD-10-CM

## 2014-05-12 DIAGNOSIS — I2510 Atherosclerotic heart disease of native coronary artery without angina pectoris: Secondary | ICD-10-CM

## 2014-05-12 DIAGNOSIS — J441 Chronic obstructive pulmonary disease with (acute) exacerbation: Secondary | ICD-10-CM

## 2014-05-12 MED ORDER — AZITHROMYCIN 250 MG PO TABS: ORAL_TABLET | ORAL | Status: DC

## 2014-05-12 NOTE — Patient Instructions (Addendum)
Take over-the-counter expectorants and cough medications such as  Mucinex DM.  Call if there is no improvement in 5 to 7 days or if  you develop worsening cough, fever, or new symptoms, such as shortness of breath or chest pain.  Call or return to clinic prn if these symptoms worsen or fail to improve as anticipated.  Increase prednisone to twice daily for 1 week and then resume once daily in the morning

## 2014-05-12 NOTE — Progress Notes (Signed)
Subjective:    Patient ID: Christopher Dalton, male    DOB: 02-Jul-1941, 73 y.o.   MRN: [id number]  HPI 73 year old patient who has a history of COPD.  He also has CAD and treated hypertension.  For the past week he has had increasing cough, hoarseness, chest congestion.  Cough is been productive of green sputum.  Denies any wheezing but has had slight increase in dyspnea.  He generally feels unwell.  Medical regimen includes low-dose daily prednisone therapy.  Past Medical History  Diagnosis Date  . IMPAIRED GLUCOSE TOLERANCE 03/02/2008  . DEPRESSION 11/08/2009  . HYPERTENSION 01/21/2007  . C A D 11/15/2009  . HYPOTENSION 04/13/2009  . COPD 01/21/2007  . INGUINAL HERNIA, RIGHT 03/25/2007  . OSTEOARTHRITIS 01/21/2007  . HIP PAIN, LEFT 10/29/2008  . LOW BACK PAIN 01/21/2007  . GERD (gastroesophageal reflux disease)     History   Social History  . Marital Status: Married    Spouse Name: N/A    Number of Children: N/A  . Years of Education: N/A   Occupational History  . Not on file.   Social History Main Topics  . Smoking status: Former Games developermoker  . Smokeless tobacco: Former NeurosurgeonUser  . Alcohol Use: Yes     Comment: 1-2 beers  week  . Drug Use: No  . Sexual Activity: Not on file   Other Topics Concern  . Not on file   Social History Narrative    Past Surgical History  Procedure Laterality Date  . Tonsillectomy  unknown  . Reconstruction mid-face    . Inguinal hernia repair  2008    right  . Fracture surgery  1958    face  . Lumbar laminectomy/decompression microdiscectomy Left 01/28/2014    Procedure: Complete DECOMPRESSION lumbar laminectomy for spinal stenosis,  MICRODISCECTOMY  L3-4 ON LEFT for foraminotomy for L3-L4 nerve root;  Surgeon: Jacki Conesonald A Gioffre, MD;  Location: [facility];  Service: Orthopedics;  Laterality: Left;    Family History  Problem Relation Age of Onset  . Cancer Mother     lung ca  . Cancer Father     unclear type    Allergies  Allergen Reactions  .  Amoxicillin     REACTION: unspecified  . Barbiturates     REACTION: tongue swells, hands swell  . Doxycycline Other (See Comments)    unknown  . Pravastatin   . Sulfamethoxazole     REACTION: unspecified  . Statins Other (See Comments)    mucsle pain    Current Outpatient Prescriptions on File Prior to Visit  Medication Sig Dispense Refill  . acetaminophen (TYLENOL) 500 MG tablet Take 500 mg by mouth as needed.    . benazepril-hydrochlorthiazide (LOTENSIN HCT) 20-12.5 MG per tablet TAKE 1 TABLET BY MOUTH DAILY. 90 tablet 1  . CRANBERRY PO Take 1 tablet by mouth daily.    . diazepam (VALIUM) 5 MG tablet TAKE 1 TABLET AT BEDTIME AS NEEDED 90 tablet 0  . docusate sodium (COLACE) 100 MG capsule Take 1 capsule (100 mg total) by mouth 2 (two) times daily as needed for mild constipation. 20 capsule 1  . HYDROcodone-acetaminophen (NORCO) 7.5-325 MG per tablet Take 1 tablet by mouth every 4 (four) hours as needed for moderate pain. 120 tablet 0  . loratadine (CLARITIN) 10 MG tablet Take 10 mg by mouth daily.      . metFORMIN (GLUCOPHAGE) 500 MG tablet Take 1 tablet (500 mg total) by mouth 2 (two) times daily with  a meal. 180 tablet 1  . Multiple Vitamin (MULTIVITAMIN WITH MINERALS) TABS tablet Take 1 tablet by mouth daily.    . naproxen sodium (ALEVE) 220 MG tablet Take 220 mg by mouth as needed.    . predniSONE (DELTASONE) 5 MG tablet TAKE 1 TABLET BY MOUTH DAILY. 90 tablet 1  . tamsulosin (FLOMAX) 0.4 MG CAPS capsule Take 1 capsule (0.4 mg total) by mouth daily. 10 capsule 0   No current facility-administered medications on file prior to visit.    BP 110/64 mmHg  Pulse 93  Temp(Src) 99.2 F (37.3 C) (Oral)  Resp 20  Ht 6' 2.25" (1.886 m)  Wt 181 lb (82.101 kg)  BMI 23.08 kg/m2  SpO2 98%     Review of Systems  Constitutional: Positive for fever, activity change, appetite change and fatigue. Negative for chills.  HENT: Negative for congestion, dental problem, ear pain, hearing  loss, sore throat, tinnitus, trouble swallowing and voice change.   Eyes: Negative for pain, discharge and visual disturbance.  Respiratory: Positive for cough and shortness of breath. Negative for chest tightness, wheezing and stridor.   Cardiovascular: Negative for chest pain, palpitations and leg swelling.  Gastrointestinal: Negative for nausea, vomiting, abdominal pain, diarrhea, constipation, blood in stool and abdominal distention.  Genitourinary: Negative for urgency, hematuria, flank pain, discharge, difficulty urinating and genital sores.  Musculoskeletal: Negative for myalgias, back pain, joint swelling, arthralgias, gait problem and neck stiffness.  Skin: Negative for rash.  Neurological: Negative for dizziness, syncope, speech difficulty, weakness, numbness and headaches.  Hematological: Negative for adenopathy. Does not bruise/bleed easily.  Psychiatric/Behavioral: Negative for behavioral problems and dysphoric mood. The patient is not nervous/anxious.        Objective:   Physical Exam  Constitutional: He is oriented to person, place, and time. He appears well-developed.  HENT:  Head: Normocephalic.  Right Ear: External ear normal.  Left Ear: External ear normal.  Eyes: Conjunctivae and EOM are normal.  Neck: Normal range of motion.  Cardiovascular: Normal rate and normal heart sounds.   Pulmonary/Chest: Effort normal.  Scattered coarse rhonchi right lung O2 saturation 98% No acute distress No wheezing  Abdominal: Bowel sounds are normal.  Musculoskeletal: Normal range of motion. He exhibits no edema or tenderness.  Neurological: He is alert and oriented to person, place, and time.  Psychiatric: He has a normal mood and affect. His behavior is normal.          Assessment & Plan:   Exacerbation COPD.  Will increase prednisone to a twice a day regimen for 1 week.  Will treat with azithromycin.  Albuterol rescue if needed Hypertension, stable CAD stable

## 2014-05-12 NOTE — Progress Notes (Signed)
Pre visit review using our clinic review tool, if applicable. No additional management support is needed unless otherwise documented below in the visit note. 

## 2014-05-21 ENCOUNTER — Telehealth: Payer: Self-pay | Admitting: Internal Medicine

## 2014-05-21 MED ORDER — HYDROCODONE-ACETAMINOPHEN 7.5-325 MG PO TABS: 1.0000 | ORAL_TABLET | ORAL | Status: DC | PRN

## 2014-05-21 NOTE — Telephone Encounter (Signed)
Pt's wife notified Rx ready for pickup. Rx printed and signed. 

## 2014-05-21 NOTE — Telephone Encounter (Signed)
Pt needs new rx hydrocodone °

## 2014-06-22 ENCOUNTER — Telehealth: Payer: Self-pay | Admitting: Internal Medicine

## 2014-06-22 MED ORDER — HYDROCODONE-ACETAMINOPHEN 7.5-325 MG PO TABS: 1.0000 | ORAL_TABLET | ORAL | Status: DC | PRN

## 2014-06-22 NOTE — Telephone Encounter (Signed)
Tried to contact pt no answer, will try again later. 

## 2014-06-22 NOTE — Telephone Encounter (Signed)
Spoke to pt, told him Rx ready for pickup. He said he already picked it up, was in the area and stopped by. Told him okay . Rx printed and signed by Dr. Tawanna Coolerodd.

## 2014-06-22 NOTE — Telephone Encounter (Signed)
Patient needs re-fill on HYDROcodone-acetaminophen (NORCO) 7.5-325 MG per tablet. Spouse is aware Dr. Kirtland BouchardK is out of the office.

## 2014-07-21 ENCOUNTER — Telehealth: Payer: Self-pay | Admitting: Internal Medicine

## 2014-07-21 NOTE — Telephone Encounter (Signed)
Pt request refill of the following:  HYDROcodone-acetaminophen (NORCO) 7.5-325 MG per tablet ° ° °Phamacy: pick up  ° °

## 2014-07-23 ENCOUNTER — Other Ambulatory Visit: Payer: Self-pay | Admitting: Internal Medicine

## 2014-07-23 MED ORDER — HYDROCODONE-ACETAMINOPHEN 7.5-325 MG PO TABS: 1.0000 | ORAL_TABLET | ORAL | Status: DC | PRN

## 2014-07-23 NOTE — Telephone Encounter (Signed)
Pt notified Rx ready for pickup. Rx printed and signed.  

## 2014-08-19 ENCOUNTER — Telehealth: Payer: Self-pay | Admitting: Internal Medicine

## 2014-08-19 NOTE — Telephone Encounter (Signed)
Pt needs new rx hydrocodone °

## 2014-08-21 MED ORDER — HYDROCODONE-ACETAMINOPHEN 7.5-325 MG PO TABS: 1.0000 | ORAL_TABLET | ORAL | Status: DC | PRN

## 2014-08-21 NOTE — Telephone Encounter (Signed)
Pt notified Rx ready for pickup. Rx printed and signed.  

## 2014-08-28 ENCOUNTER — Ambulatory Visit (INDEPENDENT_AMBULATORY_CARE_PROVIDER_SITE_OTHER): Payer: Medicare Other | Admitting: Internal Medicine

## 2014-08-28 VITALS — BP 142/70 | Temp 97.9°F | Wt 186.0 lb

## 2014-08-28 DIAGNOSIS — E119 Type 2 diabetes mellitus without complications: Secondary | ICD-10-CM

## 2014-08-28 DIAGNOSIS — J3089 Other allergic rhinitis: Secondary | ICD-10-CM

## 2014-08-28 DIAGNOSIS — J449 Chronic obstructive pulmonary disease, unspecified: Secondary | ICD-10-CM

## 2014-08-28 DIAGNOSIS — I10 Essential (primary) hypertension: Secondary | ICD-10-CM

## 2014-08-28 LAB — HEMOGLOBIN A1C: Hgb A1c MFr Bld: 6.5 % (ref 4.6–6.5)

## 2014-08-28 MED ORDER — ONETOUCH LANCETS MISC: Status: DC

## 2014-08-28 MED ORDER — GLUCOSE BLOOD VI STRP: ORAL_STRIP | Status: DC

## 2014-08-28 MED ORDER — METFORMIN HCL 500 MG PO TABS: 500.0000 mg | ORAL_TABLET | Freq: Two times a day (BID) | ORAL | Status: DC

## 2014-08-28 MED ORDER — PREDNISONE 5 MG PO TABS: 5.0000 mg | ORAL_TABLET | Freq: Every day | ORAL | Status: DC

## 2014-08-28 NOTE — Progress Notes (Signed)
Subjective:    Patient ID: Christopher Dalton, male    DOB: February 23, 1941, 74 y.o.   MRN: [id number]  HPI  Lab Results  Component Value Date   HGBA1C 7.7* 02/23/2014   74 year old patient who has a long history of impaired glucose tolerance.  He was diagnosed with diabetes last fall and now is on metformin therapy.  No home blood sugar monitoring. He has essential hypertension and COPD, as well as allergic rhinitis.  He is maintained on low-dose prednisone therapy. Pulmonary status has been stable. He has chronic low back pain.  Past Medical History  Diagnosis Date  . IMPAIRED GLUCOSE TOLERANCE 03/02/2008  . DEPRESSION 11/08/2009  . HYPERTENSION 01/21/2007  . C A D 11/15/2009  . HYPOTENSION 04/13/2009  . COPD 01/21/2007  . INGUINAL HERNIA, RIGHT 03/25/2007  . OSTEOARTHRITIS 01/21/2007  . HIP PAIN, LEFT 10/29/2008  . LOW BACK PAIN 01/21/2007  . GERD (gastroesophageal reflux disease)     History   Social History  . Marital Status: Married    Spouse Name: N/A  . Number of Children: N/A  . Years of Education: N/A   Occupational History  . Not on file.   Social History Main Topics  . Smoking status: Former Games developermoker  . Smokeless tobacco: Former NeurosurgeonUser  . Alcohol Use: Yes     Comment: 1-2 beers  week  . Drug Use: No  . Sexual Activity: Not on file   Other Topics Concern  . Not on file   Social History Narrative    Past Surgical History  Procedure Laterality Date  . Tonsillectomy  unknown  . Reconstruction mid-face    . Inguinal hernia repair  2008    right  . Fracture surgery  1958    face  . Lumbar laminectomy/decompression microdiscectomy Left 01/28/2014    Procedure: Complete DECOMPRESSION lumbar laminectomy for spinal stenosis,  MICRODISCECTOMY  L3-4 ON LEFT for foraminotomy for L3-L4 nerve root;  Surgeon: Jacki Conesonald A Gioffre, MD;  Location: [facility];  Service: Orthopedics;  Laterality: Left;    Family History  Problem Relation Age of Onset  . Cancer Mother     lung ca  .  Cancer Father     unclear type    Allergies  Allergen Reactions  . Amoxicillin     REACTION: unspecified  . Barbiturates     REACTION: tongue swells, hands swell  . Doxycycline Other (See Comments)    unknown  . Pravastatin   . Sulfamethoxazole     REACTION: unspecified  . Statins Other (See Comments)    mucsle pain    Current Outpatient Prescriptions on File Prior to Visit  Medication Sig Dispense Refill  . acetaminophen (TYLENOL) 500 MG tablet Take 500 mg by mouth as needed.    Marland Kitchen. azithromycin (ZITHROMAX) 250 MG tablet 2 tablets once daily for 3 consecutive days 6 tablet 0  . benazepril-hydrochlorthiazide (LOTENSIN HCT) 20-12.5 MG per tablet TAKE 1 TABLET BY MOUTH DAILY. 90 tablet 1  . CRANBERRY PO Take 1 tablet by mouth daily.    . diazepam (VALIUM) 5 MG tablet TAKE 1 TABLET AT BEDTIME AS NEEDED 90 tablet 0  . docusate sodium (COLACE) 100 MG capsule Take 1 capsule (100 mg total) by mouth 2 (two) times daily as needed for mild constipation. 20 capsule 1  . HYDROcodone-acetaminophen (NORCO) 7.5-325 MG per tablet Take 1 tablet by mouth every 4 (four) hours as needed for moderate pain. 120 tablet 0  . loratadine (CLARITIN) 10 MG  tablet Take 10 mg by mouth daily.      . Multiple Vitamin (MULTIVITAMIN WITH MINERALS) TABS tablet Take 1 tablet by mouth daily.    . naproxen sodium (ALEVE) 220 MG tablet Take 220 mg by mouth as needed.    . predniSONE (DELTASONE) 5 MG tablet TAKE 1 TABLET BY MOUTH DAILY. 90 tablet 1  . tamsulosin (FLOMAX) 0.4 MG CAPS capsule Take 1 capsule (0.4 mg total) by mouth daily. 10 capsule 0   No current facility-administered medications on file prior to visit.    BP 142/70 mmHg  Temp(Src) 97.9 F (36.6 C) (Oral)  Wt 186 lb (84.369 kg)    Review of Systems  Constitutional: Negative for fever, chills, appetite change and fatigue.  HENT: Negative for congestion, dental problem, ear pain, hearing loss, sore throat, tinnitus, trouble swallowing and voice  change.   Eyes: Negative for pain, discharge and visual disturbance.  Respiratory: Positive for shortness of breath. Negative for cough, chest tightness, wheezing and stridor.   Cardiovascular: Negative for chest pain, palpitations and leg swelling.  Gastrointestinal: Negative for nausea, vomiting, abdominal pain, diarrhea, constipation, blood in stool and abdominal distention.  Genitourinary: Negative for urgency, hematuria, flank pain, discharge, difficulty urinating and genital sores.  Musculoskeletal: Positive for back pain. Negative for myalgias, joint swelling, arthralgias, gait problem and neck stiffness.  Skin: Negative for rash.  Neurological: Negative for dizziness, syncope, speech difficulty, weakness, numbness and headaches.  Hematological: Negative for adenopathy. Does not bruise/bleed easily.  Psychiatric/Behavioral: Negative for behavioral problems and dysphoric mood. The patient is not nervous/anxious.        Objective:   Physical Exam  Constitutional: He is oriented to person, place, and time. He appears well-developed.  HENT:  Head: Normocephalic.  Right Ear: External ear normal.  Left Ear: External ear normal.  Eyes: Conjunctivae and EOM are normal.  Neck: Normal range of motion.  Cardiovascular: Normal rate and normal heart sounds.   Pulmonary/Chest: Breath sounds normal.  Abdominal: Bowel sounds are normal.  Musculoskeletal: Normal range of motion. He exhibits no edema or tenderness.  Neurological: He is alert and oriented to person, place, and time.  Psychiatric: He has a normal mood and affect. His behavior is normal.          Assessment & Plan:    Diabetes mellitus.  Will check a hemoglobin A1c.  We'll give a glucometer.  Diabetic management discussed Hypertension, stable COPD Allergic rhinitis Chronic steroid use.  Will attempt to taper prednisone to 5 mg even days and 2.5 milligrams odd days.  Will check a bone density  Recheck 3 months

## 2014-08-28 NOTE — Patient Instructions (Addendum)
Please check your hemoglobin A1c every 3 months  Limit your sodium (Salt) intake  Decrease prednisone to 5 mg alternating with 2.5 milligrams every other day  Bone density study  Diabetes, Eating Away From Home Sometimes, you might eat in a restaurant or have meals that are prepared by someone else. You can enjoy eating out. However, the portions in restaurants may be much larger than needed. Listed below are some ideas to help you choose foods that will keep your blood glucose (sugar) in better control.  TIPS FOR EATING OUT  Know your meal plan and how many carbohydrate servings you should have at each meal. You may wish to carry a copy of your meal plan in your purse or wallet. Learn the foods included in each food group.  Make a list of restaurants near you that offer healthy choices. Take a copy of the carry-out menus to see what they offer. Then, you can plan what you will order ahead of time.  Become familiar with serving sizes by practicing them at home using measuring cups and spoons. Once you learn to recognize portion sizes, you will be able to correctly estimate the amount of total carbohydrate you are allowed to eat at the restaurant. Ask for a takeout box if the portion is more than you should have. When your food comes, leave the amount you should have on the plate, and put the rest in the takeout box before you start eating.  Plan ahead if your mealtime will be different from usual. Check with your caregiver to find out how to time meals and medicine if you are taking insulin.  Avoid high-fat foods, such as fried foods, cream sauces, high-fat salad dressings, or any added butter or margarine.  Do not be afraid to ask questions. Ask your server about the portion size, cooking methods, ingredients and if items can be substituted. Restaurants do not list all available items on the menu. You can ask for your main entree to be prepared using skim milk, oil instead of butter or  margarine, and without gravy or sauces. Ask your waiter or waitress to serve salad dressings, gravy, sauces, margarine, and sour cream on the side. You can then add the amount your meal plan suggests.  Add more vegetables whenever possible.  Avoid items that are labeled "jumbo," "giant," "deluxe," or "supersized."  You may want to split an entre with someone and order an extra side salad.  Watch for hidden calories in foods like croutons, bacon, or cheese.  Ask your server to take away the bread basket or chips from your table.  Order a dinner salad as an appetizer. You can eat most foods served in a restaurant. Some foods are better choices than others. Breads and Starches  Recommended: All kinds of bread (wheat, rye, white, oatmeal, [place], [place], raisin), hard or soft dinner rolls, frankfurter or hamburger buns, small bagels, small corn or whole-wheat flour tortillas.  Avoid: Frosted or glazed breads, butter rolls, egg or cheese breads, croissants, sweet rolls, pastries, coffee cake, glazed or frosted doughnuts, muffins. Crackers  Recommended: Animal crackers, graham, rye, saltine, oyster, and matzoth crackers. Bread sticks, melba toast, rusks, pretzels, popcorn (without fat), zwieback toast.  Avoid: High-fat snack crackers or chips. Buttered popcorn. Cereals  Recommended: Hot and cold cereals. Whole grains such as oatmeal or shredded wheat are good choices.  Avoid: Sugar-coated or granola type cereals. Potatoes/Pasta/Rice/Beans  Recommended: Order baked, boiled, or mashed potatoes, rice or noodles without added fat, whole beans.  Order gravies, butter, margarine, or sauces on the side so you can control the amount you add.  Avoid: Hash browns or fried potatoes. Potatoes, pasta, or rice prepared with cream or cheese sauce. Potato or pasta salads prepared with large amounts of dressing. Fried beans or fried rice. Vegetables  Recommended: Order steamed, baked, boiled, or stewed  vegetables without sauces or extra fat. Ask that sauce be served on the side. If vegetables are not listed on the menu, ask what is available.  Avoid: Vegetables prepared with cream, butter, or cheese sauce. Fried vegetables. Salad Bars  Recommended: Many of the vegetables at a salad bar are considered "free." Use lemon juice, vinegar, or low-calorie salad dressing (fewer than 20 calories per serving) as "free" dressings for your salad. Look for salad bar ingredients that have no added fat or sugar such as tomatoes, lettuce, cucumbers, broccoli, carrots, onions, and mushrooms.  Avoid: Prepared salads with large amounts of dressing, such as coleslaw, caesar salad, macaroni salad, bean salad, or carrot salad. Fruit  Recommended: Eat fresh fruit or fresh fruit salad without added dressing. A salad bar often offers fresh fruit choices, but canned fruit at a restaurant is usually packed in sugar or syrup.  Avoid: Sweetened canned or frozen fruits, plain or sweetened fruit juice. Fruit salads with dressing, sour cream, or sugar added to them. Meat and Meat Substitutes  Recommended: Order broiled, baked, roasted, or grilled meat, poultry, or fish. Trim off all visible fat. Do not eat the skin of poultry. The size stated on the menu is the raw weight. Meat shrinks by  in cooking (for example, 4 oz raw equals 3 oz cooked meat).  Avoid: Deep-fat fried meat, poultry, or fish. Breaded meats. Eggs  Recommended: Order soft, hard-cooked, poached, or scrambled eggs. Omelets may be okay, depending on what ingredients are added. Egg substitutes are also a good choice.  Avoid: Fried eggs, eggs prepared with cream or cheese sauce. Milk  Recommended: Order low-fat or fat-free milk according to your meal plan. Plain, nonfat yogurt or flavored yogurt with no sugar added may be used as a substitute for milk. Soy milk may also be used.  Avoid: Milk shakes or sweetened milk beverages. Soups and Combination  Foods  Recommended: Clear broth or consomm are "free" foods and may be used as an appetizer. Broth-based soups with fat removed count as a starch serving and are preferred over cream soups. Soups made with beans or split peas may be eaten but count as a starch.  Avoid: Fatty soups, soup made with cream, cheese soup. Combination foods prepared with excessive amounts of fat or with cream or cheese sauces. Desserts and Sweets  Recommended: Ask for fresh fruit. Sponge or angel food cake without icing, ice milk, no sugar added ice cream, sherbet, or frozen yogurt may fit into your meal plan occasionally.  Avoid: Pastries, puddings, pies, cakes with icing, custard, gelatin desserts. Fats and Oils  Recommended: Choose healthy fats such as olive oil, canola oil, or tub margarine, reduced fat or fat-free sour cream, cream cheese, avocado, or nuts.  Avoid: Any fats in excess of your allowed portion. Deep-fried foods or any food with a large amount of fat. Note: Ask for all fats to be served on the side, and limit your portion sizes according to your meal plan. Document Released: 06/26/2005 Document Revised: 09/18/2011 Document Reviewed: 09/23/2013 [facility] Patient Information 2015 [place], [place]. This information is not intended to replace advice given to you by your health care  provider. Make sure you discuss any questions you have with your health care provider. Type 2 Diabetes Mellitus Type 2 diabetes mellitus, often simply referred to as type 2 diabetes, is a long-lasting (chronic) disease. In type 2 diabetes, the pancreas does not make enough insulin (a hormone), the cells are less responsive to the insulin that is made (insulin resistance), or both. Normally, insulin moves sugars from food into the tissue cells. The tissue cells use the sugars for energy. The lack of insulin or the lack of normal response to insulin causes excess sugars to build up in the blood instead of going into the tissue  cells. As a result, high blood sugar (hyperglycemia) develops. The effect of high sugar (glucose) levels can cause many complications. Type 2 diabetes was also previously called adult-onset diabetes, but it can occur at any age.  RISK FACTORS  A person is predisposed to developing type 2 diabetes if someone in the family has the disease and also has one or more of the following primary risk factors:  Overweight.  An inactive lifestyle.  A history of consistently eating high-calorie foods. Maintaining a normal weight and regular physical activity can reduce the chance of developing type 2 diabetes. SYMPTOMS  A person with type 2 diabetes may not show symptoms initially. The symptoms of type 2 diabetes appear slowly. The symptoms include:  Increased thirst (polydipsia).  Increased urination (polyuria).  Increased urination during the night (nocturia).  Weight loss. This weight loss may be rapid.  Frequent, recurring infections.  Tiredness (fatigue).  Weakness.  Vision changes, such as blurred vision.  Fruity smell to your breath.  Abdominal pain.  Nausea or vomiting.  Cuts or bruises which are slow to heal.  Tingling or numbness in the hands or feet. DIAGNOSIS Type 2 diabetes is frequently not diagnosed until complications of diabetes are present. Type 2 diabetes is diagnosed when symptoms or complications are present and when blood glucose levels are increased. Your blood glucose level may be checked by one or more of the following blood tests:  A fasting blood glucose test. You will not be allowed to eat for at least 8 hours before a blood sample is taken.  A random blood glucose test. Your blood glucose is checked at any time of the day regardless of when you ate.  A hemoglobin A1c blood glucose test. A hemoglobin A1c test provides information about blood glucose control over the previous 3 months.  An oral glucose tolerance test (OGTT). Your blood glucose is  measured after you have not eaten (fasted) for 2 hours and then after you drink a glucose-containing beverage. TREATMENT   You may need to take insulin or diabetes medicine daily to keep blood glucose levels in the desired range.  If you use insulin, you may need to adjust the dosage depending on the carbohydrates that you eat with each meal or snack. The treatment goal is to maintain the before meal blood sugar (preprandial glucose) level at 70-130 mg/dL. HOME CARE INSTRUCTIONS   Have your hemoglobin A1c level checked twice a year.  Perform daily blood glucose monitoring as directed by your health care provider.  Monitor urine ketones when you are ill and as directed by your health care provider.  Take your diabetes medicine or insulin as directed by your health care provider to maintain your blood glucose levels in the desired range.  Never run out of diabetes medicine or insulin. It is needed every day.  If you are using insulin,  you may need to adjust the amount of insulin given based on your intake of carbohydrates. Carbohydrates can raise blood glucose levels but need to be included in your diet. Carbohydrates provide vitamins, minerals, and fiber which are an essential part of a healthy diet. Carbohydrates are found in fruits, vegetables, whole grains, dairy products, legumes, and foods containing added sugars.  Eat healthy foods. You should make an appointment to see a registered dietitian to help you create an eating plan that is right for you.  Lose weight if you are overweight.  Carry a medical alert card or wear your medical alert jewelry.  Carry a 15-gram carbohydrate snack with you at all times to treat low blood glucose (hypoglycemia). Some examples of 15-gram carbohydrate snacks include:  Glucose tablets, 3 or 4.  Glucose gel, 15-gram tube.  Raisins, 2 tablespoons (24 grams).  Jelly beans, 6.  Animal crackers, 8.  Regular pop, 4 ounces (120 mL).  Gummy treats,  9.  Recognize hypoglycemia. Hypoglycemia occurs with blood glucose levels of 70 mg/dL and below. The risk for hypoglycemia increases when fasting or skipping meals, during or after intense exercise, and during sleep. Hypoglycemia symptoms can include:  Tremors or shakes.  Decreased ability to concentrate.  Sweating.  Increased heart rate.  Headache.  Dry mouth.  Hunger.  Irritability.  Anxiety.  Restless sleep.  Altered speech or coordination.  Confusion.  Treat hypoglycemia promptly. If you are alert and able to safely swallow, follow the 15:15 rule:  Take 15-20 grams of rapid-acting glucose or carbohydrate. Rapid-acting options include glucose gel, glucose tablets, or 4 ounces (120 mL) of fruit juice, regular soda, or low-fat milk.  Check your blood glucose level 15 minutes after taking the glucose.  Take 15-20 grams more of glucose if the repeat blood glucose level is still 70 mg/dL or below.  Eat a meal or snack within 1 hour once blood glucose levels return to normal.  Be alert to feeling very thirsty and urinating more frequently than usual, which are early signs of hyperglycemia. An early awareness of hyperglycemia allows for prompt treatment. Treat hyperglycemia as directed by your health care provider.  Engage in at least 150 minutes of moderate-intensity physical activity a week, spread over at least 3 days of the week or as directed by your health care provider. In addition, you should engage in resistance exercise at least 2 times a week or as directed by your health care provider. Try to spend no more than 90 minutes at one time inactive.  Adjust your medicine and food intake as needed if you start a new exercise or sport.  Follow your sick-day plan anytime you are unable to eat or drink as usual.  Do not use any tobacco products including cigarettes, chewing tobacco, or electronic cigarettes. If you need help quitting, ask your health care  provider.  Limit alcohol intake to no more than 1 drink per day for nonpregnant women and 2 drinks per day for men. You should drink alcohol only when you are also eating food. Talk with your health care provider whether alcohol is safe for you. Tell your health care provider if you drink alcohol several times a week.  Keep all follow-up visits as directed by your health care provider. This is important.  Schedule an eye exam soon after the diagnosis of type 2 diabetes and then annually.  Perform daily skin and foot care. Examine your skin and feet daily for cuts, bruises, redness, nail problems, bleeding, blisters,  or sores. A foot exam by a health care provider should be done annually.  Brush your teeth and gums at least twice a day and floss at least once a day. Follow up with your dentist regularly.  Share your diabetes management plan with your workplace or school.  Stay up-to-date with immunizations. It is recommended that people with diabetes who are over 47 years old get the pneumonia vaccine. In some cases, two separate shots may be given. Ask your health care provider if your pneumonia vaccination is up-to-date.  Learn to manage stress.  Obtain ongoing diabetes education and support as needed.  Participate in or seek rehabilitation as needed to maintain or improve independence and quality of life. Request a physical or occupational therapy referral if you are having foot or hand numbness, or difficulties with grooming, dressing, eating, or physical activity. SEEK MEDICAL CARE IF:   You are unable to eat food or drink fluids for more than 6 hours.  You have nausea and vomiting for more than 6 hours.  Your blood glucose level is over 240 mg/dL.  There is a change in mental status.  You develop an additional serious illness.  You have diarrhea for more than 6 hours.  You have been sick or have had a fever for a couple of days and are not getting better.  You have pain  during any physical activity.  SEEK IMMEDIATE MEDICAL CARE IF:  You have difficulty breathing.  You have moderate to large ketone levels. MAKE SURE YOU:  Understand these instructions.  Will watch your condition.  Will get help right away if you are not doing well or get worse. Document Released: 06/26/2005 Document Revised: 11/10/2013 Document Reviewed: 01/23/2012 [facility] Patient Information 2015 [place], [place]. This information is not intended to replace advice given to you by your health care provider. Make sure you discuss any questions you have with your health care provider.

## 2014-08-28 NOTE — Progress Notes (Signed)
Pre visit review using our clinic review tool, if applicable. No additional management support is needed unless otherwise documented below in the visit note. 

## 2014-09-15 ENCOUNTER — Telehealth: Payer: Self-pay | Admitting: Internal Medicine

## 2014-09-15 NOTE — Telephone Encounter (Signed)
Pt needs new hydrocodone °

## 2014-09-18 MED ORDER — HYDROCODONE-ACETAMINOPHEN 7.5-325 MG PO TABS: 1.0000 | ORAL_TABLET | ORAL | Status: DC | PRN

## 2014-09-18 NOTE — Telephone Encounter (Signed)
Pt notified Rx ready for pickup. Rx printed and signed.  

## 2014-10-16 ENCOUNTER — Telehealth: Payer: Self-pay | Admitting: Internal Medicine

## 2014-10-16 NOTE — Telephone Encounter (Signed)
Pt request refill HYDROcodone-acetaminophen (NORCO) 7.5-325 MG per  °

## 2014-10-19 MED ORDER — HYDROCODONE-ACETAMINOPHEN 7.5-325 MG PO TABS: 1.0000 | ORAL_TABLET | ORAL | Status: DC | PRN

## 2014-10-19 NOTE — Telephone Encounter (Signed)
Pt notified Rx ready for pickup. Rx printed and signed.  

## 2014-11-16 ENCOUNTER — Telehealth: Payer: Self-pay | Admitting: Internal Medicine

## 2014-11-16 NOTE — Telephone Encounter (Signed)
Needs a refill on his Hydrocodone please. Call when ready for pick up.

## 2014-11-18 MED ORDER — HYDROCODONE-ACETAMINOPHEN 7.5-325 MG PO TABS: 1.0000 | ORAL_TABLET | ORAL | Status: DC | PRN

## 2014-11-18 NOTE — Telephone Encounter (Signed)
Pt's wife Carley Hammedva notified pt's Rx ready for pickup. Rx printed and signed. Carley Hammedva verbalized understanding.

## 2014-11-26 ENCOUNTER — Ambulatory Visit (INDEPENDENT_AMBULATORY_CARE_PROVIDER_SITE_OTHER): Payer: Medicare Other | Admitting: Internal Medicine

## 2014-11-26 ENCOUNTER — Encounter: Payer: Self-pay | Admitting: Internal Medicine

## 2014-11-26 VITALS — BP 136/76 | HR 78 | Temp 98.0°F | Resp 20 | Ht 74.25 in | Wt 183.0 lb

## 2014-11-26 DIAGNOSIS — M545 Low back pain, unspecified: Secondary | ICD-10-CM

## 2014-11-26 DIAGNOSIS — J3081 Allergic rhinitis due to animal (cat) (dog) hair and dander: Secondary | ICD-10-CM

## 2014-11-26 DIAGNOSIS — E119 Type 2 diabetes mellitus without complications: Secondary | ICD-10-CM

## 2014-11-26 DIAGNOSIS — M159 Polyosteoarthritis, unspecified: Secondary | ICD-10-CM

## 2014-11-26 DIAGNOSIS — I10 Essential (primary) hypertension: Secondary | ICD-10-CM | POA: Diagnosis not present

## 2014-11-26 DIAGNOSIS — M150 Primary generalized (osteo)arthritis: Secondary | ICD-10-CM | POA: Diagnosis not present

## 2014-11-26 LAB — HEMOGLOBIN A1C: Hgb A1c MFr Bld: 6.2 % (ref 4.6–6.5)

## 2014-11-26 MED ORDER — DIAZEPAM 5 MG PO TABS: 5.0000 mg | ORAL_TABLET | Freq: Every evening | ORAL | Status: DC | PRN

## 2014-11-26 MED ORDER — PREDNISONE 5 MG PO TABS: ORAL_TABLET | ORAL | Status: DC

## 2014-11-26 NOTE — Progress Notes (Signed)
Subjective:    Patient ID: Christopher Dalton, male    DOB: Oct 20, 1940, 74 y.o.   MRN: [id number]  HPI Lab Results  Component Value Date   HGBA1C 6.5 08/28/2014    Wt Readings from Last 3 Encounters:  11/26/14 183 lb (83.008 kg)  08/28/14 186 lb (84.369 kg)  05/12/14 181 lb (82.69101 kg)    74 year old patient who is seen today for his quarterly follow-up.  He is now on metformin therapy due to type 2 diabetes.  Last hemoglobin A1c 6.5.  Prednisone has been down titrated to 2.5 milligrams daily.  His pulmonary status has been stable but he is concerned about worsening allergy symptoms off prednisone.  He has a history of essential hypertension which has been stable. On complaint today is his chronic low back pain.  Past Medical History  Diagnosis Date  . IMPAIRED GLUCOSE TOLERANCE 03/02/2008  . DEPRESSION 11/08/2009  . HYPERTENSION 01/21/2007  . C A D 11/15/2009  . HYPOTENSION 04/13/2009  . COPD 01/21/2007  . INGUINAL HERNIA, RIGHT 03/25/2007  . OSTEOARTHRITIS 01/21/2007  . HIP PAIN, LEFT 10/29/2008  . LOW BACK PAIN 01/21/2007  . GERD (gastroesophageal reflux disease)     History   Social History  . Marital Status: Married    Spouse Name: N/A  . Number of Children: N/A  . Years of Education: N/A   Occupational History  . Not on file.   Social History Main Topics  . Smoking status: Former Games developermoker  . Smokeless tobacco: Former NeurosurgeonUser  . Alcohol Use: Yes     Comment: 1-2 beers  week  . Drug Use: No  . Sexual Activity: Not on file   Other Topics Concern  . Not on file   Social History Narrative    Past Surgical History  Procedure Laterality Date  . Tonsillectomy  unknown  . Reconstruction mid-face    . Inguinal hernia repair  2008    right  . Fracture surgery  1958    face  . Lumbar laminectomy/decompression microdiscectomy Left 01/28/2014    Procedure: Complete DECOMPRESSION lumbar laminectomy for spinal stenosis,  MICRODISCECTOMY  L3-4 ON LEFT for foraminotomy for L3-L4  nerve root;  Surgeon: Jacki Conesonald A Gioffre, MD;  Location: [facility];  Service: Orthopedics;  Laterality: Left;    Family History  Problem Relation Age of Onset  . Cancer Mother     lung ca  . Cancer Father     unclear type    Allergies  Allergen Reactions  . Amoxicillin     REACTION: unspecified  . Barbiturates     REACTION: tongue swells, hands swell  . Doxycycline Other (See Comments)    unknown  . Pravastatin   . Sulfamethoxazole     REACTION: unspecified  . Statins Other (See Comments)    mucsle pain    Current Outpatient Prescriptions on File Prior to Visit  Medication Sig Dispense Refill  . acetaminophen (TYLENOL) 500 MG tablet Take 500 mg by mouth as needed.    Marland Kitchen. azithromycin (ZITHROMAX) 250 MG tablet 2 tablets once daily for 3 consecutive days 6 tablet 0  . benazepril-hydrochlorthiazide (LOTENSIN HCT) 20-12.5 MG per tablet TAKE 1 TABLET BY MOUTH DAILY. 90 tablet 1  . CRANBERRY PO Take 1 tablet by mouth daily.    . diazepam (VALIUM) 5 MG tablet TAKE 1 TABLET AT BEDTIME AS NEEDED 90 tablet 0  . docusate sodium (COLACE) 100 MG capsule Take 1 capsule (100 mg total) by mouth 2 (two)  times daily as needed for mild constipation. 20 capsule 1  . glucose blood (ONETOUCH VERIO) test strip Use as instructed 100 each 12  . HYDROcodone-acetaminophen (NORCO) 7.5-325 MG per tablet Take 1 tablet by mouth every 4 (four) hours as needed for moderate pain. 120 tablet 0  . loratadine (CLARITIN) 10 MG tablet Take 10 mg by mouth daily.      . metFORMIN (GLUCOPHAGE) 500 MG tablet Take 1 tablet (500 mg total) by mouth 2 (two) times daily with a meal. 180 tablet 2  . Multiple Vitamin (MULTIVITAMIN WITH MINERALS) TABS tablet Take 1 tablet by mouth daily.    . naproxen sodium (ALEVE) 220 MG tablet Take 220 mg by mouth as needed.    . ONE TOUCH LANCETS MISC Test daily 200 each 5  . tamsulosin (FLOMAX) 0.4 MG CAPS capsule Take 1 capsule (0.4 mg total) by mouth daily. 10 capsule 0  . predniSONE  (DELTASONE) 5 MG tablet Take 1 tablet (5 mg total) by mouth daily. (Patient not taking: Reported on 11/26/2014) 90 tablet 1   No current facility-administered medications on file prior to visit.    BP 150/86 mmHg  Pulse 78  Temp(Src) 98 F (36.7 C) (Oral)  Resp 20  Ht 6' 2.25" (1.886 m)  Wt 183 lb (83.008 kg)  BMI 23.34 kg/m2  SpO2 98%      Review of Systems  Constitutional: Negative for fever, chills, appetite change and fatigue.  HENT: Negative for congestion, dental problem, ear pain, hearing loss, sore throat, tinnitus, trouble swallowing and voice change.   Eyes: Negative for pain, discharge and visual disturbance.  Respiratory: Negative for cough, chest tightness, wheezing and stridor.   Cardiovascular: Negative for chest pain, palpitations and leg swelling.  Gastrointestinal: Negative for nausea, vomiting, abdominal pain, diarrhea, constipation, blood in stool and abdominal distention.  Genitourinary: Negative for urgency, hematuria, flank pain, discharge, difficulty urinating and genital sores.  Musculoskeletal: Positive for back pain. Negative for myalgias, joint swelling, arthralgias, gait problem and neck stiffness.  Skin: Negative for rash.  Neurological: Negative for dizziness, syncope, speech difficulty, weakness, numbness and headaches.  Hematological: Negative for adenopathy. Does not bruise/bleed easily.  Psychiatric/Behavioral: Negative for behavioral problems and dysphoric mood. The patient is not nervous/anxious.        Objective:   Physical Exam  Constitutional: He is oriented to person, place, and time. He appears well-developed. No distress.  Blood pressure 136/76  HENT:  Head: Normocephalic.  Right Ear: External ear normal.  Left Ear: External ear normal.  Eyes: Conjunctivae and EOM are normal.  Neck: Normal range of motion.  Cardiovascular: Normal rate and normal heart sounds.   Pulmonary/Chest: Breath sounds normal. No respiratory distress. He  has no wheezes. He has no rales.  O2 saturation 98%  Abdominal: Bowel sounds are normal.  Musculoskeletal: Normal range of motion. He exhibits no edema or tenderness.  Neurological: He is alert and oriented to person, place, and time.  Psychiatric: He has a normal mood and affect. His behavior is normal.          Assessment & Plan:   COPD.  Patient has done well on 2.5 milligrams of prednisone daily.  We'll decrease to a every other day regimen Essential hypertension, stable Diabetes mellitus.  Will check a hemoglobin A1c Chronic low back pain Osteoarthritis  All medications updated

## 2014-11-26 NOTE — Patient Instructions (Signed)
Limit your sodium (Salt) intake  Please check your blood pressure on a regular basis.  If it is consistently greater than 150/90, please make an office appointment.    It is important that you exercise regularly, at least 20 minutes 3 to 4 times per week.  If you develop chest pain or shortness of breath seek  medical attention.  Return in 6 months for follow-up  Decrease prednisone to 2.5 milligrams every other day (even days of the week)

## 2014-11-26 NOTE — Progress Notes (Signed)
Pre visit review using our clinic review tool, if applicable. No additional management support is needed unless otherwise documented below in the visit note. 

## 2014-12-09 ENCOUNTER — Telehealth: Payer: Self-pay | Admitting: Internal Medicine

## 2014-12-09 NOTE — Telephone Encounter (Signed)
Pt request refill of the following: HYDROcodone-acetaminophen (NORCO) 7.5-325 MG per tablet  Pt would like to pick up 12/18/14     Phamacy:

## 2014-12-17 MED ORDER — HYDROCODONE-ACETAMINOPHEN 7.5-325 MG PO TABS: 1.0000 | ORAL_TABLET | ORAL | Status: DC | PRN

## 2014-12-17 NOTE — Telephone Encounter (Signed)
Pt's wife Carley Hammed notified Rx ready for pickup. Rx printed and signed.

## 2015-01-13 ENCOUNTER — Other Ambulatory Visit: Payer: Self-pay | Admitting: Internal Medicine

## 2015-01-14 ENCOUNTER — Telehealth: Payer: Self-pay | Admitting: Internal Medicine

## 2015-01-14 NOTE — Telephone Encounter (Signed)
Pt request refill HYDROcodone-acetaminophen (NORCO) 7.5-325 MG per tablet °

## 2015-01-15 MED ORDER — HYDROCODONE-ACETAMINOPHEN 7.5-325 MG PO TABS: 1.0000 | ORAL_TABLET | ORAL | Status: DC | PRN

## 2015-01-15 NOTE — Telephone Encounter (Signed)
Pt notified Rx ready for pickup. Rx printed and signed.  

## 2015-02-11 ENCOUNTER — Telehealth: Payer: Self-pay | Admitting: Internal Medicine

## 2015-02-11 MED ORDER — HYDROCODONE-ACETAMINOPHEN 7.5-325 MG PO TABS: 1.0000 | ORAL_TABLET | ORAL | Status: DC | PRN

## 2015-02-11 NOTE — Telephone Encounter (Signed)
Rx printed and the pt is aware this was left at the front desk to pick up.

## 2015-02-11 NOTE — Telephone Encounter (Signed)
ok 

## 2015-02-11 NOTE — Telephone Encounter (Signed)
Pt request refill HYDROcodone-acetaminophen (NORCO/VICODIN) 5-325 MG per tablet °

## 2015-03-09 ENCOUNTER — Other Ambulatory Visit: Payer: Self-pay | Admitting: Internal Medicine

## 2015-03-12 ENCOUNTER — Telehealth: Payer: Self-pay | Admitting: Internal Medicine

## 2015-03-12 MED ORDER — HYDROCODONE-ACETAMINOPHEN 7.5-325 MG PO TABS: 1.0000 | ORAL_TABLET | ORAL | Status: DC | PRN

## 2015-03-12 NOTE — Telephone Encounter (Signed)
Pt request refill of the following: HYDROcodone-acetaminophen (NORCO) 7.5-325 MG per tablet ° ° °Phamacy: ° °

## 2015-03-12 NOTE — Telephone Encounter (Signed)
Ready for pick up

## 2015-03-12 NOTE — Telephone Encounter (Signed)
Attempted to call pt- no voicemail.  Rx is ready for pick up.  

## 2015-03-12 NOTE — Telephone Encounter (Signed)
Rx last filled on 8.4.2016.  Pt last seen on 5.19.2016.  Pls advise if rx can be refilled.

## 2015-04-12 ENCOUNTER — Telehealth: Payer: Self-pay | Admitting: Internal Medicine

## 2015-04-12 MED ORDER — HYDROCODONE-ACETAMINOPHEN 7.5-325 MG PO TABS: 1.0000 | ORAL_TABLET | ORAL | Status: DC | PRN

## 2015-04-12 NOTE — Telephone Encounter (Signed)
Pt notified Rx ready for pickup. Rx printed and signed.  

## 2015-04-12 NOTE — Telephone Encounter (Signed)
Pt request refill HYDROcodone-acetaminophen (NORCO) 7.5-325 MG per tablet °

## 2015-04-13 ENCOUNTER — Other Ambulatory Visit: Payer: Self-pay | Admitting: Internal Medicine

## 2015-05-07 ENCOUNTER — Telehealth: Payer: Self-pay | Admitting: Internal Medicine

## 2015-05-07 NOTE — Telephone Encounter (Signed)
° ° °  Pt request refill of the following: ° °HYDROcodone-acetaminophen (NORCO) 7.5-325 MG tablet ° ° °Phamacy: °

## 2015-05-11 MED ORDER — HYDROCODONE-ACETAMINOPHEN 7.5-325 MG PO TABS: 1.0000 | ORAL_TABLET | ORAL | Status: DC | PRN

## 2015-05-11 NOTE — Telephone Encounter (Signed)
Left message on voicemail Rx ready for pickup will be at the front desk. Rx printed and signed.  

## 2015-06-08 ENCOUNTER — Telehealth: Payer: Self-pay | Admitting: Internal Medicine

## 2015-06-08 NOTE — Telephone Encounter (Signed)
° ° °  Pt request refill of the following: ° °HYDROcodone-acetaminophen (NORCO) 7.5-325 MG tablet ° ° °Phamacy: °

## 2015-06-09 MED ORDER — HYDROCODONE-ACETAMINOPHEN 7.5-325 MG PO TABS
1.0000 | ORAL_TABLET | ORAL | Status: DC | PRN
Start: 2015-06-09 — End: 2015-07-08

## 2015-06-09 NOTE — Telephone Encounter (Signed)
Pt notified Rx ready for pickup. Rx printed and signed.  

## 2015-07-07 ENCOUNTER — Telehealth: Payer: Self-pay | Admitting: Internal Medicine

## 2015-07-07 ENCOUNTER — Encounter: Payer: Self-pay | Admitting: *Deleted

## 2015-07-07 NOTE — Telephone Encounter (Signed)
Christopher Dalton, the pt's wife called saying he needs a refill of Hydrocodone-Acetaminophen. Please give her a phone call the Rx is ready to be picked up.  Pt's ph# [phone] Thank you.

## 2015-07-08 MED ORDER — HYDROCODONE-ACETAMINOPHEN 7.5-325 MG PO TABS: 1.0000 | ORAL_TABLET | ORAL | Status: DC | PRN

## 2015-07-08 NOTE — Telephone Encounter (Signed)
Pt notified Rx ready for pickup. Rx printed and signed by Dr. Fry.  

## 2015-08-04 ENCOUNTER — Telehealth: Payer: Self-pay | Admitting: Internal Medicine

## 2015-08-04 NOTE — Telephone Encounter (Signed)
Pt request refill  °HYDROcodone-acetaminophen (NORCO) 7.5-325 MG tablet °

## 2015-08-05 MED ORDER — HYDROCODONE-ACETAMINOPHEN 7.5-325 MG PO TABS: 1.0000 | ORAL_TABLET | ORAL | Status: DC | PRN

## 2015-08-05 NOTE — Telephone Encounter (Signed)
Pt notified Rx ready for pickup. Rx printed and signed.  

## 2015-08-25 ENCOUNTER — Other Ambulatory Visit: Payer: Self-pay | Admitting: Internal Medicine

## 2015-09-06 ENCOUNTER — Telehealth: Payer: Self-pay | Admitting: Internal Medicine

## 2015-09-06 MED ORDER — HYDROCODONE-ACETAMINOPHEN 7.5-325 MG PO TABS: 1.0000 | ORAL_TABLET | ORAL | Status: DC | PRN

## 2015-09-06 NOTE — Telephone Encounter (Signed)
Pt notified Rx ready for pickup. Rx printed and signed.  

## 2015-09-06 NOTE — Telephone Encounter (Signed)
Pt request refill  HYDROcodone-acetaminophen (NORCO) 7.5-325 MG tablet  120 tab

## 2015-09-25 ENCOUNTER — Other Ambulatory Visit: Payer: Self-pay | Admitting: Internal Medicine

## 2015-10-05 ENCOUNTER — Telehealth: Payer: Self-pay | Admitting: Internal Medicine

## 2015-10-05 MED ORDER — HYDROCODONE-ACETAMINOPHEN 7.5-325 MG PO TABS: 1.0000 | ORAL_TABLET | ORAL | Status: DC | PRN

## 2015-10-05 NOTE — Telephone Encounter (Signed)
Pt notified Rx ready for pickup. Rx printed and signed.  

## 2015-10-05 NOTE — Telephone Encounter (Signed)
° ° °  Pt request refill of the following: ° °HYDROcodone-acetaminophen (NORCO) 7.5-325 MG tablet ° ° °Phamacy: °

## 2015-11-01 IMAGING — CT CT L SPINE W/ CM
4 of 11 series · 11 of 33 positions shown, 13 images · non-contrast
Comparison: None

CLINICAL DATA: Lumbar spinal stenosis.  Herniated nucleus pulposis.
TECHNIQUE: Contiguous axial images were obtained through the Lumbar spine after
the intrathecal infusion of infusion. Coronal and sagittal
reconstructions were obtained of the axial image sets.

[Series 2: l spine bone · axial · 0.27mm/px · z∈[-411,-134]mm · 3 of 112 slices shown, 4 images]
[im 1/112  soft-tissue]
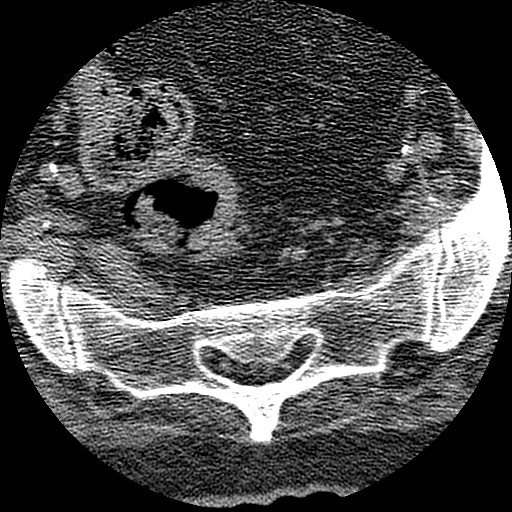
[im 1/112  bone]
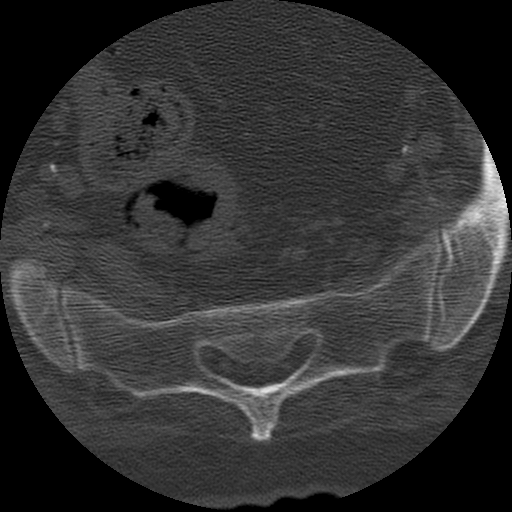
[im 56/112  bone]
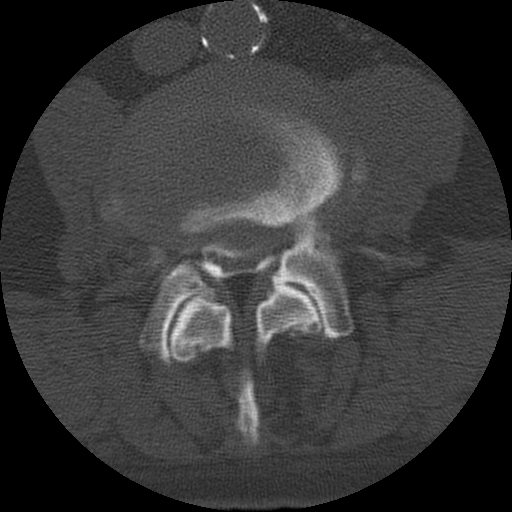
[im 112/112  bone]
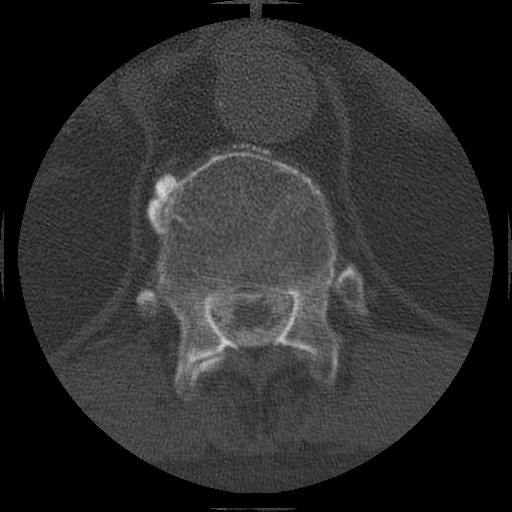

[Series 3: l spine soft · axial · 0.27mm/px · z∈[-319,-226]mm · 2 of 112 slices shown]
[im 38/112  soft-tissue]
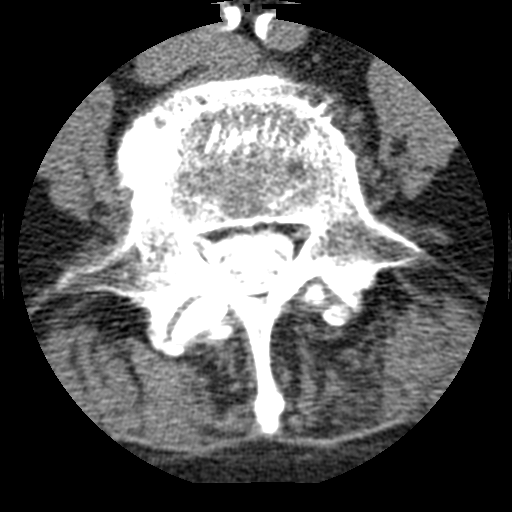
[im 75/112  soft-tissue]
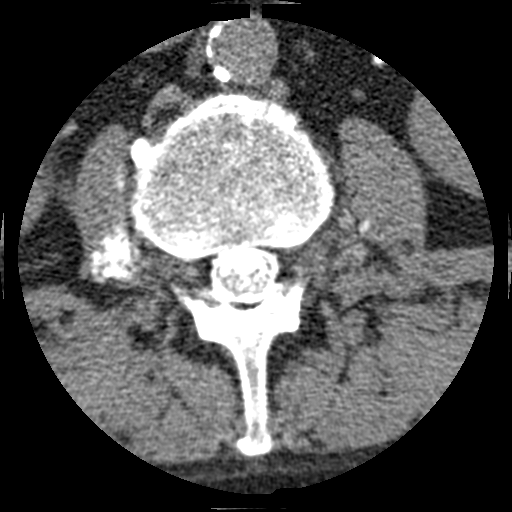

[Series 401: cor lower · coronal · 0.55mm/px · 1 of 61 slices shown]
[im 31/61  bone]
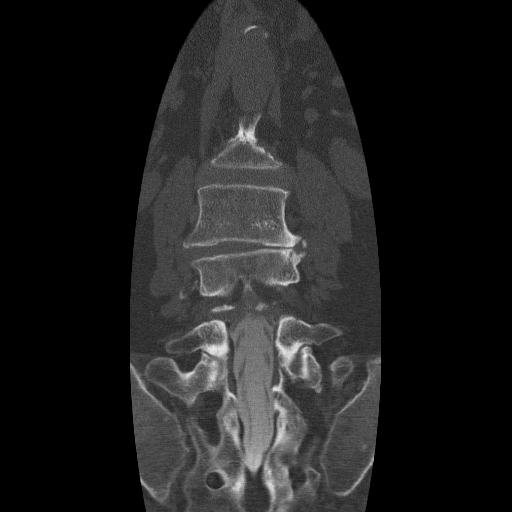

[Series 402: sag · sagittal · 0.55mm/px · 5 of 65 slices shown, 6 images]
[im 22/65  bone]
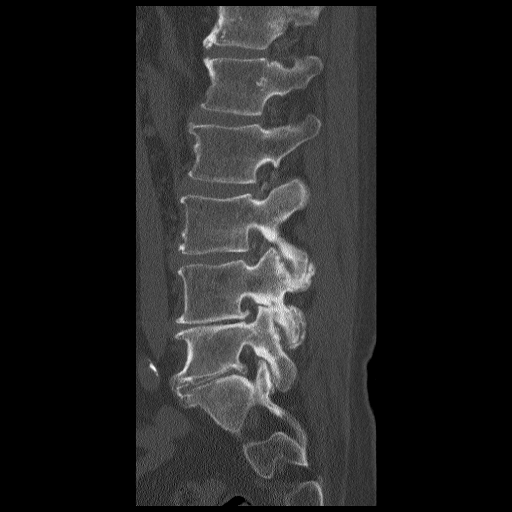
[im 27/65  bone]
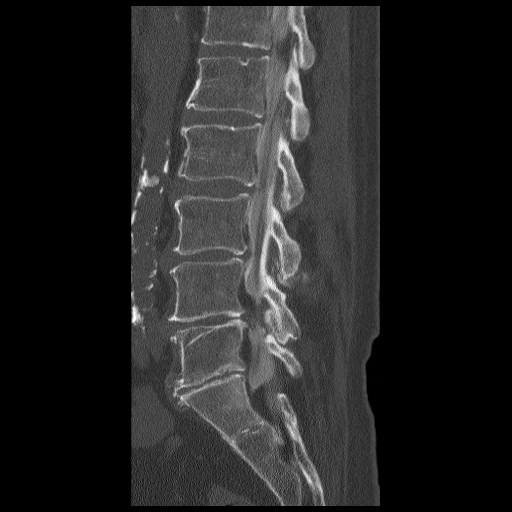
[im 33/65  soft-tissue]
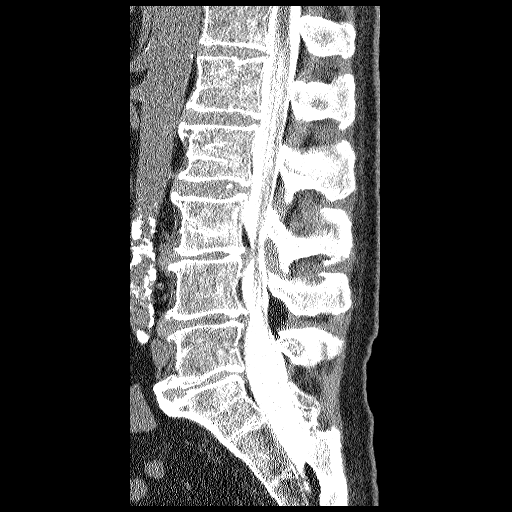
[im 33/65  bone]
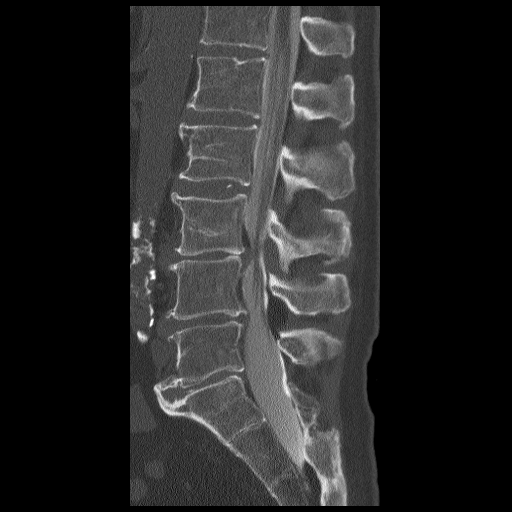
[im 38/65  bone]
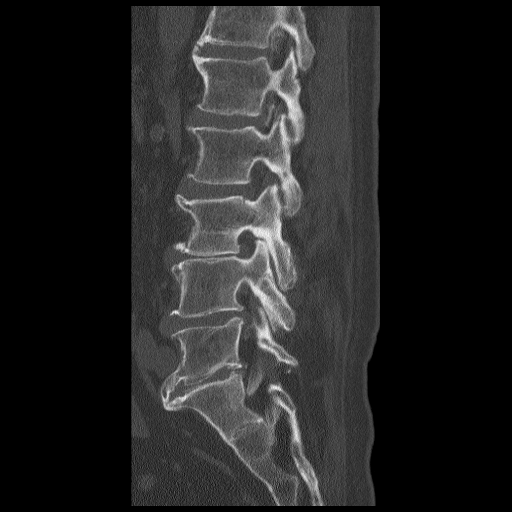
[im 43/65  bone]
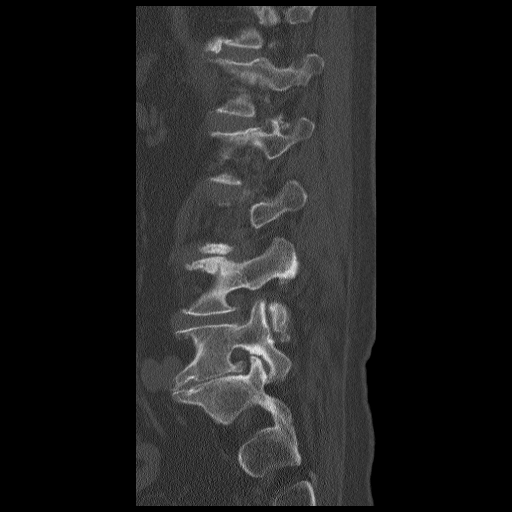

[11 of 33 positions shown; findings below may reference images not displayed]

EXAM:
LUMBAR MYELOGRAM

FLUOROSCOPY TIME:  0 min 51 seconds

PROCEDURE:
After thorough discussion of risks and benefits of the procedure
including bleeding, infection, injury to nerves, blood vessels,
adjacent structures as well as headache and CSF leak, written and
oral informed consent was obtained. Consent was obtained by Dr.
Abdullah Shiham Vernanda. Time out form was completed.

Patient was positioned prone on the fluoroscopy table. The patient
required medication with both Dilaudid and Demerol (See MAR/Nursing
Notes for dose). Positioning the patient was technically difficult.
The patient was unable to assume the supine position. We over
finally able to accommodate the patient an an oblique position with
the LEFT side up. This made the procedure technically difficult but
feasible. Local anesthesia was provided with 1% lidocaine without
epinephrine after prepped and draped in the usual sterile fashion.
Puncture was performed at L3-L4 using a 3 1/2 inch 22-gauge spinal
needle via LEFT paramedian approach. Using a single pass through the
dura, the needle was placed within the thecal sac, with return of
clear CSF. 15 mL of Umnipaque-BFS was injected into the thecal sac,
with normal opacification of the nerve roots and cauda equina
consistent with free flow within the subarachnoid space.

I personally performed the lumbar puncture and administered the
intrathecal contrast. I also personally supervised acquisition of
the myelogram images.
FINDINGS: LUMBAR MYELOGRAM FINDINGS:

In the standing upright neutral position, there is a mild
dextroconvex curve with the apex at L2-L3. Vertebral body height is
preserved. Asymmetric disc space collapse is present on the LEFT at
L3-L4. There is disc space loss at every level. grade I
retrolisthesis of L3 on L4 measures 3 mm, associated with collapse
of the disc space. Grade I anterolisthesis of L1 on L2 measures
between 1 mm and 2 mm.

Flexion extension maneuvers were performed however there is very
little range of motion in the lumbar spine and no pathologic
instability is identified. The retrolisthesis of L3 on L4 persists
unchanged between flexion, neutral and extension positions. Minimal
retrolisthesis of L1 on L2 also does not change.

CT LUMBAR MYELOGRAM FINDINGS:

There is a tiny amount of subdural contrast associated with the
difficult needle placement. Epidural contrast is also present that
extends laterally and to the paraspinal musculature.

Segmentation: 5 lumbar type vertebral bodies. The numbering
convention used for this exam termed L5-S1 as the last
intervertebral disc space.

Alignment: Unchanged mild dextroconvex curve with the apex at L3. On
CT scan, the spondylolisthesis appears negligible and cannot be
measured.

Vertebrae: Vertebral body height preserved. No destructive osseous
lesions. Scattered Schmorl's nodes.

Conus medullaris: Normal position at L1.

Paraspinal tissues: Atherosclerosis. Contrast is already being
excreted from the kidneys. Bilateral SI joint degenerative disease.

Disc levels:

T12-L1: Mild disc degeneration with posterior central annular
calcification. No stenosis.

L1-L2: Disc degeneration with mild loss of height. No stenosis.
Anterior disc bulging.

L2-L3: Mild RIGHT facet degeneration. Disc bulging anteriorly. No
stenosis.

L3-L4: Severe disc degeneration. There is a large LEFT central disc
extrusion. This extends from the RIGHT central region into the LEFT
foramen, best seen on sagittal images (image 31 series 402). Because
of the mixed injection, the disc is outlined by subdural contrast.
There appears to be a second component of the extrusion in the
lateral foraminal and extra foraminal region, probably representing
a sequestered fragment (image 52 series 3). There is moderate to
severe central stenosis which is associated with broad-based disc
bulging, facet hypertrophy and posterior ligamentum flavum
redundancy. LEFT lateral recess stenosis potentially affects the
descending LEFT L4 nerve. RIGHT lateral recess and RIGHT foramen
appear adequately patent.

L4-L5: Severe disc degeneration. Mild LEFT and moderate RIGHT
foraminal stenosis associated with facet arthrosis and endplate
spurring. Mild central stenosis is multifactorial. Severe RIGHT
facet arthrosis. Mild posterior ligamentum flavum redundancy.
Broad-based posterior disc bulging with mild central stenosis. The
lateral recesses appear adequately patent.

L5-S1: Severe disc degeneration with loss of disc height. Bridging
osteophytes are present anteriorly. No ankylosis across the disc
space. Mild RIGHT and moderate LEFT foraminal stenosis associated
with endplate spurring. The central canal and lateral recesses are
widely patent. Mild bilateral facet arthrosis.
IMPRESSION: 1. Technically successful lumbar puncture for lumbar myelogram.
2. L3-L4 large LEFT central disc extrusion extending into the LEFT
neural foramen. Probable small sequestered fragment in the lateral
aspect of the LEFT foramen extending to the LEFT extra foraminal
region. This could also represent a peripheral nerve sheath tumor
however given the nearby extrusion, disc fragment is favored.
Moderate L3-L4 central stenosis and LEFT lateral recess stenosis.
3. L4-L5 RIGHT greater than LEFT foraminal stenosis. Mild central
stenosis associated with severe RIGHT facet arthrosis, disc bulging
and posterior ligamentum flavum redundancy.
4. L5-S1 moderate LEFT and mild RIGHT foraminal stenosis associated
with severe disc degeneration.

## 2015-11-03 ENCOUNTER — Telehealth: Payer: Self-pay | Admitting: Internal Medicine

## 2015-11-03 NOTE — Telephone Encounter (Signed)
Pt needs new rx hydrocodone °

## 2015-11-04 MED ORDER — HYDROCODONE-ACETAMINOPHEN 7.5-325 MG PO TABS: 1.0000 | ORAL_TABLET | ORAL | Status: DC | PRN

## 2015-11-04 NOTE — Telephone Encounter (Signed)
Pt notified Rx ready for pickup. Rx printed and signed.  

## 2015-12-03 ENCOUNTER — Other Ambulatory Visit: Payer: Self-pay | Admitting: Internal Medicine

## 2015-12-07 ENCOUNTER — Telehealth: Payer: Self-pay | Admitting: Internal Medicine

## 2015-12-07 NOTE — Telephone Encounter (Signed)
Please call pt and schedule physical for this week, can put two slots together. No refill till seen.

## 2015-12-07 NOTE — Telephone Encounter (Signed)
Pt needs new rx hydrocodone °

## 2015-12-07 NOTE — Telephone Encounter (Signed)
Pt scheduled 12/08/15

## 2015-12-08 ENCOUNTER — Encounter: Payer: Self-pay | Admitting: Internal Medicine

## 2015-12-08 ENCOUNTER — Ambulatory Visit (INDEPENDENT_AMBULATORY_CARE_PROVIDER_SITE_OTHER): Payer: Medicare Other | Admitting: Internal Medicine

## 2015-12-08 VITALS — BP 142/76 | HR 83 | Temp 98.2°F | Resp 18 | Ht 73.5 in | Wt 170.0 lb

## 2015-12-08 DIAGNOSIS — Z789 Other specified health status: Secondary | ICD-10-CM

## 2015-12-08 DIAGNOSIS — I2510 Atherosclerotic heart disease of native coronary artery without angina pectoris: Secondary | ICD-10-CM

## 2015-12-08 DIAGNOSIS — I10 Essential (primary) hypertension: Secondary | ICD-10-CM

## 2015-12-08 DIAGNOSIS — E739 Lactose intolerance, unspecified: Secondary | ICD-10-CM | POA: Diagnosis not present

## 2015-12-08 DIAGNOSIS — E119 Type 2 diabetes mellitus without complications: Secondary | ICD-10-CM | POA: Diagnosis not present

## 2015-12-08 DIAGNOSIS — E785 Hyperlipidemia, unspecified: Secondary | ICD-10-CM | POA: Diagnosis not present

## 2015-12-08 DIAGNOSIS — Z0000 Encounter for general adult medical examination without abnormal findings: Secondary | ICD-10-CM

## 2015-12-08 DIAGNOSIS — M159 Polyosteoarthritis, unspecified: Secondary | ICD-10-CM

## 2015-12-08 DIAGNOSIS — Z889 Allergy status to unspecified drugs, medicaments and biological substances status: Secondary | ICD-10-CM

## 2015-12-08 DIAGNOSIS — M150 Primary generalized (osteo)arthritis: Secondary | ICD-10-CM

## 2015-12-08 LAB — COMPREHENSIVE METABOLIC PANEL
ALT: 26 U/L (ref 0–53)
AST: 31 U/L (ref 0–37)
Albumin: 4.6 g/dL (ref 3.5–5.2)
Alkaline Phosphatase: 46 U/L (ref 39–117)
BILIRUBIN TOTAL: 0.6 mg/dL (ref 0.2–1.2)
BUN: 11 mg/dL (ref 6–23)
CALCIUM: 9.7 mg/dL (ref 8.4–10.5)
CHLORIDE: 103 meq/L (ref 96–112)
CO2: 32 meq/L (ref 19–32)
CREATININE: 0.84 mg/dL (ref 0.40–1.50)
GFR: 94.13 mL/min (ref >60.00)
GLUCOSE: 104 mg/dL — AB (ref 70–99)
Potassium: 3.7 mEq/L (ref 3.5–5.1)
SODIUM: 140 meq/L (ref 135–145)
Total Protein: 6.9 g/dL (ref 6.0–8.3)

## 2015-12-08 LAB — CBC WITH DIFFERENTIAL/PLATELET
BASOS PCT: 0.2 % (ref 0.0–3.0)
Basophils Absolute: 0 10*3/uL (ref 0.0–0.1)
EOS ABS: 0.1 10*3/uL (ref 0.0–0.7)
Eosinophils Relative: 1.1 % (ref 0.0–5.0)
HEMATOCRIT: 43.7 % (ref 39.0–52.0)
HEMOGLOBIN: 14.4 g/dL (ref 13.0–17.0)
LYMPHS PCT: 20.5 % (ref 12.0–46.0)
Lymphs Abs: 2 10*3/uL (ref 0.7–4.0)
MCHC: 33 g/dL (ref 30.0–36.0)
MCV: 90.8 fl (ref 78.0–100.0)
MONOS PCT: 8 % (ref 3.0–12.0)
Monocytes Absolute: 0.8 10*3/uL (ref 0.1–1.0)
NEUTROS ABS: 6.8 10*3/uL (ref 1.4–7.7)
Neutrophils Relative %: 70.2 % (ref 43.0–77.0)
PLATELETS: 248 10*3/uL (ref 150.0–400.0)
RBC: 4.81 Mil/uL (ref 4.22–5.81)
RDW: 13.1 % (ref 11.5–15.5)
WBC: 9.7 10*3/uL (ref 4.0–10.5)

## 2015-12-08 LAB — TSH: TSH: 0.66 u[IU]/mL (ref 0.35–4.50)

## 2015-12-08 LAB — MICROALBUMIN / CREATININE URINE RATIO
Creatinine,U: 35.2 mg/dL
Microalb Creat Ratio: 2 mg/g (ref 0.0–30.0)
Microalb, Ur: <0.7 mg/dL (ref 0.0–1.9)

## 2015-12-08 LAB — LIPID PANEL
CHOL/HDL RATIO: 2
Cholesterol: 185 mg/dL (ref 0–200)
HDL: 74.6 mg/dL (ref >39.00)
LDL CALC: 89 mg/dL (ref 0–99)
NONHDL: 110.43
TRIGLYCERIDES: 106 mg/dL (ref 0.0–149.0)
VLDL: 21.2 mg/dL (ref 0.0–40.0)

## 2015-12-08 LAB — HEMOGLOBIN A1C: Hgb A1c MFr Bld: 6.1 % (ref 4.6–6.5)

## 2015-12-08 MED ORDER — HYDROCODONE-ACETAMINOPHEN 7.5-325 MG PO TABS: 1.0000 | ORAL_TABLET | ORAL | Status: DC | PRN

## 2015-12-08 NOTE — Progress Notes (Signed)
Pre visit review using our clinic review tool, if applicable. No additional management support is needed unless otherwise documented below in the visit note. 

## 2015-12-08 NOTE — Patient Instructions (Signed)
Limit your sodium (Salt) intake    It is important that you exercise regularly, at least 20 minutes 3 to 4 times per week.  If you develop chest pain or shortness of breath seek  medical attention.   Please check your hemoglobin A1c every 3-6  Months  Please see your eye doctor yearly to check for diabetic eye damage   

## 2015-12-08 NOTE — Progress Notes (Signed)
Subjective:    Patient ID: Christopher Dalton, male    DOB: Feb 13, 1941, 75 y.o.   MRN: [id number]  HPI   Admit date: 01/28/2014  Discharge date: 01/31/2014  Primary Diagnosis:  LUMBER SPINAL STENOSIS  Admission Diagnoses:  Past Medical History   Diagnosis  Date   .  IMPAIRED GLUCOSE TOLERANCE  03/02/2008   .  DEPRESSION  11/08/2009   .  HYPERTENSION  01/21/2007   .  C A D  11/15/2009   .  HYPOTENSION  04/13/2009   .  COPD  01/21/2007   .  INGUINAL HERNIA, RIGHT  03/25/2007   .  OSTEOARTHRITIS  01/21/2007   .  HIP PAIN, LEFT  10/29/2008   .  LOW BACK PAIN  01/21/2007   .  GERD (gastroesophageal reflux disease)    Discharge Diagnoses:  Active Problems:  Spinal stenosis, lumbar region, with neurogenic claudication  Procedure:  Procedure(s) (LRB):  Complete DECOMPRESSION lumbar laminectomy for spinal stenosis, MICRODISCECTOMY L3-4 ON LEFT for foraminotomy for L3-L4 nerve root (Left)    HPI   75  year-old patient who is seen today in for a comprehensive evaluation. Medical problems include hypertension, Type 2 diabetes, control with metformin therapy and stable COPD.   Doing well today.   Here for Medicare AWV:   1. Risk factors based on Past M, S, F history: gross factors include hypertension, and history of tobacco use.  2. Physical Activities: limited due to low back pain and left hip pain 3. Depression/mood: history depression, and insomnia for the past 6 months  4. Hearing: Moderate impairment with history of tinnitus  5. ADL's: totally independent  6. Fall Risk: minimal  7. Home Safety: no issues identified  8. Height, weight, &visual acuity: stable no difficulty with visual acuity  9. Counseling: exercise regimen discussed  10. Labs ordered based on risk factors: will check a random blood sugar. Laboratory panel was checked last fall  11. Referral Coordination- declines screening colonoscopy we'll check for FOB  12. Care Plan- will continue efforts at aggressive risk factor  modification 13. Cognitive Assessment- no problems identified. accompanied by his wife who has no concerns about cognitive dysfunction  14.  Preventive services will include annual clinical exams with screening lab.  Blood pressure will be monitored.  Stool for occult blood will be checked annually 15.  Provider's include primary care orthopedics  Allergies:  1) Amoxicillin (Amoxicillin)  2) Sulfamethoxazole (Sulfamethoxazole)   Past History:  Past Medical History:  COPD  Hypertension  Low back pain  Osteoarthritis  Allergic rhinitis  Type 2 diabetes Depression   Past Surgical History:   Tonsillectomy  Reconstructive surgery/face  status post right inguinal hernia 2008  declined screening colonoscopy  Lumbar laminectomy for spinal stenosis, July 2015  Family History:   fatherr died of cancer of unclear type  mother died of lung cancer   no siblings   Social History:   former smoker      Review of Systems  Constitutional: Negative for fever, chills, appetite change and fatigue.  HENT: Negative for congestion, dental problem, ear pain, hearing loss, sore throat, tinnitus, trouble swallowing and voice change.   Eyes: Negative for pain, discharge and visual disturbance.  Respiratory: Negative for cough, chest tightness, wheezing and stridor.   Cardiovascular: Negative for chest pain, palpitations and leg swelling.  Gastrointestinal: Negative for nausea, vomiting, abdominal pain, diarrhea, constipation, blood in stool and abdominal distention.  Genitourinary: Negative for urgency, hematuria, flank pain, discharge, difficulty  urinating and genital sores.  Musculoskeletal: Positive for gait problem. Negative for myalgias, back pain, joint swelling, arthralgias and neck stiffness.       Left hip pain  Skin: Negative for rash.  Neurological: Negative for dizziness, syncope, speech difficulty, weakness, numbness and headaches.  Hematological: Negative for adenopathy. Does  not bruise/bleed easily.  Psychiatric/Behavioral: Negative for behavioral problems and dysphoric mood. The patient is not nervous/anxious.        Objective:   Physical Exam  Constitutional: He appears well-developed and well-nourished.  HENT:  Head: Normocephalic and atraumatic.  Right Ear: External ear normal.  Left Ear: External ear normal.  Nose: Nose normal.  Mouth/Throat: Oropharynx is clear and moist.  Upper dentures in place  Eyes: Conjunctivae and EOM are normal. Pupils are equal, round, and reactive to light. No scleral icterus.  Neck: Normal range of motion. Neck supple. No JVD present. No thyromegaly present.  Cardiovascular: Regular rhythm and normal heart sounds.  Exam reveals no gallop and no friction rub.   No murmur heard. Bilateral femoral bruits Pedal pulses not easily palpable  Pulmonary/Chest: Effort normal and breath sounds normal. He exhibits no tenderness.  Abdominal: Soft. Bowel sounds are normal. He exhibits no distension and no mass. There is no tenderness.  Genitourinary: Penis normal. Guaiac negative stool.  Prostate not adequately examined due  to tight anal tone, and patient discomfort  Musculoskeletal: Normal range of motion. He exhibits no edema or tenderness.  High arches  Lymphadenopathy:    He has no cervical adenopathy.  Neurological: He is alert. He has normal reflexes. No cranial nerve deficit. Coordination normal.  Skin: Skin is warm and dry. No rash noted.   laminectomy scar in the lumbar area  Psychiatric: He has a normal mood and affect. His behavior is normal.          Assessment & Plan:   Preventive health examination Status post lumbar laminectomy for lumbar spinal stenosis Osteoarthritis Hypertension stable Diabetes mellitus type 2  We'll check a hemoglobin A1c COPD stable Coronary artery disease, stable  Heart healthy diet recommended.  Recheck in 6 months  Rogelia Boga, MD

## 2016-01-03 ENCOUNTER — Other Ambulatory Visit: Payer: Self-pay | Admitting: Internal Medicine

## 2016-01-03 NOTE — Telephone Encounter (Signed)
Okay to refill? 

## 2016-01-03 NOTE — Telephone Encounter (Signed)
Pt would like to have a refill on Atrovent HFA    Pharm:  [facility] [address]  Made pts wife aware that pt may need a appointment for this Rx.  Pt state he is not feeling well to come in this week refused [facility].

## 2016-01-03 NOTE — Telephone Encounter (Signed)
Pt requesting refill on inhaler. Please advise.

## 2016-01-04 ENCOUNTER — Encounter: Payer: Self-pay | Admitting: Family Medicine

## 2016-01-04 ENCOUNTER — Other Ambulatory Visit: Payer: Self-pay | Admitting: Internal Medicine

## 2016-01-04 ENCOUNTER — Ambulatory Visit (INDEPENDENT_AMBULATORY_CARE_PROVIDER_SITE_OTHER): Payer: Medicare Other | Admitting: Family Medicine

## 2016-01-04 VITALS — BP 144/80 | HR 90 | Temp 98.1°F | Ht 73.5 in | Wt 160.4 lb

## 2016-01-04 DIAGNOSIS — R197 Diarrhea, unspecified: Secondary | ICD-10-CM | POA: Diagnosis not present

## 2016-01-04 DIAGNOSIS — R5383 Other fatigue: Secondary | ICD-10-CM

## 2016-01-04 DIAGNOSIS — I2510 Atherosclerotic heart disease of native coronary artery without angina pectoris: Secondary | ICD-10-CM | POA: Diagnosis not present

## 2016-01-04 DIAGNOSIS — R195 Other fecal abnormalities: Secondary | ICD-10-CM

## 2016-01-04 LAB — CBC WITH DIFFERENTIAL/PLATELET
BASOS PCT: 0.4 % (ref 0.0–3.0)
Basophils Absolute: 0.1 10*3/uL (ref 0.0–0.1)
EOS ABS: 0.1 10*3/uL (ref 0.0–0.7)
EOS PCT: 0.3 % (ref 0.0–5.0)
HEMATOCRIT: 43.2 % (ref 39.0–52.0)
HEMOGLOBIN: 14.4 g/dL (ref 13.0–17.0)
LYMPHS PCT: 14.2 % (ref 12.0–46.0)
Lymphs Abs: 2.3 10*3/uL (ref 0.7–4.0)
MCHC: 33.3 g/dL (ref 30.0–36.0)
MCV: 89.9 fl (ref 78.0–100.0)
MONOS PCT: 9.7 % (ref 3.0–12.0)
Monocytes Absolute: 1.6 10*3/uL — ABNORMAL HIGH (ref 0.1–1.0)
NEUTROS ABS: 12.4 10*3/uL — AB (ref 1.4–7.7)
Neutrophils Relative %: 75.4 % (ref 43.0–77.0)
PLATELETS: 311 10*3/uL (ref 150.0–400.0)
RBC: 4.8 Mil/uL (ref 4.22–5.81)
RDW: 13.4 % (ref 11.5–15.5)
WBC: 16.4 10*3/uL — AB (ref 4.0–10.5)

## 2016-01-04 LAB — BASIC METABOLIC PANEL
BUN: 20 mg/dL (ref 6–23)
CALCIUM: 10.2 mg/dL (ref 8.4–10.5)
CO2: 34 mEq/L — ABNORMAL HIGH (ref 19–32)
CREATININE: 1.07 mg/dL (ref 0.40–1.50)
Chloride: 97 mEq/L (ref 96–112)
GFR: 71.18 mL/min (ref >60.00)
GLUCOSE: 136 mg/dL — AB (ref 70–99)
Potassium: 3.8 mEq/L (ref 3.5–5.1)
Sodium: 139 mEq/L (ref 135–145)

## 2016-01-04 NOTE — Progress Notes (Signed)
HPI:  Christopher Dalton is a pleasant 75 yo here for an acute visit for for "problems with my bowels." Initially reports that otherwise he feels "just fine." Then admits to intermittent loose bowels and diarrhea, up to a max of 4 times per day over the last 1-2 weeks. Occasional nausea. Reports he felt somewhat weak and tired yesterday when he was out at the store. Reports that his stools are dark in color. He thinks this was caused by a digital rectal exam at his recent physical. Denies taking anything for the bowel issues. Denies foul odor, mucus, vomiting, fever, headache, rash, chest pain, shortness of breath, cough, pulmonary complaints, constipation, inability to tolerate oral intake, abdominal pain, dysuria or bright red blood per rectum.  ROS: See pertinent positives and negatives per HPI.  Past Medical History  Diagnosis Date  . IMPAIRED GLUCOSE TOLERANCE 03/02/2008  . DEPRESSION 11/08/2009  . HYPERTENSION 01/21/2007  . C A D 11/15/2009  . HYPOTENSION 04/13/2009  . COPD 01/21/2007  . INGUINAL HERNIA, RIGHT 03/25/2007  . OSTEOARTHRITIS 01/21/2007  . HIP PAIN, LEFT 10/29/2008  . LOW BACK PAIN 01/21/2007  . GERD (gastroesophageal reflux disease)     Past Surgical History  Procedure Laterality Date  . Tonsillectomy  unknown  . Reconstruction mid-face    . Inguinal hernia repair  2008    right  . Fracture surgery  1958    face  . Lumbar laminectomy/decompression microdiscectomy Left 01/28/2014    Procedure: Complete DECOMPRESSION lumbar laminectomy for spinal stenosis,  MICRODISCECTOMY  L3-4 ON LEFT for foraminotomy for L3-L4 nerve root;  Surgeon: Jacki Conesonald A Gioffre, MD;  Location: [facility];  Service: Orthopedics;  Laterality: Left;    Family History  Problem Relation Age of Onset  . Cancer Mother     lung ca  . Cancer Father     unclear type    Social History   Social History  . Marital Status: Married    Spouse Name: N/A  . Number of Children: N/A  . Years of Education: N/A    Social History Main Topics  . Smoking status: Former Games developermoker  . Smokeless tobacco: Former NeurosurgeonUser  . Alcohol Use: Yes     Comment: 1-2 beers  week  . Drug Use: No  . Sexual Activity: Not Asked   Other Topics Concern  . None   Social History Narrative     Current outpatient prescriptions:  .  acetaminophen (TYLENOL) 500 MG tablet, Take 500 mg by mouth as needed., Disp: , Rfl:  .  benazepril-hydrochlorthiazide (LOTENSIN HCT) 20-12.5 MG tablet, TAKE 1 TABLET BY MOUTH DAILY., Disp: 90 tablet, Rfl: 3 .  CRANBERRY PO, Take 1 tablet by mouth daily., Disp: , Rfl:  .  diazepam (VALIUM) 5 MG tablet, TAKE 1 TABLET BY MOUTH AT BEDTIME AS NEEDED, Disp: 90 tablet, Rfl: 1 .  docusate sodium (COLACE) 100 MG capsule, Take 1 capsule (100 mg total) by mouth 2 (two) times daily as needed for mild constipation., Disp: 20 capsule, Rfl: 1 .  glucose blood (ONETOUCH VERIO) test strip, Use as instructed, Disp: 100 each, Rfl: 12 .  HYDROcodone-acetaminophen (NORCO) 7.5-325 MG tablet, Take 1 tablet by mouth every 4 (four) hours as needed for moderate pain., Disp: 120 tablet, Rfl: 0 .  loratadine (CLARITIN) 10 MG tablet, Take 10 mg by mouth daily.  , Disp: , Rfl:  .  metFORMIN (GLUCOPHAGE) 500 MG tablet, Take 1 tablet (500 mg total) by mouth 2 (two) times daily with  a meal., Disp: 180 tablet, Rfl: 2 .  Multiple Vitamin (MULTIVITAMIN WITH MINERALS) TABS tablet, Take 1 tablet by mouth daily., Disp: , Rfl:  .  naproxen sodium (ALEVE) 220 MG tablet, Take 220 mg by mouth as needed., Disp: , Rfl:  .  predniSONE (DELTASONE) 5 MG tablet, 2.5 milligrams even days of the week (Patient taking differently: Take 2.5 mg by mouth daily with breakfast. ), Disp: 90 tablet, Rfl: 1  EXAM:  Filed Vitals:   01/04/16 0914  BP: 144/80  Pulse: 90  Temp: 98.1 F (36.7 C)    Body mass index is 20.87 kg/(m^2).  GENERAL: vitals reviewed and listed above, alert, oriented, appears well hydrated and in no acute distress  HEENT:  atraumatic, conjunttiva clear, no obvious abnormalities on inspection of external nose and ears  NECK: no obvious masses on inspection  LUNGS: clear to auscultation bilaterally, no wheezes, rales or rhonchi, good air movement  CV: HRRR, no peripheral edema  ABD: BS+, soft, NTTP  RECTAL: small skin tag 11'oclock, no stool, blood or melena on exam glove, hemocult neg  MS: moves all extremities without noticeable abnormality  PSYCH: pleasant and cooperative, no obvious depression or anxiety  ASSESSMENT AND PLAN:  Discussed the following assessment and plan:  Dark stools - Plan: CBC with Differential/Platelets  Diarrhea, unspecified type - Plan: CBC with Differential/Platelets  Other fatigue - Plan: Basic metabolic panel  -we discussed possible serious and likely etiologies, workup and treatment, treatment risks and return precautions -DRE unrevealing, offered stool exam for blood, infection- he did not feel could give sample - did send home with stool cards and will get some basic labs -no dairy and imodium for possible gastroenteritis -advised to avoid aleve for now - takes rarely per his report -follow up in 1-2 weeks, sooner if needed -Patient advised to return or notify a doctor immediately if symptoms worsen or persist or new concerns arise.  Patient Instructions  Before you leave: -Schedule follow-up with your doctor in 1-2 weeks -Labs  Please complete the stool cards and return.  No dairy for 1 week.  Please in sure you are drinking plenty of fluids with electrolytes. Soup broth is a good choice.  Imodium can help with the diarrhea. This can be purchased over-the-counter.       Kriste BasqueKIM, Shikita Vaillancourt R.

## 2016-01-04 NOTE — Telephone Encounter (Signed)
Pt need new Rx for Hydrocodone °

## 2016-01-04 NOTE — Patient Instructions (Signed)
Before you leave: -Schedule follow-up with your doctor in 1-2 weeks -Labs  Please complete the stool cards and return.  No dairy for 1 week.  Please in sure you are drinking plenty of fluids with electrolytes. Soup broth is a good choice.  Imodium can help with the diarrhea. This can be purchased over-the-counter.

## 2016-01-04 NOTE — Progress Notes (Signed)
Pre visit review using our clinic review tool, if applicable. No additional management support is needed unless otherwise documented below in the visit note. 

## 2016-01-06 ENCOUNTER — Telehealth: Payer: Self-pay

## 2016-01-06 MED ORDER — HYDROCODONE-ACETAMINOPHEN 7.5-325 MG PO TABS: 1.0000 | ORAL_TABLET | ORAL | Status: DC | PRN

## 2016-01-06 NOTE — Telephone Encounter (Signed)
Spoke to pt, asked him if he needs a refill on his inhaler and which one. Pt said he is not using an inhaler and does not need one. Told him okay.

## 2016-01-06 NOTE — Telephone Encounter (Signed)
Looked in medication history and Albuterol was listed, need to clarify?

## 2016-01-06 NOTE — Telephone Encounter (Signed)
Pt notified Rx ready for pickup tomorrow. Rx printed and signed.

## 2016-01-06 NOTE — Telephone Encounter (Signed)
Called patient. Gave lab results and instructions. Patient verbalized understanding.   

## 2016-01-06 NOTE — Telephone Encounter (Signed)
-----   Message from Terressa KoyanagiHannah R Kim, DO sent at 01/05/2016 10:10 AM EDT ----- Please call today. Labs show he may be fighting infection - though appears has hx elevated wbc in the past. no anemia. If still feeling sick would advise follow up with his doctor this week for recheck/give stool sample, possibly further testing. Sooner if feeling sicker, fevers, weaker, etc.

## 2016-01-07 ENCOUNTER — Other Ambulatory Visit (INDEPENDENT_AMBULATORY_CARE_PROVIDER_SITE_OTHER): Payer: Medicare Other

## 2016-01-07 DIAGNOSIS — K921 Melena: Secondary | ICD-10-CM | POA: Diagnosis not present

## 2016-01-07 LAB — POC HEMOCCULT BLD/STL (HOME/3-CARD/SCREEN)
Card #2 Fecal Occult Blod, POC: NEGATIVE
FECAL OCCULT BLD: NEGATIVE
FECAL OCCULT BLD: NEGATIVE

## 2016-01-12 ENCOUNTER — Ambulatory Visit (INDEPENDENT_AMBULATORY_CARE_PROVIDER_SITE_OTHER): Payer: Medicare Other | Admitting: Internal Medicine

## 2016-01-12 ENCOUNTER — Encounter: Payer: Self-pay | Admitting: Internal Medicine

## 2016-01-12 VITALS — BP 132/70 | HR 84 | Temp 99.0°F | Resp 18 | Ht 73.5 in | Wt 159.0 lb

## 2016-01-12 DIAGNOSIS — I2510 Atherosclerotic heart disease of native coronary artery without angina pectoris: Secondary | ICD-10-CM

## 2016-01-12 DIAGNOSIS — E119 Type 2 diabetes mellitus without complications: Secondary | ICD-10-CM | POA: Diagnosis not present

## 2016-01-12 DIAGNOSIS — I10 Essential (primary) hypertension: Secondary | ICD-10-CM | POA: Diagnosis not present

## 2016-01-12 DIAGNOSIS — R197 Diarrhea, unspecified: Secondary | ICD-10-CM

## 2016-01-12 NOTE — Patient Instructions (Signed)
Okay to advance diet as tolerated  Limit your sodium (Salt) intake   Please check your hemoglobin A1c every 3-6  months

## 2016-01-12 NOTE — Progress Notes (Signed)
Subjective:    Patient ID: Christopher Dalton, male    DOB: Mar 07, 1941, 75 y.o.   MRN: [id number]  HPI  Lab Results  Component Value Date   HGBA1C 6.1 12/08/2015   75 year old patient who was seen last week complaining of diarrhea and dark stool.  Subsequent stool examination for occult blood was negative.  CBC revealed some leukocytosis but no anemia.  He denies any fever and states that today he feels well and his bowel habits are back to normal No new concerns or complaints  BP Readings from Last 3 Encounters:  01/12/16 132/70  01/04/16 144/80  12/08/15 142/76    Past Medical History  Diagnosis Date  . IMPAIRED GLUCOSE TOLERANCE 03/02/2008  . DEPRESSION 11/08/2009  . HYPERTENSION 01/21/2007  . C A D 11/15/2009  . HYPOTENSION 04/13/2009  . COPD 01/21/2007  . INGUINAL HERNIA, RIGHT 03/25/2007  . OSTEOARTHRITIS 01/21/2007  . HIP PAIN, LEFT 10/29/2008  . LOW BACK PAIN 01/21/2007  . GERD (gastroesophageal reflux disease)      Social History   Social History  . Marital Status: Married    Spouse Name: N/A  . Number of Children: N/A  . Years of Education: N/A   Occupational History  . Not on file.   Social History Main Topics  . Smoking status: Former Games developermoker  . Smokeless tobacco: Former NeurosurgeonUser  . Alcohol Use: Yes     Comment: 1-2 beers  week  . Drug Use: No  . Sexual Activity: Not on file   Other Topics Concern  . Not on file   Social History Narrative    Past Surgical History  Procedure Laterality Date  . Tonsillectomy  unknown  . Reconstruction mid-face    . Inguinal hernia repair  2008    right  . Fracture surgery  1958    face  . Lumbar laminectomy/decompression microdiscectomy Left 01/28/2014    Procedure: Complete DECOMPRESSION lumbar laminectomy for spinal stenosis,  MICRODISCECTOMY  L3-4 ON LEFT for foraminotomy for L3-L4 nerve root;  Surgeon: Jacki Conesonald A Gioffre, MD;  Location: [facility];  Service: Orthopedics;  Laterality: Left;    Family History  Problem  Relation Age of Onset  . Cancer Mother     lung ca  . Cancer Father     unclear type    Allergies  Allergen Reactions  . Amoxicillin     REACTION: unspecified  . Barbiturates     REACTION: tongue swells, hands swell  . Doxycycline Other (See Comments)    unknown  . Pravastatin   . Sulfamethoxazole     REACTION: unspecified  . Statins Other (See Comments)    mucsle pain    Current Outpatient Prescriptions on File Prior to Visit  Medication Sig Dispense Refill  . acetaminophen (TYLENOL) 500 MG tablet Take 500 mg by mouth as needed.    . benazepril-hydrochlorthiazide (LOTENSIN HCT) 20-12.5 MG tablet TAKE 1 TABLET BY MOUTH DAILY. 90 tablet 3  . CRANBERRY PO Take 1 tablet by mouth daily.    . diazepam (VALIUM) 5 MG tablet TAKE 1 TABLET BY MOUTH AT BEDTIME AS NEEDED 90 tablet 1  . docusate sodium (COLACE) 100 MG capsule Take 1 capsule (100 mg total) by mouth 2 (two) times daily as needed for mild constipation. 20 capsule 1  . glucose blood (ONETOUCH VERIO) test strip Use as instructed 100 each 12  . HYDROcodone-acetaminophen (NORCO) 7.5-325 MG tablet Take 1 tablet by mouth every 4 (four) hours as needed for moderate  pain. 120 tablet 0  . loratadine (CLARITIN) 10 MG tablet Take 10 mg by mouth daily.      . metFORMIN (GLUCOPHAGE) 500 MG tablet Take 1 tablet (500 mg total) by mouth 2 (two) times daily with a meal. 180 tablet 2  . Multiple Vitamin (MULTIVITAMIN WITH MINERALS) TABS tablet Take 1 tablet by mouth daily.    . naproxen sodium (ALEVE) 220 MG tablet Take 220 mg by mouth as needed.    . predniSONE (DELTASONE) 5 MG tablet 2.5 milligrams even days of the week (Patient taking differently: Take 2.5 mg by mouth daily with breakfast. ) 90 tablet 1   No current facility-administered medications on file prior to visit.    BP 132/70 mmHg  Pulse 84  Temp(Src) 99 F (37.2 C) (Oral)  Resp 18  Ht 6' 1.5" (1.867 m)  Wt 159 lb (72.122 kg)  BMI 20.69 kg/m2  SpO2 93%     Review of  Systems  Constitutional: Negative for fever, chills, appetite change and fatigue.  HENT: Negative for congestion, dental problem, ear pain, hearing loss, sore throat, tinnitus, trouble swallowing and voice change.   Eyes: Negative for pain, discharge and visual disturbance.  Respiratory: Negative for cough, chest tightness, wheezing and stridor.   Cardiovascular: Negative for chest pain, palpitations and leg swelling.  Gastrointestinal: Positive for diarrhea. Negative for nausea, vomiting, abdominal pain, constipation, blood in stool and abdominal distention.  Genitourinary: Negative for urgency, hematuria, flank pain, discharge, difficulty urinating and genital sores.  Musculoskeletal: Negative for myalgias, back pain, joint swelling, arthralgias, gait problem and neck stiffness.  Skin: Negative for rash.  Neurological: Negative for dizziness, syncope, speech difficulty, weakness, numbness and headaches.  Hematological: Negative for adenopathy. Does not bruise/bleed easily.  Psychiatric/Behavioral: Negative for behavioral problems and dysphoric mood. The patient is not nervous/anxious.        Objective:   Physical Exam  Constitutional: He is oriented to person, place, and time. He appears well-developed.  HENT:  Head: Normocephalic.  Right Ear: External ear normal.  Left Ear: External ear normal.  Eyes: Conjunctivae and EOM are normal.  Neck: Normal range of motion.  Cardiovascular: Normal rate and normal heart sounds.   Pulmonary/Chest: Breath sounds normal.  Abdominal: Soft. Bowel sounds are normal. He exhibits no distension. There is no tenderness. There is no rebound.  Musculoskeletal: Normal range of motion. He exhibits no edema or tenderness.  Neurological: He is alert and oriented to person, place, and time.  Psychiatric: He has a normal mood and affect. His behavior is normal.          Assessment & Plan:   Status post diarrhea.  Resolved No evidence of GI  bleeding Essential hypertension, stable Diabetes, well controlled on metformin therapy  Recheck 6 months or as needed

## 2016-02-04 ENCOUNTER — Emergency Department (HOSPITAL_COMMUNITY): Payer: Medicare Other

## 2016-02-04 ENCOUNTER — Emergency Department (HOSPITAL_COMMUNITY)
Admission: EM | Admit: 2016-02-04 | Discharge: 2016-02-04 | Disposition: A | Discharge status: Home / Self Care | Payer: Medicare Other | Attending: Emergency Medicine | Admitting: Emergency Medicine

## 2016-02-04 ENCOUNTER — Encounter (HOSPITAL_COMMUNITY): Payer: Self-pay

## 2016-02-04 DIAGNOSIS — W19XXXA Unspecified fall, initial encounter: Secondary | ICD-10-CM

## 2016-02-04 DIAGNOSIS — W01198A Fall on same level from slipping, tripping and stumbling with subsequent striking against other object, initial encounter: Secondary | ICD-10-CM | POA: Diagnosis not present

## 2016-02-04 DIAGNOSIS — Z7984 Long term (current) use of oral hypoglycemic drugs: Secondary | ICD-10-CM | POA: Diagnosis not present

## 2016-02-04 DIAGNOSIS — S299XXA Unspecified injury of thorax, initial encounter: Secondary | ICD-10-CM | POA: Diagnosis present

## 2016-02-04 DIAGNOSIS — E119 Type 2 diabetes mellitus without complications: Secondary | ICD-10-CM | POA: Diagnosis not present

## 2016-02-04 DIAGNOSIS — Y999 Unspecified external cause status: Secondary | ICD-10-CM | POA: Diagnosis not present

## 2016-02-04 DIAGNOSIS — I10 Essential (primary) hypertension: Secondary | ICD-10-CM | POA: Insufficient documentation

## 2016-02-04 DIAGNOSIS — Z87891 Personal history of nicotine dependence: Secondary | ICD-10-CM | POA: Insufficient documentation

## 2016-02-04 DIAGNOSIS — R55 Syncope and collapse: Secondary | ICD-10-CM | POA: Diagnosis not present

## 2016-02-04 DIAGNOSIS — S20212A Contusion of left front wall of thorax, initial encounter: Secondary | ICD-10-CM | POA: Diagnosis not present

## 2016-02-04 DIAGNOSIS — Y929 Unspecified place or not applicable: Secondary | ICD-10-CM | POA: Insufficient documentation

## 2016-02-04 DIAGNOSIS — Y9301 Activity, walking, marching and hiking: Secondary | ICD-10-CM | POA: Diagnosis not present

## 2016-02-04 DIAGNOSIS — Z79899 Other long term (current) drug therapy: Secondary | ICD-10-CM | POA: Diagnosis not present

## 2016-02-04 DIAGNOSIS — E785 Hyperlipidemia, unspecified: Secondary | ICD-10-CM | POA: Insufficient documentation

## 2016-02-04 DIAGNOSIS — I2510 Atherosclerotic heart disease of native coronary artery without angina pectoris: Secondary | ICD-10-CM | POA: Insufficient documentation

## 2016-02-04 DIAGNOSIS — J441 Chronic obstructive pulmonary disease with (acute) exacerbation: Secondary | ICD-10-CM | POA: Insufficient documentation

## 2016-02-04 LAB — CBC WITH DIFFERENTIAL/PLATELET
BASOS ABS: 0 10*3/uL (ref 0.0–0.1)
Basophils Relative: 0 %
Eosinophils Absolute: 0 10*3/uL (ref 0.0–0.7)
Eosinophils Relative: 0 %
HEMATOCRIT: 38.1 % — AB (ref 39.0–52.0)
HEMOGLOBIN: 12.6 g/dL — AB (ref 13.0–17.0)
LYMPHS PCT: 9 %
Lymphs Abs: 1.1 10*3/uL (ref 0.7–4.0)
MCH: 30.1 pg (ref 26.0–34.0)
MCHC: 33.1 g/dL (ref 30.0–36.0)
MCV: 90.9 fL (ref 78.0–100.0)
Monocytes Absolute: 0.4 10*3/uL (ref 0.1–1.0)
Monocytes Relative: 3 %
NEUTROS ABS: 10.4 10*3/uL — AB (ref 1.7–7.7)
Neutrophils Relative %: 88 %
Platelets: 231 10*3/uL (ref 150–400)
RBC: 4.19 MIL/uL — AB (ref 4.22–5.81)
RDW: 13.5 % (ref 11.5–15.5)
WBC: 11.9 10*3/uL — AB (ref 4.0–10.5)

## 2016-02-04 LAB — COMPREHENSIVE METABOLIC PANEL
ALT: 26 U/L (ref 17–63)
AST: 31 U/L (ref 15–41)
Albumin: 4.1 g/dL (ref 3.5–5.0)
Alkaline Phosphatase: 57 U/L (ref 38–126)
Anion gap: 8 (ref 5–15)
BILIRUBIN TOTAL: 0.6 mg/dL (ref 0.3–1.2)
BUN: 15 mg/dL (ref 6–20)
CHLORIDE: 103 mmol/L (ref 101–111)
CO2: 29 mmol/L (ref 22–32)
CREATININE: 0.94 mg/dL (ref 0.61–1.24)
Calcium: 9.4 mg/dL (ref 8.9–10.3)
GFR calc Af Amer: >60 mL/min (ref >60)
Glucose, Bld: 134 mg/dL — ABNORMAL HIGH (ref 65–99)
Potassium: 3.4 mmol/L — ABNORMAL LOW (ref 3.5–5.1)
Sodium: 140 mmol/L (ref 135–145)
Total Protein: 6.7 g/dL (ref 6.5–8.1)

## 2016-02-04 LAB — URINALYSIS, ROUTINE W REFLEX MICROSCOPIC
BILIRUBIN URINE: NEGATIVE
Glucose, UA: NEGATIVE mg/dL
HGB URINE DIPSTICK: NEGATIVE
Ketones, ur: NEGATIVE mg/dL
Leukocytes, UA: NEGATIVE
Nitrite: NEGATIVE
Protein, ur: NEGATIVE mg/dL
SPECIFIC GRAVITY, URINE: 1.019 (ref 1.005–1.030)
pH: 7 (ref 5.0–8.0)

## 2016-02-04 MED ORDER — NAPROXEN 375 MG PO TABS: 375.0000 mg | ORAL_TABLET | Freq: Two times a day (BID) | ORAL | 0 refills | Status: DC

## 2016-02-04 MED ORDER — OMEPRAZOLE 20 MG PO CPDR: 20.0000 mg | DELAYED_RELEASE_CAPSULE | Freq: Every day | ORAL | 0 refills | Status: DC

## 2016-02-04 MED ORDER — IOPAMIDOL (ISOVUE-300) INJECTION 61%
INTRAVENOUS | Status: AC
  Administered 2016-02-04: 75 mL
  Filled 2016-02-04: qty 75

## 2016-02-04 MED ORDER — FENTANYL CITRATE (PF) 100 MCG/2ML IJ SOLN
25.0000 ug | Freq: Once | INTRAMUSCULAR | Status: DC
  Filled 2016-02-04: qty 2

## 2016-02-04 MED ORDER — HYDROCODONE-ACETAMINOPHEN 5-325 MG PO TABS
1.0000 | ORAL_TABLET | Freq: Once | ORAL | Status: AC
  Administered 2016-02-04: 1 via ORAL
  Filled 2016-02-04: qty 1

## 2016-02-04 NOTE — ED Triage Notes (Signed)
Pt presents with L sided rib pain after falling off 3 steps on his porch x 3 days ago.  Pt reports chest wall struck the corner of a step, reports LOC.  Pt reports pain has worsened, reports shortness of breath.

## 2016-02-04 NOTE — ED Notes (Signed)
Patient getting dressed to be discharged home; brought wheelchair to bedside for patient; visitors standing at bedside

## 2016-02-04 NOTE — ED Notes (Signed)
Back from CT placed back on monitor family in room

## 2016-02-04 NOTE — ED Provider Notes (Addendum)
[facility]-EMERGENCY DEPT Provider Note   CSN: [id number] Arrival date & time: 02/04/16  0820  First Provider Contact:  None       History   Chief Complaint Chief Complaint  Patient presents with  . Fall    HPI Christopher Dalton is a 75 y.o. male.  HPI  75 year old male with past medical history of hypertension, hyperlipidemia, coronary disease, COPD who presents with mild left chest wall pain after fall from standing. The patient states that approximately 3 days ago, he was walking on his porch when he slipped and directly struck his left-sided chest against a stair. He states that he had mild pain at that time. He also may or may not have hit his head and states he may have lost consciousness. However, he quickly regained consciousness and stated up. He did not present to the ED at that time. Over the last last several days, he has had progressively worsening left chest pain. The pain is sharp, reproducible, and made worse with movement. He has mild pain with deep inspiration. Denies any substernal or other chest pain. He states his pain is not like his coronary pain. He has been taking his medications as prescribed. Regarding his LOC, he denies any headache. Denies any weakness, numbness, gait difficulty, other neurological problems.  Past Medical History:  Diagnosis Date  . C A D 11/15/2009  . COPD 01/21/2007  . DEPRESSION 11/08/2009  . GERD (gastroesophageal reflux disease)   . HIP PAIN, LEFT 10/29/2008  . HYPERTENSION 01/21/2007  . HYPOTENSION 04/13/2009  . IMPAIRED GLUCOSE TOLERANCE 03/02/2008  . INGUINAL HERNIA, RIGHT 03/25/2007  . LOW BACK PAIN 01/21/2007  . OSTEOARTHRITIS 01/21/2007    Patient Active Problem List   Diagnosis Date Noted  . Statin intolerance 12/08/2015  . Diabetes mellitus without complication (HCC) 02/23/2014  . 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 Coronary atherosclerosis 11/15/2009  . DEPRESSION 11/08/2009  .  ELECTROCARDIOGRAM, ABNORMAL 11/08/2009  . NOSEBLEED 05/17/2009  . Hypotension 04/13/2009  . SHINGLES 04/02/2009  . HIP PAIN, LEFT 10/29/2008  . INGUINAL HERNIA, RIGHT 03/25/2007  . Essential hypertension 01/21/2007  . Allergic rhinitis 01/21/2007  . COPD exacerbation (HCC) 01/21/2007  . Osteoarthritis 01/21/2007  . LOW BACK PAIN 01/21/2007    Past Surgical History:  Procedure Laterality Date  . FRACTURE SURGERY  1958   face  . INGUINAL HERNIA REPAIR  2008   right  . LUMBAR LAMINECTOMY/DECOMPRESSION MICRODISCECTOMY Left 01/28/2014   Procedure: Complete DECOMPRESSION lumbar laminectomy for spinal stenosis,  MICRODISCECTOMY  L3-4 ON LEFT for foraminotomy for L3-L4 nerve root;  Surgeon: Jacki Cones, MD;  Location: [facility];  Service: Orthopedics;  Laterality: Left;  . RECONSTRUCTION MID-FACE    . TONSILLECTOMY  unknown       Home Medications    Prior to Admission medications   Medication Sig Start Date End Date Taking? Authorizing Provider  benazepril-hydrochlorthiazide (LOTENSIN HCT) 20-12.5 MG tablet TAKE 1 TABLET BY MOUTH DAILY. 04/13/15  Yes Gordy Savers, MD  CRANBERRY PO Take 1 tablet by mouth daily.   Yes Historical Provider, MD  diazepam (VALIUM) 5 MG tablet TAKE 1 TABLET BY MOUTH AT BEDTIME AS NEEDED Patient taking differently: TAKE 1 TABLET BY MOUTH AT BEDTIME AS NEEDED sleep 12/03/15  Yes Gordy Savers, MD  docusate sodium (COLACE) 100 MG capsule Take 1 capsule (100 mg total) by mouth 2 (two) times daily as needed for mild constipation. 01/31/14  Yes Jene Every, MD  HYDROcodone-acetaminophen (  NORCO) 7.5-325 MG tablet Take 1 tablet by mouth every 4 (four) hours as needed for moderate pain. 01/06/16  Yes Gordy Savers, MD  loratadine (CLARITIN) 10 MG tablet Take 10 mg by mouth daily.     Yes Historical Provider, MD  Multiple Vitamin (MULTIVITAMIN WITH MINERALS) TABS tablet Take 1 tablet by mouth daily.   Yes Historical Provider, MD  predniSONE  (DELTASONE) 5 MG tablet 2.5 milligrams even days of the week Patient taking differently: Take 2.5 mg by mouth daily with breakfast.  11/26/14  Yes Gordy Savers, MD  acetaminophen (TYLENOL) 500 MG tablet Take 500 mg by mouth as needed for mild pain.     Historical Provider, MD  metFORMIN (GLUCOPHAGE) 500 MG tablet Take 1 tablet (500 mg total) by mouth 2 (two) times daily with a meal. 08/28/14   Gordy Savers, MD  naproxen (NAPROSYN) 375 MG tablet Take 1 tablet (375 mg total) by mouth 2 (two) times daily. 02/04/16   Shaune Pollack, MD  omeprazole (PRILOSEC) 20 MG capsule Take 1 capsule (20 mg total) by mouth daily. 02/04/16 02/14/16  Shaune Pollack, MD    Family History Family History  Problem Relation Age of Onset  . Cancer Mother     lung ca  . Cancer Father     unclear type    Social History Social History  Substance Use Topics  . Smoking status: Former Games developer  . Smokeless tobacco: Former Neurosurgeon  . Alcohol use Yes     Comment: 1-2 beers  week     Allergies   Amoxicillin; Barbiturates; Doxycycline; Pravastatin; Sulfamethoxazole; and Statins   Review of Systems Review of Systems  Constitutional: Negative for chills, fatigue and fever.  HENT: Negative for congestion and rhinorrhea.   Eyes: Negative for visual disturbance.  Respiratory: Negative for cough, chest tightness and shortness of breath.   Cardiovascular: Positive for chest pain. Negative for leg swelling.  Gastrointestinal: Negative for abdominal pain, diarrhea, nausea and vomiting.  Genitourinary: Negative for dysuria and flank pain.  Musculoskeletal: Negative for neck pain and neck stiffness.  Skin: Negative for rash and wound.  Allergic/Immunologic: Negative for immunocompromised state.  Neurological: Positive for syncope. Negative for weakness and headaches.  Hematological: Does not bruise/bleed easily.     Physical Exam Updated Vital Signs BP 144/88   Pulse 88   Temp 98.5 F (36.9 C) (Oral)    Resp 13   Ht 6\' 2"  (1.88 m)   Wt 160 lb (72.6 kg)   SpO2 96%   BMI 20.54 kg/m   Physical Exam  Constitutional: He is oriented to person, place, and time. He appears well-developed and well-nourished. No distress.  HENT:  Head: Normocephalic.  Mouth/Throat: Oropharynx is clear and moist. No oropharyngeal exudate.  Eyes: Conjunctivae are normal. Pupils are equal, round, and reactive to light.  Neck: Normal range of motion. Neck supple.  No midline TTP. ROM full and painless.  Cardiovascular: Normal rate, regular rhythm, normal heart sounds and intact distal pulses.  Exam reveals no friction rub.   No murmur heard. Pulmonary/Chest: Effort normal and breath sounds normal. No respiratory distress. He has no wheezes. He has no rales. He exhibits tenderness.    Abdominal: Soft. Bowel sounds are normal. He exhibits no distension. There is no tenderness.  Musculoskeletal: He exhibits no edema.  Neurological: He is alert and oriented to person, place, and time. He has normal strength. No cranial nerve deficit or sensory deficit. He exhibits normal muscle tone. Coordination and  gait normal. GCS eye subscore is 4. GCS verbal subscore is 5. GCS motor subscore is 6.  Skin: Skin is warm. Capillary refill takes less than 2 seconds. No rash noted.  Nursing note and vitals reviewed.    ED Treatments / Results  Labs (all labs ordered are listed, but only abnormal results are displayed) Labs Reviewed  CBC WITH DIFFERENTIAL/PLATELET - Abnormal; Notable for the following:       Result Value   WBC 11.9 (*)    RBC 4.19 (*)    Hemoglobin 12.6 (*)    HCT 38.1 (*)    Neutro Abs 10.4 (*)    All other components within normal limits  COMPREHENSIVE METABOLIC PANEL - Abnormal; Notable for the following:    Potassium 3.4 (*)    Glucose, Bld 134 (*)    All other components within normal limits  URINALYSIS, ROUTINE W REFLEX MICROSCOPIC (NOT AT [facility])  I-STAT CHEM 8, ED    EKG  EKG  Interpretation  Date/Time:  Friday February 04 2016 09:03:56 EDT Ventricular Rate:  77 PR Interval:    QRS Duration: 75 QT Interval:  348 QTC Calculation: 394 R Axis:   81 Text Interpretation:  Sinus rhythm Borderline right axis deviation Anteroseptal infarct, old When compared to prior: Non-specific change in V2 No significant change since last tracing Confirmed by Adric Wrede MD, Jessee Newnam [phone]) on 02/04/2016 10:36:14 AM       Radiology Ct Head Wo Contrast  Result Date: 02/04/2016 CLINICAL DATA:  Status post fall down 3 stairs today. EXAM: CT HEAD WITHOUT CONTRAST CT CERVICAL SPINE WITHOUT CONTRAST TECHNIQUE: Multidetector CT imaging of the head and cervical spine was performed following the standard protocol without intravenous contrast. Multiplanar CT image reconstructions of the cervical spine were also generated. COMPARISON:  None. FINDINGS: CT HEAD FINDINGS There is cortical atrophy and chronic microvascular ischemic change. No evidence of acute intracranial abnormality including hemorrhage, infarct, mass lesion, mass effect, midline shift or abnormal extra-axial fluid collection is identified. The patient has multiple remote facial bone fractures. No acute fracture is identified. Minimal mucosal thickening left maxillary sinus is noted. CT CERVICAL SPINE FINDINGS No fracture or malalignment is identified. Loss of disc space height is seen at C5-6, C6-7 and C7-T1. Scattered facet degenerative change is noted. Lung apices demonstrate scarring and emphysematous change. IMPRESSION: No acute abnormality head or cervical spine. Atrophy and chronic microvascular ischemic change. Remote facial bone fractures. Cervical spondylosis. Emphysema. Electronically Signed   By: Drusilla Kanner M.D.   On: 02/04/2016 12:02  Ct Chest W Contrast  Result Date: 02/04/2016 CLINICAL DATA:  Fall down 3 steps, left chest pain EXAM: CT CHEST WITH CONTRAST TECHNIQUE: Multidetector CT imaging of the chest was performed during  intravenous contrast administration. CONTRAST:  75mL ISOVUE-300 IOPAMIDOL (ISOVUE-300) INJECTION 61% COMPARISON:  Chest radiographs dated 02/04/2016 FINDINGS: Cardiovascular: The heart is normal in size. No pericardial effusion. Three vessel coronary atherosclerosis. Atherosclerotic calcifications of the aortic arch. Mediastinum/Nodes: No suspicious mediastinal lymphadenopathy. Visualized thyroid is unremarkable. Lungs/Pleura: 4 mm nodule in the posterior right upper lobe (series 3/image 57). 2 mm nodule in the posterior left upper lobe (series 3/image 65). Mild subpleural nodular scarring along the left fissure (series 3/image 64). Biapical pleural-parenchymal scarring. Mild centrilobular emphysematous changes. No focal consolidation. No pleural effusion or pneumothorax. Upper Abdomen: Visualized upper abdomen is notable for mild nodular thickening of the left adrenal gland. Musculoskeletal: Degenerative changes of the visualized thoracolumbar spine. No rib fracture is seen. IMPRESSION: No evidence of  traumatic injury to the chest. Specifically, no rib fracture is seen. Small bilateral upper lobe pulmonary nodules measuring up to 4 mm. Non-contrast chest CT can be considered in 12 months in this high-risk patient. This recommendation follows the consensus statement: Guidelines for Management of Incidental Pulmonary Nodules Detected on CT Images: From the Fleischner Society 2017; published online before print (10.1148/radiol.[id number]). Electronically Signed   By: Charline Bills M.D.   On: 02/04/2016 12:03  Ct Cervical Spine Wo Contrast  Result Date: 02/04/2016 CLINICAL DATA:  Status post fall down 3 stairs today. EXAM: CT HEAD WITHOUT CONTRAST CT CERVICAL SPINE WITHOUT CONTRAST TECHNIQUE: Multidetector CT imaging of the head and cervical spine was performed following the standard protocol without intravenous contrast. Multiplanar CT image reconstructions of the cervical spine were also generated.  COMPARISON:  None. FINDINGS: CT HEAD FINDINGS There is cortical atrophy and chronic microvascular ischemic change. No evidence of acute intracranial abnormality including hemorrhage, infarct, mass lesion, mass effect, midline shift or abnormal extra-axial fluid collection is identified. The patient has multiple remote facial bone fractures. No acute fracture is identified. Minimal mucosal thickening left maxillary sinus is noted. CT CERVICAL SPINE FINDINGS No fracture or malalignment is identified. Loss of disc space height is seen at C5-6, C6-7 and C7-T1. Scattered facet degenerative change is noted. Lung apices demonstrate scarring and emphysematous change. IMPRESSION: No acute abnormality head or cervical spine. Atrophy and chronic microvascular ischemic change. Remote facial bone fractures. Cervical spondylosis. Emphysema. Electronically Signed   By: Drusilla Kanner M.D.   On: 02/04/2016 12:02  Dg Chest Portable 1 View  Result Date: 02/04/2016 CLINICAL DATA:  Shortness of breath.  Status post fall 3 days ago. EXAM: PORTABLE CHEST 1 VIEW COMPARISON:  PA and lateral chest 01/22/2014 and 05/08/2007. FINDINGS: The chest is hyperexpanded with attenuation of the pulmonary vasculature but the lungs are clear. Heart size is normal. No pneumothorax or pleural effusion. Aortic atherosclerosis is identified. IMPRESSION: Emphysema without acute disease. Atherosclerosis. Electronically Signed   By: Drusilla Kanner M.D.   On: 02/04/2016 09:05   Procedures Procedures (including critical care time)  Medications Ordered in ED Medications  iopamidol (ISOVUE-300) 61 % injection (75 mLs  Contrast Given 02/04/16 1110)  HYDROcodone-acetaminophen (NORCO/VICODIN) 5-325 MG per tablet 1 tablet (1 tablet Oral Given 02/04/16 1226)     Initial Impression / Assessment and Plan / ED Course  I have reviewed the triage vital signs and the nursing notes.  Pertinent labs & imaging results that were available during my care of  the patient were reviewed by me and considered in my medical decision making (see chart for details).  Clinical Course  75 yo male with past medical history as above who presents with left chest wall pain after mechanical fall. Patient also may remain out of loss consciousness. On arrival, vital signs are stable and within normal limits. He has normal work of breathing, normal respiratory rate, and lungs are clear bilaterally. Screening chest x-ray shows no pneumothorax or displaced rib fractures. Otherwise, patient is overall very well-appearing. His neurological exam is nonfocal. His screening EKG is unremarkable and the pain began directly after falling, sharp, positional, reproducible with palpation, and I do not suspect ACS or cardiac etiology. Given his age and comorbidities, will obtain CT scan of either for underlying fracture or traumatic abnormality. Otherwise, do not suspect splenic injury given stable vitals and no abdominal pain despite 3 days of symptoms.  Labwork reviewed and is unremarkable. CBC is near baseline. Renal function is  at baseline. CMP shows no LFT abnormality. CT scan of the head, neck and chest shows no acute abnormality including no pneumothorax or displaced rib fractures. Patient is breathing comfortably, ambulatory, and satting well. He has no difficulty with incentive spirometry. Will advise supportive care at home with analgesia. Patient is already prescribed Norco by his PCP and will advise him to call his PCP for further pain management. Otherwise, strict return precautions given in detail and encourage incentive spirometry at home regularly.   Final Clinical Impressions(s) / ED Diagnoses   Final diagnoses:  Fall, initial encounter  Rib contusion, left, initial encounter    New Prescriptions Discharge Medication List as of 02/04/2016 12:38 PM    START taking these medications   Details  naproxen (NAPROSYN) 375 MG tablet Take 1 tablet (375 mg total) by mouth 2  (two) times daily., Starting Fri 02/04/2016, Print    omeprazole (PRILOSEC) 20 MG capsule Take 1 capsule (20 mg total) by mouth daily., Starting Fri 02/04/2016, Until Mon 02/14/2016, Print         Shaune Pollack, MD 02/04/16 2008    Shaune Pollack, MD 02/04/16 2008

## 2016-02-08 ENCOUNTER — Ambulatory Visit (INDEPENDENT_AMBULATORY_CARE_PROVIDER_SITE_OTHER): Payer: Medicare Other | Admitting: Internal Medicine

## 2016-02-08 ENCOUNTER — Encounter: Payer: Self-pay | Admitting: Internal Medicine

## 2016-02-08 VITALS — BP 160/70 | HR 84 | Temp 98.2°F | Ht 74.0 in | Wt 162.4 lb

## 2016-02-08 DIAGNOSIS — R0789 Other chest pain: Secondary | ICD-10-CM

## 2016-02-08 DIAGNOSIS — I2510 Atherosclerotic heart disease of native coronary artery without angina pectoris: Secondary | ICD-10-CM | POA: Diagnosis not present

## 2016-02-08 DIAGNOSIS — I10 Essential (primary) hypertension: Secondary | ICD-10-CM | POA: Diagnosis not present

## 2016-02-08 DIAGNOSIS — E119 Type 2 diabetes mellitus without complications: Secondary | ICD-10-CM

## 2016-02-08 MED ORDER — HYDROCODONE-ACETAMINOPHEN 7.5-325 MG PO TABS: 1.0000 | ORAL_TABLET | ORAL | 0 refills | Status: DC | PRN

## 2016-02-08 NOTE — Progress Notes (Signed)
Subjective:    Patient ID: Christopher Dalton, male    DOB: [birth year], 75 y.o.   MRN: [id number]  HPI  75 year old patient who is followed for hypertension, COPD and well-controlled type 2 diabetes.  He is seen today after a recent ED visit.  4 days ago. 3 days prior.  He suffered a fall resulting in trauma to the left chest wall ED records reviewed.  This included extensive radiological evaluation that included head CT and chest CT He continues to improve with the minimal left lateral and anterior chest wall pain.  Pain is aggravated by deep inspiration.  No shortness of breath  Past Medical History:  Diagnosis Date  . C A D 11/15/2009  . COPD 01/21/2007  . DEPRESSION 11/08/2009  . GERD (gastroesophageal reflux disease)   . HIP PAIN, LEFT 10/29/2008  . HYPERTENSION 01/21/2007  . HYPOTENSION 04/13/2009  . IMPAIRED GLUCOSE TOLERANCE 03/02/2008  . INGUINAL HERNIA, RIGHT 03/25/2007  . LOW BACK PAIN 01/21/2007  . OSTEOARTHRITIS 01/21/2007     Social History   Social History  . Marital status: Married    Spouse name: N/A  . Number of children: N/A  . Years of education: N/A   Occupational History  . Not on file.   Social History Main Topics  . Smoking status: Former Games developer  . Smokeless tobacco: Former Neurosurgeon  . Alcohol use Yes     Comment: 1-2 beers  week  . Drug use: No  . Sexual activity: Not on file   Other Topics Concern  . Not on file   Social History Narrative  . No narrative on file    Past Surgical History:  Procedure Laterality Date  . FRACTURE SURGERY  1958   face  . INGUINAL HERNIA REPAIR  2008   right  . LUMBAR LAMINECTOMY/DECOMPRESSION MICRODISCECTOMY Left 01/28/2014   Procedure: Complete DECOMPRESSION lumbar laminectomy for spinal stenosis,  MICRODISCECTOMY  L3-4 ON LEFT for foraminotomy for L3-L4 nerve root;  Surgeon: Jacki Cones, MD;  Location: [facility];  Service: Orthopedics;  Laterality: Left;  . RECONSTRUCTION MID-FACE    . TONSILLECTOMY  unknown     Family History  Problem Relation Age of Onset  . Cancer Mother     lung ca  . Cancer Father     unclear type    Allergies  Allergen Reactions  . Amoxicillin     REACTION: unspecified  . Barbiturates     REACTION: tongue swells, hands swell  . Doxycycline Other (See Comments)    unknown  . Pravastatin   . Sulfamethoxazole     REACTION: unspecified  . Statins Other (See Comments)    mucsle pain    Current Outpatient Prescriptions on File Prior to Visit  Medication Sig Dispense Refill  . acetaminophen (TYLENOL) 500 MG tablet Take 500 mg by mouth as needed for mild pain.     . benazepril-hydrochlorthiazide (LOTENSIN HCT) 20-12.5 MG tablet TAKE 1 TABLET BY MOUTH DAILY. 90 tablet 3  . CRANBERRY PO Take 1 tablet by mouth daily.    . diazepam (VALIUM) 5 MG tablet TAKE 1 TABLET BY MOUTH AT BEDTIME AS NEEDED (Patient taking differently: TAKE 1 TABLET BY MOUTH AT BEDTIME AS NEEDED sleep) 90 tablet 1  . docusate sodium (COLACE) 100 MG capsule Take 1 capsule (100 mg total) by mouth 2 (two) times daily as needed for mild constipation. 20 capsule 1  . loratadine (CLARITIN) 10 MG tablet Take 10 mg by mouth daily.      Marland Kitchen  metFORMIN (GLUCOPHAGE) 500 MG tablet Take 1 tablet (500 mg total) by mouth 2 (two) times daily with a meal. 180 tablet 2  . Multiple Vitamin (MULTIVITAMIN WITH MINERALS) TABS tablet Take 1 tablet by mouth daily.    . naproxen (NAPROSYN) 375 MG tablet Take 1 tablet (375 mg total) by mouth 2 (two) times daily. 20 tablet 0  . omeprazole (PRILOSEC) 20 MG capsule Take 1 capsule (20 mg total) by mouth daily. 10 capsule 0  . predniSONE (DELTASONE) 5 MG tablet 2.5 milligrams even days of the week (Patient taking differently: Take 2.5 mg by mouth daily with breakfast. ) 90 tablet 1   No current facility-administered medications on file prior to visit.     BP (!) 160/70 (BP Location: Right Arm, Patient Position: Sitting, Cuff Size: Normal)   Pulse 84   Temp 98.2 F (36.8 C)  (Oral)   Ht  (1.88 m)   Wt 162 lb 6.4 oz (73.7 kg)   SpO2 98%   BMI 20.85 kg/m     Review of Systems  Constitutional: Negative for appetite change, chills, fatigue and fever.  HENT: Negative for congestion, dental problem, ear pain, hearing loss, sore throat, tinnitus, trouble swallowing and voice change.   Eyes: Negative for pain, discharge and visual disturbance.  Respiratory: Negative for cough, chest tightness, wheezing and stridor.   Cardiovascular: Positive for chest pain. Negative for palpitations and leg swelling.  Gastrointestinal: Negative for abdominal distention, abdominal pain, blood in stool, constipation, diarrhea, nausea and vomiting.  Genitourinary: Negative for difficulty urinating, discharge, flank pain, genital sores, hematuria and urgency.  Musculoskeletal: Negative for arthralgias, back pain, gait problem, joint swelling, myalgias and neck stiffness.  Skin: Negative for rash.  Neurological: Negative for dizziness, syncope, speech difficulty, weakness, numbness and headaches.  Hematological: Negative for adenopathy. Does not bruise/bleed easily.  Psychiatric/Behavioral: Negative for behavioral problems and dysphoric mood. The patient is not nervous/anxious.        Objective:   Physical Exam  Constitutional: He is oriented to person, place, and time. He appears well-developed.  HENT:  Head: Normocephalic.  Right Ear: External ear normal.  Left Ear: External ear normal.  Eyes: Conjunctivae and EOM are normal.  Neck: Normal range of motion.  Cardiovascular: Normal rate and normal heart sounds.   Pulmonary/Chest: Breath sounds normal.  Left anterolateral chest wall tenderness area.  No ecchymoses  Breath sounds diminished but clear  Abdominal: Bowel sounds are normal.  Musculoskeletal: Normal range of motion. He exhibits no edema or tenderness.  Neurological: He is alert and oriented to person, place, and time.  Psychiatric: He has a normal mood and  affect. His behavior is normal.          Assessment & Plan:   Posttraumatic left anterolateral chest wall pain.  Continues to improve.  Will continue symptomatic treatment Essential hypertension, stable Type 2 diabetes.  Well controlled.  Last hemoglobin A1c 6.1.  Follow-up in January as scheduled COPD stable  Rogelia Boga, MD

## 2016-02-08 NOTE — Patient Instructions (Addendum)
Call or return to clinic prn if these symptoms worsen or fail to improve as anticipated.  Limit your sodium (Salt) intake   Please check your hemoglobin A1c every 6  months

## 2016-02-14 ENCOUNTER — Telehealth: Payer: Self-pay | Admitting: Internal Medicine

## 2016-02-14 MED ORDER — DIAZEPAM 5 MG PO TABS: 5.0000 mg | ORAL_TABLET | Freq: Every evening | ORAL | 0 refills | Status: DC | PRN

## 2016-02-14 NOTE — Telephone Encounter (Signed)
Refill okay?  

## 2016-02-14 NOTE — Telephone Encounter (Signed)
°  Pt req refill   diazepam (VALIUM) 5 MG tablet    Pharmacy; [facility] [address]

## 2016-02-14 NOTE — Telephone Encounter (Signed)
Rx called in 

## 2016-02-14 NOTE — Telephone Encounter (Signed)
Okay to refill? 

## 2016-03-06 ENCOUNTER — Telehealth: Payer: Self-pay | Admitting: Internal Medicine

## 2016-03-06 NOTE — Telephone Encounter (Signed)
Pt request refill  °HYDROcodone-acetaminophen (NORCO) 7.5-325 MG tablet °

## 2016-03-08 ENCOUNTER — Other Ambulatory Visit: Payer: Self-pay | Admitting: Internal Medicine

## 2016-03-10 MED ORDER — HYDROCODONE-ACETAMINOPHEN 7.5-325 MG PO TABS: 1.0000 | ORAL_TABLET | ORAL | 0 refills | Status: DC | PRN

## 2016-03-10 NOTE — Telephone Encounter (Signed)
Pt notified Rx ready for pickup. Rx printed and signed.  

## 2016-03-28 ENCOUNTER — Emergency Department (HOSPITAL_COMMUNITY): Payer: Medicare Other | Admitting: Anesthesiology

## 2016-03-28 ENCOUNTER — Encounter (HOSPITAL_COMMUNITY): Admission: EM | Disposition: A | Discharge status: Home / Self Care | Payer: Self-pay | Source: Home / Self Care

## 2016-03-28 ENCOUNTER — Telehealth: Payer: Self-pay | Admitting: Internal Medicine

## 2016-03-28 ENCOUNTER — Encounter (HOSPITAL_COMMUNITY): Payer: Self-pay

## 2016-03-28 ENCOUNTER — Inpatient Hospital Stay (HOSPITAL_COMMUNITY)
Admission: EM | Admit: 2016-03-28 | Discharge: 2016-04-04 | DRG: 330 | Disposition: A | Discharge status: Home / Self Care | Payer: Medicare Other | Attending: General Surgery | Admitting: General Surgery

## 2016-03-28 ENCOUNTER — Emergency Department (HOSPITAL_COMMUNITY): Payer: Medicare Other

## 2016-03-28 DIAGNOSIS — E860 Dehydration: Secondary | ICD-10-CM | POA: Diagnosis present

## 2016-03-28 DIAGNOSIS — M545 Low back pain: Secondary | ICD-10-CM | POA: Diagnosis present

## 2016-03-28 DIAGNOSIS — J449 Chronic obstructive pulmonary disease, unspecified: Secondary | ICD-10-CM | POA: Diagnosis present

## 2016-03-28 DIAGNOSIS — R1030 Lower abdominal pain, unspecified: Secondary | ICD-10-CM | POA: Diagnosis present

## 2016-03-28 DIAGNOSIS — E872 Acidosis, unspecified: Secondary | ICD-10-CM

## 2016-03-28 DIAGNOSIS — E876 Hypokalemia: Secondary | ICD-10-CM | POA: Diagnosis not present

## 2016-03-28 DIAGNOSIS — I2510 Atherosclerotic heart disease of native coronary artery without angina pectoris: Secondary | ICD-10-CM | POA: Diagnosis present

## 2016-03-28 DIAGNOSIS — Z6820 Body mass index (BMI) 20.0-20.9, adult: Secondary | ICD-10-CM

## 2016-03-28 DIAGNOSIS — Z87891 Personal history of nicotine dependence: Secondary | ICD-10-CM

## 2016-03-28 DIAGNOSIS — I10 Essential (primary) hypertension: Secondary | ICD-10-CM | POA: Diagnosis present

## 2016-03-28 DIAGNOSIS — K219 Gastro-esophageal reflux disease without esophagitis: Secondary | ICD-10-CM | POA: Diagnosis present

## 2016-03-28 DIAGNOSIS — Z7984 Long term (current) use of oral hypoglycemic drugs: Secondary | ICD-10-CM

## 2016-03-28 DIAGNOSIS — D72829 Elevated white blood cell count, unspecified: Secondary | ICD-10-CM

## 2016-03-28 DIAGNOSIS — K4030 Unilateral inguinal hernia, with obstruction, without gangrene, not specified as recurrent: Secondary | ICD-10-CM | POA: Diagnosis present

## 2016-03-28 DIAGNOSIS — R64 Cachexia: Secondary | ICD-10-CM | POA: Diagnosis present

## 2016-03-28 DIAGNOSIS — Z8719 Personal history of other diseases of the digestive system: Secondary | ICD-10-CM

## 2016-03-28 DIAGNOSIS — Z9889 Other specified postprocedural states: Secondary | ICD-10-CM

## 2016-03-28 DIAGNOSIS — F05 Delirium due to known physiological condition: Secondary | ICD-10-CM | POA: Diagnosis not present

## 2016-03-28 DIAGNOSIS — M1990 Unspecified osteoarthritis, unspecified site: Secondary | ICD-10-CM | POA: Diagnosis present

## 2016-03-28 DIAGNOSIS — R112 Nausea with vomiting, unspecified: Secondary | ICD-10-CM

## 2016-03-28 DIAGNOSIS — E119 Type 2 diabetes mellitus without complications: Secondary | ICD-10-CM | POA: Diagnosis present

## 2016-03-28 DIAGNOSIS — K4090 Unilateral inguinal hernia, without obstruction or gangrene, not specified as recurrent: Secondary | ICD-10-CM

## 2016-03-28 HISTORY — PX: INGUINAL HERNIA REPAIR: SUR1180

## 2016-03-28 HISTORY — PX: BOWEL RESECTION: SHX1257

## 2016-03-28 HISTORY — PX: INGUINAL HERNIA REPAIR: SHX194

## 2016-03-28 LAB — CBC WITH DIFFERENTIAL/PLATELET
BASOS ABS: 0 10*3/uL (ref 0.0–0.1)
Basophils Relative: 0 %
EOS ABS: 0 10*3/uL (ref 0.0–0.7)
EOS PCT: 0 %
HCT: 47.2 % (ref 39.0–52.0)
HEMOGLOBIN: 15.7 g/dL (ref 13.0–17.0)
LYMPHS PCT: 10 %
Lymphs Abs: 1.8 10*3/uL (ref 0.7–4.0)
MCH: 30.1 pg (ref 26.0–34.0)
MCHC: 33.3 g/dL (ref 30.0–36.0)
MCV: 90.6 fL (ref 78.0–100.0)
Monocytes Absolute: 1.7 10*3/uL — ABNORMAL HIGH (ref 0.1–1.0)
Monocytes Relative: 9 %
NEUTROS PCT: 81 %
Neutro Abs: 15.1 10*3/uL — ABNORMAL HIGH (ref 1.7–7.7)
PLATELETS: 344 10*3/uL (ref 150–400)
RBC: 5.21 MIL/uL (ref 4.22–5.81)
RDW: 13.1 % (ref 11.5–15.5)
WBC: 18.5 10*3/uL — AB (ref 4.0–10.5)

## 2016-03-28 LAB — COMPREHENSIVE METABOLIC PANEL
ALBUMIN: 4.8 g/dL (ref 3.5–5.0)
ALK PHOS: 53 U/L (ref 38–126)
ALT: 32 U/L (ref 17–63)
ANION GAP: 16 — AB (ref 5–15)
AST: 35 U/L (ref 15–41)
BUN: 20 mg/dL (ref 6–20)
CALCIUM: 10.9 mg/dL — AB (ref 8.9–10.3)
CO2: 26 mmol/L (ref 22–32)
Chloride: 96 mmol/L — ABNORMAL LOW (ref 101–111)
Creatinine, Ser: 1.33 mg/dL — ABNORMAL HIGH (ref 0.61–1.24)
GFR calc non Af Amer: 50 mL/min — ABNORMAL LOW (ref >60)
GFR, EST AFRICAN AMERICAN: 58 mL/min — AB (ref >60)
GLUCOSE: 212 mg/dL — AB (ref 65–99)
POTASSIUM: 3.9 mmol/L (ref 3.5–5.1)
SODIUM: 138 mmol/L (ref 135–145)
TOTAL PROTEIN: 7.7 g/dL (ref 6.5–8.1)
Total Bilirubin: 1 mg/dL (ref 0.3–1.2)

## 2016-03-28 LAB — I-STAT CG4 LACTIC ACID, ED: Lactic Acid, Venous: 2.94 mmol/L (ref 0.5–1.9)

## 2016-03-28 LAB — LIPASE, BLOOD: LIPASE: 18 U/L (ref 11–51)

## 2016-03-28 SURGERY — REPAIR, HERNIA, INGUINAL, LAPAROSCOPIC
Anesthesia: General | Site: Abdomen | Laterality: Right

## 2016-03-28 MED ORDER — FENTANYL CITRATE (PF) 100 MCG/2ML IJ SOLN
INTRAMUSCULAR | Status: DC | PRN
  Administered 2016-03-28 (×4): 50 ug via INTRAVENOUS

## 2016-03-28 MED ORDER — OXYCODONE-ACETAMINOPHEN 5-325 MG PO TABS
ORAL_TABLET | ORAL | Status: AC
  Filled 2016-03-28: qty 1

## 2016-03-28 MED ORDER — ONDANSETRON 4 MG PO TBDP
ORAL_TABLET | ORAL | Status: AC
  Filled 2016-03-28: qty 1

## 2016-03-28 MED ORDER — ONDANSETRON 4 MG PO TBDP: 4.0000 mg | ORAL_TABLET | Freq: Once | ORAL | Status: DC

## 2016-03-28 MED ORDER — METRONIDAZOLE IN NACL 5-0.79 MG/ML-% IV SOLN
500.0000 mg | Freq: Once | INTRAVENOUS | Status: AC
  Administered 2016-03-28: 500 mg via INTRAVENOUS
  Filled 2016-03-28: qty 100

## 2016-03-28 MED ORDER — SUGAMMADEX SODIUM 200 MG/2ML IV SOLN
INTRAVENOUS | Status: DC | PRN
  Administered 2016-03-28: 200 mg via INTRAVENOUS

## 2016-03-28 MED ORDER — SODIUM CHLORIDE 0.9 % IV BOLUS (SEPSIS)
1000.0000 mL | Freq: Once | INTRAVENOUS | Status: AC
  Administered 2016-03-28: 1000 mL via INTRAVENOUS

## 2016-03-28 MED ORDER — LACTATED RINGERS IV SOLN
INTRAVENOUS | Status: DC | PRN
  Administered 2016-03-28 (×2): via INTRAVENOUS

## 2016-03-28 MED ORDER — ONDANSETRON HCL 4 MG/2ML IJ SOLN
4.0000 mg | Freq: Once | INTRAMUSCULAR | Status: AC | PRN
  Administered 2016-03-29: 4 mg via INTRAVENOUS

## 2016-03-28 MED ORDER — OXYCODONE-ACETAMINOPHEN 5-325 MG PO TABS
1.0000 | ORAL_TABLET | Freq: Once | ORAL | Status: AC
  Administered 2016-04-01: 1 via ORAL
  Filled 2016-03-28: qty 1

## 2016-03-28 MED ORDER — ROCURONIUM BROMIDE 100 MG/10ML IV SOLN
INTRAVENOUS | Status: DC | PRN
  Administered 2016-03-28: 50 mg via INTRAVENOUS
  Administered 2016-03-28: 30 mg via INTRAVENOUS

## 2016-03-28 MED ORDER — FENTANYL CITRATE (PF) 100 MCG/2ML IJ SOLN
INTRAMUSCULAR | Status: AC
  Filled 2016-03-28: qty 2

## 2016-03-28 MED ORDER — ONDANSETRON HCL 4 MG/2ML IJ SOLN
INTRAMUSCULAR | Status: DC | PRN
  Administered 2016-03-28: 4 mg via INTRAVENOUS

## 2016-03-28 MED ORDER — 0.9 % SODIUM CHLORIDE (POUR BTL) OPTIME
TOPICAL | Status: DC | PRN
  Administered 2016-03-28: 500 mL

## 2016-03-28 MED ORDER — BUPIVACAINE HCL 0.25 % IJ SOLN
INTRAMUSCULAR | Status: DC | PRN
  Administered 2016-03-28: 7 mL

## 2016-03-28 MED ORDER — FENTANYL CITRATE (PF) 100 MCG/2ML IJ SOLN
50.0000 ug | Freq: Once | INTRAMUSCULAR | Status: AC
  Administered 2016-03-28: 50 ug via INTRAVENOUS
  Filled 2016-03-28: qty 2

## 2016-03-28 MED ORDER — PROPOFOL 10 MG/ML IV BOLUS
INTRAVENOUS | Status: AC
  Filled 2016-03-28: qty 20

## 2016-03-28 MED ORDER — DEXTROSE 5 % IV SOLN
1.0000 g | Freq: Once | INTRAVENOUS | Status: AC
  Administered 2016-03-28: 1 g via INTRAVENOUS
  Filled 2016-03-28: qty 10

## 2016-03-28 MED ORDER — PHENYLEPHRINE HCL 10 MG/ML IJ SOLN
INTRAMUSCULAR | Status: DC | PRN
  Administered 2016-03-28: 80 ug via INTRAVENOUS

## 2016-03-28 MED ORDER — SODIUM CHLORIDE 0.9 % IR SOLN: Status: DC | PRN

## 2016-03-28 MED ORDER — PROPOFOL 10 MG/ML IV BOLUS
INTRAVENOUS | Status: DC | PRN
  Administered 2016-03-28: 120 mg via INTRAVENOUS

## 2016-03-28 MED ORDER — ONDANSETRON HCL 4 MG/2ML IJ SOLN
4.0000 mg | Freq: Once | INTRAMUSCULAR | Status: AC
  Administered 2016-03-28: 4 mg via INTRAVENOUS
  Filled 2016-03-28: qty 2

## 2016-03-28 MED ORDER — BUPIVACAINE-EPINEPHRINE (PF) 0.25% -1:200000 IJ SOLN
INTRAMUSCULAR | Status: AC
  Filled 2016-03-28: qty 30

## 2016-03-28 MED ORDER — LIDOCAINE HCL (CARDIAC) 20 MG/ML IV SOLN
INTRAVENOUS | Status: DC | PRN
  Administered 2016-03-28: 60 mg via INTRAVENOUS

## 2016-03-28 MED ORDER — SUCCINYLCHOLINE CHLORIDE 20 MG/ML IJ SOLN
INTRAMUSCULAR | Status: DC | PRN
  Administered 2016-03-28: 100 mg via INTRAVENOUS

## 2016-03-28 MED ORDER — FENTANYL CITRATE (PF) 100 MCG/2ML IJ SOLN
25.0000 ug | INTRAMUSCULAR | Status: DC | PRN
Start: 2016-03-28 — End: 2016-03-29
  Administered 2016-03-29 (×3): 50 ug via INTRAVENOUS

## 2016-03-28 SURGICAL SUPPLY — 66 items
APL SKNCLS STERI-STRIP NONHPOA (GAUZE/BANDAGES/DRESSINGS) ×2
APPLIER CLIP 5 13 M/L LIGAMAX5 (MISCELLANEOUS)
APR CLP MED LRG 5 ANG JAW (MISCELLANEOUS)
BENZOIN TINCTURE PRP APPL 2/3 (GAUZE/BANDAGES/DRESSINGS) ×4 IMPLANT
CANISTER SUCTION 2500CC (MISCELLANEOUS) IMPLANT
CHLORAPREP W/TINT 26ML (MISCELLANEOUS) ×4 IMPLANT
CLIP APPLIE 5 13 M/L LIGAMAX5 (MISCELLANEOUS) IMPLANT
CLOSURE STERI-STRIP 1/2X4 (GAUZE/BANDAGES/DRESSINGS) ×1
CLOSURE WOUND 1/2 X4 (GAUZE/BANDAGES/DRESSINGS) ×1
CLSR STERI-STRIP ANTIMIC 1/2X4 (GAUZE/BANDAGES/DRESSINGS) ×1 IMPLANT
COVER SURGICAL LIGHT HANDLE (MISCELLANEOUS) ×4 IMPLANT
DISSECTOR BLUNT TIP ENDO 5MM (MISCELLANEOUS) IMPLANT
DRSG PAD ABDOMINAL 8X10 ST (GAUZE/BANDAGES/DRESSINGS) ×2 IMPLANT
ELECT REM PT RETURN 9FT ADLT (ELECTROSURGICAL) ×4
ELECTRODE REM PT RTRN 9FT ADLT (ELECTROSURGICAL) ×2 IMPLANT
ENDOLOOP SUT PDS II  0 18 (SUTURE) ×2
ENDOLOOP SUT PDS II 0 18 (SUTURE) IMPLANT
GAUZE SPONGE 2X2 8PLY STRL LF (GAUZE/BANDAGES/DRESSINGS) ×2 IMPLANT
GAUZE SPONGE 4X4 12PLY STRL (GAUZE/BANDAGES/DRESSINGS) ×2 IMPLANT
GAUZE SPONGE 4X4 16PLY XRAY LF (GAUZE/BANDAGES/DRESSINGS) ×2 IMPLANT
GLOVE BIO SURGEON STRL SZ7.5 (GLOVE) ×4 IMPLANT
GOWN STRL REUS W/ TWL LRG LVL3 (GOWN DISPOSABLE) ×4 IMPLANT
GOWN STRL REUS W/ TWL XL LVL3 (GOWN DISPOSABLE) ×2 IMPLANT
GOWN STRL REUS W/TWL LRG LVL3 (GOWN DISPOSABLE) ×8
GOWN STRL REUS W/TWL XL LVL3 (GOWN DISPOSABLE) ×4
KIT BASIN OR (CUSTOM PROCEDURE TRAY) ×4 IMPLANT
KIT ROOM TURNOVER OR (KITS) ×4 IMPLANT
MESH 3DMAX 4X6 RT LRG (Mesh General) ×2 IMPLANT
NDL INSUFFLATION 14GA 120MM (NEEDLE) IMPLANT
NEEDLE INSUFFLATION 14GA 120MM (NEEDLE) IMPLANT
NS IRRIG 1000ML POUR BTL (IV SOLUTION) ×4 IMPLANT
PAD ARMBOARD 7.5X6 YLW CONV (MISCELLANEOUS) ×8 IMPLANT
PENCIL BUTTON HOLSTER BLD 10FT (ELECTRODE) ×2 IMPLANT
RELOAD PROXIMATE 75MM BLUE (ENDOMECHANICALS) ×8 IMPLANT
RELOAD STAPLE 4.0 BLU F/HERNIA (INSTRUMENTS) IMPLANT
RELOAD STAPLE 4.8 BLK F/HERNIA (STAPLE) IMPLANT
RELOAD STAPLE 75 3.8 BLU REG (ENDOMECHANICALS) IMPLANT
RELOAD STAPLE HERNIA 4.0 BLUE (INSTRUMENTS) ×4 IMPLANT
RELOAD STAPLE HERNIA 4.8 BLK (STAPLE) IMPLANT
SCISSORS LAP 5X35 DISP (ENDOMECHANICALS) ×4 IMPLANT
SET IRRIG TUBING LAPAROSCOPIC (IRRIGATION / IRRIGATOR) IMPLANT
SET TROCAR LAP APPLE-HUNT 5MM (ENDOMECHANICALS) ×4 IMPLANT
SLEEVE ENDOPATH XCEL 5M (ENDOMECHANICALS) ×2 IMPLANT
SPONGE GAUZE 2X2 STER 10/PKG (GAUZE/BANDAGES/DRESSINGS) ×2
SPONGE GAUZE 4X4 12PLY STER LF (GAUZE/BANDAGES/DRESSINGS) ×2 IMPLANT
STAPLER HERNIA 12 8.5 360D (INSTRUMENTS) ×4 IMPLANT
STAPLER PROXIMATE 75MM BLUE (STAPLE) ×2 IMPLANT
STRIP CLOSURE SKIN 1/2X4 (GAUZE/BANDAGES/DRESSINGS) ×3 IMPLANT
SUT MNCRL AB 4-0 PS2 18 (SUTURE) ×4 IMPLANT
SUT PDS AB 2-0 CT1 27 (SUTURE) ×4 IMPLANT
SUT SILK 3 0 SH 30 (SUTURE) ×10 IMPLANT
SUT VIC AB 1 CT1 27 (SUTURE)
SUT VIC AB 1 CT1 27XBRD ANBCTR (SUTURE) IMPLANT
SUT VICRYL 0 UR6 27IN ABS (SUTURE) ×2 IMPLANT
SYRINGE TOOMEY DISP (SYRINGE) ×4 IMPLANT
TAPE CLOTH SURG 6X10 WHT LF (GAUZE/BANDAGES/DRESSINGS) ×2 IMPLANT
TOWEL OR 17X24 6PK STRL BLUE (TOWEL DISPOSABLE) ×4 IMPLANT
TOWEL OR 17X26 10 PK STRL BLUE (TOWEL DISPOSABLE) ×4 IMPLANT
TRAY FOLEY CATH SILVER 14FR (SET/KITS/TRAYS/PACK) ×4 IMPLANT
TRAY LAPAROSCOPIC MC (CUSTOM PROCEDURE TRAY) ×4 IMPLANT
TROCAR BLADELESS 5MM (ENDOMECHANICALS) ×2 IMPLANT
TROCAR XCEL 12X100 BLDLESS (ENDOMECHANICALS) ×4 IMPLANT
TUBING BULK SUCTION (MISCELLANEOUS) ×2 IMPLANT
TUBING INSUFFLATION (TUBING) ×4 IMPLANT
WATER STERILE IRR 1000ML POUR (IV SOLUTION) ×4 IMPLANT
YANKAUER SUCT BULB TIP NO VENT (SUCTIONS) ×2 IMPLANT

## 2016-03-28 NOTE — Op Note (Signed)
03/28/2016  11:54 PM  PATIENT:  Christopher Dalton  75 y.o. male  PRE-OPERATIVE DIAGNOSIS:  Incarcerated right inguinal hernia  POST-OPERATIVE DIAGNOSIS:  Incarcerated right inguinal hernia  PROCEDURE:  Procedure(s) with comments: LAPAROSCOPIC  INGUINAL HERNIA (Right) - RIGHT INGUINAL HERNIA REPAIR, TEP APPROACH EXPLORATORY LAPAROTOMY, SMALL BOWEL RESECTION WITH ANASTAMOSIS  SURGEON:  Surgeon(s) and Role:    * Axel FillerArmando Clariza Sickman, MD - Primary  ANESTHESIA:   local and general  EBL:  Total I/O In: 2000 [I.V.:1000; IV Piggyback:1000] Out: 1250 [Urine:200; Other:1000; Blood:50]  BLOOD ADMINISTERED:none  DRAINS: none   LOCAL MEDICATIONS USED:  BUPIVICAINE   SPECIMEN:  Source of Specimen:  SMALL BOWEL  DISPOSITION OF SPECIMEN:  PATHOLOGY  COUNTS:  YES  TOURNIQUET:  * No tourniquets in log *  DICTATION: .Dragon Dictation   Counts: reported as correct x 2  Indications for procedure:  The patient is a 75 year old male with a right recurrent inguinal hernia for several months. Patient complained of symptomatology to his right inguinal area starting tonight. The patient was taken back for emergent inguinal hernia repair.  Details of the procedure: The patient was taken back to the operating room. The patient was placed in supine position with bilateral SCDs in place.  The patient was prepped and draped in the usual sterile fashion.  After appropriate anitbiotics were confirmed, a time-out was confirmed and all facts were verified.  0.25% Marcaine was used to infiltrate the umbilical area. A 11-blade was used to cut down the skin and blunt dissection was used to get the anterior fashion.  The anterior fascia was incised approximately 1 cm and the muscles were retracted laterally. Blunt dissection was then used to create a space in the preperitoneal area. At this time a 10 mm camera was then introduced into the space and advanced the pubic tubercle and a 12 mm trocar was placed over this and  insufflation was started.  At this time and space was created from medial to laterally the preperitoneal space.  Cooper's ligament was initially cleaned off.  The hernia sac was identified incarcerated in the femoral space. There was a tear in the peritoneum. The bowel could be seen in this window. It appeared bruised. The peritoneum was then ligated with an Endoloop 1. Dissection of the spermatic cord was undertaken the vas deferens was identified and protected in all parts of the case. There is no indirect or direct hernia seen on dissection.  Once the hernia sac was taken down to approximately the umbilicus a Bard 3D Max mesh, size: Large, was  introduced into the preperitoneal space.  The mesh was brought over to cover the direct,, and femoral indirect hernia spaces.  This was anchored into place and secured to Cooper's ligament with 4.40mm staples from a [place] hernia stapler. It was anchored to the anterior abdominal wall with 4.8 mm staples. The hernia sac was seen lying posterior to the mesh. There was no staples placed laterally. The insufflation was evacuated and the peritoneum was seen posterior to the mesh. The trochars were removed. The anterior fascia was reapproximated using #1 Vicryl on a UR- 6.    I was concerned for the status of the small bowel. At this time a 5 mm trocar and camera were then placed in the left paramedian area at the level of the umbilicus. Most also placed on the right paramedian area. Insufflation was begun. The camera was introduced. There is no injury to any intra-abdominal organs. I visualized the bowel intra-abdominally.  The bowel appeared significantly bruised. There is no gross perforation or rales concern for the significance of the bruising causing delayed perforation.  At this time a left paramedian paramedian incision was made. A left paramedian incision was chosen to avoid the previous dissection plane of the right preperitoneal hernia repair. Dissection was  taken to the anterior fascia of the rectus. This was incised. The muscles were retracted laterally and medially. The peritoneum was raised in sharply entered. At this time the bowel was brought up through the paramedian incision. Towels were placed around the wound to protect the wound. At this time a mesenteric window was made proximal and distal to the small bowel. A 75 GIA stapler was used to transect the bowel. The mesentery was ligated with Kelly's and 2-0 silk's. This was sent off to pathology. At this time the corner of the staple line was transected. The bowels entered. There was some spillage of enteric contents. 75 GI stapler was used to create a common enterotomy. The staple lines were offset. The enterotomy was then closed using a number firing the 75 GIA stapler. An apex stitch was placed using 3-0 silk. The anastomosis appeared patent on palpation.  At this time the posterior fascia was reapproximated using a running 0 Vicryl. The anterior fascia was then reapproximated using a 2-0 running PDS times one. The wound was left open. This packed with 4 x 4's and tape. Steri-Strips were placed over the 5 mm trocar sites.  The patient tolerated the procedure well was taken to the recovery room in stable condition.   Intra-abdominal air was evacuated and the Veress needle removed. The skin was reapproximated using 4-0 Monocryl subcuticular fashion the patient was awakened from general anesthesia and taken to recovery in stable condition.   PLAN OF CARE: Admit to inpatient   PATIENT DISPOSITION:  PACU - hemodynamically stable.   Delay start of Pharmacological VTE agent (>24hrs) due to surgical blood loss or risk of bleeding: not applicable

## 2016-03-28 NOTE — Telephone Encounter (Signed)
TELEPHONE ADVICE RECORD [facility]  Patient Name: Christopher Dalton  DOB: 07/04/1941    Initial Comment Caller states husband is vomiting and is not able to eat and is in pain.    Nurse Assessment      Guidelines    Guideline Title Affirmed Question Affirmed Notes       Final Disposition User   FINAL ATTEMPT MADE - no message left Odis LusterBowers, Charity fundraiserN, Bjorn Loserhonda

## 2016-03-28 NOTE — Transfer of Care (Signed)
Immediate Anesthesia Transfer of Care Note  Patient: Christopher Dalton  Procedure(s) Performed: Procedure(s) with comments: LAPAROSCOPIC  INGUINAL HERNIA (Right) - RIGHT INGUINAL HERNIA REPAIR   Patient Location: PACU  Anesthesia Type:General  Level of Consciousness: awake, alert  and patient cooperative  Airway & Oxygen Therapy: Patient Spontanous Breathing  Post-op Assessment: Report given to RN and Post -op Vital signs reviewed and stable  Post vital signs: Reviewed and stable  Last Vitals:  Vitals:   03/28/16 1915 03/28/16 1945  BP: 169/81 148/73  Pulse: 97 97  Resp: 21 19  Temp:      Last Pain:  Vitals:   03/28/16 1950  TempSrc:   PainSc: 4          Complications: No apparent anesthesia complications

## 2016-03-28 NOTE — ED Triage Notes (Signed)
Patient complains of groin pain increased x 2 days. States that he has bilateral inguinal hernias and increased pain with vomiting that started yesterday. Alert and oriented.

## 2016-03-28 NOTE — Anesthesia Preprocedure Evaluation (Signed)
Anesthesia Evaluation  Patient identified by MRN, date of birth, ID band Patient awake    Reviewed: Allergy & Precautions, H&P , NPO status , Patient's Chart, lab work & pertinent test results  Airway Mallampati: II  TM Distance: >3 FB Neck ROM: Full    Dental no notable dental hx.    Pulmonary COPD, former smoker,    Pulmonary exam normal breath sounds clear to auscultation       Cardiovascular hypertension, Pt. on medications + CAD  Normal cardiovascular exam Rhythm:Regular Rate:Normal     Neuro/Psych PSYCHIATRIC DISORDERS Depression negative neurological ROS     GI/Hepatic Neg liver ROS, GERD  ,  Endo/Other  diabetes  Renal/GU negative Renal ROS     Musculoskeletal  (+) Arthritis ,   Abdominal   Peds  Hematology negative hematology ROS (+)   Anesthesia Other Findings   Reproductive/Obstetrics                             Anesthesia Physical  Anesthesia Plan  ASA: III  Anesthesia Plan: General   Post-op Pain Management:    Induction: Intravenous and Rapid sequence  Airway Management Planned: Oral ETT  Additional Equipment:   Intra-op Plan:   Post-operative Plan: Extubation in OR  Informed Consent: I have reviewed the patients History and Physical, chart, labs and discussed the procedure including the risks, benefits and alternatives for the proposed anesthesia with the patient or authorized representative who has indicated his/her understanding and acceptance.   Dental advisory given  Plan Discussed with: CRNA  Anesthesia Plan Comments:         Anesthesia Quick Evaluation

## 2016-03-28 NOTE — H&P (Signed)
Christopher Dalton is an 75 y.o. male.   Chief Complaint: Right inguinal pain HPI: Patient is a 75 year old male comes in with a chief complaint of right inguinal pain. He states he's had pain to the right inguinal area as well as a bulge for the last 2-3 days. He states this was associated with nausea and vomiting. He states this does not radiate to his abdominal region. Patient does state he has been using a truss secondary to a hernia that had been "popping out in the past." Patient does state he had a previous right inguinal hernia repair, open in the past. He is unsure of the date. Per the records as appears to be 2008.  Past Medical History:  Diagnosis Date  . C A D 11/15/2009  . COPD 01/21/2007  . DEPRESSION 11/08/2009  . GERD (gastroesophageal reflux disease)   . HIP PAIN, LEFT 10/29/2008  . HYPERTENSION 01/21/2007  . HYPOTENSION 04/13/2009  . IMPAIRED GLUCOSE TOLERANCE 03/02/2008  . INGUINAL HERNIA, RIGHT 03/25/2007  . LOW BACK PAIN 01/21/2007  . OSTEOARTHRITIS 01/21/2007    Past Surgical History:  Procedure Laterality Date  . [place]   face  . INGUINAL HERNIA REPAIR  2008   right  . LUMBAR LAMINECTOMY/DECOMPRESSION MICRODISCECTOMY Left 01/28/2014   Procedure: Complete DECOMPRESSION lumbar laminectomy for spinal stenosis,  MICRODISCECTOMY  L3-4 ON LEFT for foraminotomy for L3-L4 nerve root;  Surgeon: Tobi Bastos, MD;  Location: [facility];  Service: Orthopedics;  Laterality: Left;  . RECONSTRUCTION MID-FACE    . TONSILLECTOMY  unknown    Family History  Problem Relation Age of Onset  . Cancer Mother     lung ca  . Cancer Father     unclear type   Social History:  reports that he has quit smoking. He has quit using smokeless tobacco. He reports that he drinks alcohol. He reports that he does not use drugs.  Allergies:  Allergies  Allergen Reactions  . Amoxicillin Other (See Comments)  . Barbiturates Swelling    tongue and hands swell  . Doxycycline Other (See  Comments)    unknown  . Penicillins Itching and Swelling  . Pravastatin   . Sulfamethoxazole     REACTION: unspecified  . Statins Other (See Comments)    mucsle pain     (Not in a hospital admission)  Results for orders placed or performed during the hospital encounter of 03/28/16 (from the past 48 hour(s))  Comprehensive metabolic panel     Status: Abnormal   Collection Time: 03/28/16  4:41 PM  Result Value Ref Range   Sodium 138 135 - 145 mmol/L   Potassium 3.9 3.5 - 5.1 mmol/L   Chloride 96 (L) 101 - 111 mmol/L   CO2 26 22 - 32 mmol/L   Glucose, Bld 212 (H) 65 - 99 mg/dL   BUN 20 6 - 20 mg/dL   Creatinine, Ser 1.33 (H) 0.61 - 1.24 mg/dL   Calcium 10.9 (H) 8.9 - 10.3 mg/dL   Total Protein 7.7 6.5 - 8.1 g/dL   Albumin 4.8 3.5 - 5.0 g/dL   AST 35 15 - 41 U/L   ALT 32 17 - 63 U/L   Alkaline Phosphatase 53 38 - 126 U/L   Total Bilirubin 1.0 0.3 - 1.2 mg/dL   GFR calc non Af Amer 50 (L) >60 mL/min   GFR calc Af Amer 58 (L) >60 mL/min    Comment: (NOTE) The eGFR has been calculated using the  CKD EPI equation. This calculation has not been validated in all clinical situations. eGFR's persistently <60 mL/min signify possible Chronic Kidney Disease.    Anion gap 16 (H) 5 - 15  CBC with Differential     Status: Abnormal   Collection Time: 03/28/16  4:41 PM  Result Value Ref Range   WBC 18.5 (H) 4.0 - 10.5 K/uL   RBC 5.21 4.22 - 5.81 MIL/uL   Hemoglobin 15.7 13.0 - 17.0 g/dL   HCT 47.2 39.0 - 52.0 %   MCV 90.6 78.0 - 100.0 fL   MCH 30.1 26.0 - 34.0 pg   MCHC 33.3 30.0 - 36.0 g/dL   RDW 13.1 11.5 - 15.5 %   Platelets 344 150 - 400 K/uL   Neutrophils Relative % 81 %   Neutro Abs 15.1 (H) 1.7 - 7.7 K/uL   Lymphocytes Relative 10 %   Lymphs Abs 1.8 0.7 - 4.0 K/uL   Monocytes Relative 9 %   Monocytes Absolute 1.7 (H) 0.1 - 1.0 K/uL   Eosinophils Relative 0 %   Eosinophils Absolute 0.0 0.0 - 0.7 K/uL   Basophils Relative 0 %   Basophils Absolute 0.0 0.0 - 0.1 K/uL   Lipase, blood     Status: None   Collection Time: 03/28/16  4:41 PM  Result Value Ref Range   Lipase 18 11 - 51 U/L  I-Stat CG4 Lactic Acid, ED     Status: Abnormal   Collection Time: 03/28/16  5:13 PM  Result Value Ref Range   Lactic Acid, Venous 2.94 (HH) 0.5 - 1.9 mmol/L   Comment NOTIFIED PHYSICIAN    No results found.  Review of Systems  Constitutional: Negative for chills, fever, malaise/fatigue and weight loss.  HENT: Negative for ear discharge, ear pain, hearing loss, nosebleeds and tinnitus.   Eyes: Negative for blurred vision, double vision, photophobia and pain.  Respiratory: Negative for cough, hemoptysis, sputum production and shortness of breath.   Cardiovascular: Negative for chest pain, palpitations, orthopnea and claudication.  Gastrointestinal: Positive for abdominal pain, nausea and vomiting. Negative for blood in stool, constipation, diarrhea and heartburn.  Genitourinary: Negative for dysuria, hematuria and urgency.  Skin: Negative for itching and rash.  Neurological: Negative for dizziness, tingling, tremors and headaches.  Endo/Heme/Allergies: Negative for environmental allergies. Does not bruise/bleed easily.  Psychiatric/Behavioral: Negative for depression, hallucinations, substance abuse and suicidal ideas.    Blood pressure 169/81, pulse 97, temperature 98.6 F (37 C), temperature source Oral, resp. rate 21, SpO2 90 %. Physical Exam  Constitutional: He appears well-developed and well-nourished. No distress.  HENT:  Head: Normocephalic and atraumatic.  Eyes: Conjunctivae and EOM are normal. Pupils are equal, round, and reactive to light.  Neck: Normal range of motion. Neck supple. No tracheal deviation present. No thyromegaly present.  Cardiovascular: Normal rate, regular rhythm and normal heart sounds.  Exam reveals no gallop and no friction rub.   No murmur heard. Respiratory: Effort normal and breath sounds normal. No respiratory distress. He has no  wheezes. He has no rales. He exhibits no tenderness.  GI: Soft. Bowel sounds are normal. He exhibits no distension. There is tenderness. There is no rebound and no guarding. A hernia is present. Hernia confirmed positive in the right inguinal area (incarcerated).  Genitourinary: Penis normal.  Musculoskeletal: Normal range of motion. He exhibits no edema, tenderness or deformity.  Neurological: He is alert.  Skin: Skin is warm and dry. He is not diaphoretic.     Assessment/Plan 75 year old male  with a incarcerated right likely femoral hernia.  1. We will proceed to the operating for emergent laparoscopic versus open right hernia repair with mesh 2. All risks and benefits were discussed with the patient, to generally include infection, bleeding, damage to surrounding structures, acute and chronic nerve pain, and recurrence. Alternatives were offered and described.  All questions were answered and the patient voiced understanding of the procedure and wishes to proceed at this point.   Reyes Ivan, MD 03/28/2016, 7:40 PM

## 2016-03-28 NOTE — Anesthesia Procedure Notes (Signed)
Procedure Name: Intubation Date/Time: 03/28/2016 8:19 PM Performed by: Arlice ColtMANESS, Lam Bjorklund B Pre-anesthesia Checklist: Patient identified, Emergency Drugs available, Suction available, Patient being monitored and Timeout performed Patient Re-evaluated:Patient Re-evaluated prior to inductionOxygen Delivery Method: Circle system utilized Preoxygenation: Pre-oxygenation with 100% oxygen Intubation Type: IV induction, Rapid sequence and Cricoid Pressure applied Laryngoscope Size: Mac and 3 Grade View: Grade I Tube type: Oral Tube size: 7.5 mm Number of attempts: 1 Airway Equipment and Method: Stylet Placement Confirmation: ETT inserted through vocal cords under direct vision,  positive ETCO2 and breath sounds checked- equal and bilateral Secured at: 23 cm Tube secured with: Tape Dental Injury: Teeth and Oropharynx as per pre-operative assessment

## 2016-03-28 NOTE — ED Provider Notes (Signed)
[facility]-EMERGENCY DEPT Provider Note   CSN: [id number] Arrival date & time: 03/28/16  1620     History   Chief Complaint Chief Complaint  Patient presents with  . hernia pain    HPI Christopher Dalton is a 75 y.o. male.  HPI   3 days bilateral groin pain, nausea/vomiting for 3 days, passing flatus for last few days.   Reports vomiting 30-40 times today. Less flatus today.  No urinary symptoms.  No fevers  Cough x1 week. Pain worse with coughing. Pain constant and severe more than 10/10  Pt denies hx of hernia repair or abdominal surgery although this is listed in chart.   Past Medical History:  Diagnosis Date  . C A D 11/15/2009  . COPD 01/21/2007  . DEPRESSION 11/08/2009  . GERD (gastroesophageal reflux disease)   . HIP PAIN, LEFT 10/29/2008  . HYPERTENSION 01/21/2007  . HYPOTENSION 04/13/2009  . IMPAIRED GLUCOSE TOLERANCE 03/02/2008  . INGUINAL HERNIA, RIGHT 03/25/2007  . LOW BACK PAIN 01/21/2007  . OSTEOARTHRITIS 01/21/2007    Patient Active Problem List   Diagnosis Date Noted  . S/P hernia repair 03/29/2016  . Hernia, inguinal, right 03/28/2016  . Statin intolerance 12/08/2015  . Diabetes mellitus without complication (HCC) 02/23/2014  . 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 Coronary atherosclerosis 11/15/2009  . DEPRESSION 11/08/2009  . ELECTROCARDIOGRAM, ABNORMAL 11/08/2009  . NOSEBLEED 05/17/2009  . Hypotension 04/13/2009  . SHINGLES 04/02/2009  . HIP PAIN, LEFT 10/29/2008  . INGUINAL HERNIA, RIGHT 03/25/2007  . Essential hypertension 01/21/2007  . Allergic rhinitis 01/21/2007  . COPD exacerbation (HCC) 01/21/2007  . Osteoarthritis 01/21/2007  . LOW BACK PAIN 01/21/2007    Past Surgical History:  Procedure Laterality Date  . FRACTURE SURGERY  1958   face  . INGUINAL HERNIA REPAIR  2008   right  . LUMBAR LAMINECTOMY/DECOMPRESSION MICRODISCECTOMY Left 01/28/2014   Procedure: Complete DECOMPRESSION lumbar laminectomy for  spinal stenosis,  MICRODISCECTOMY  L3-4 ON LEFT for foraminotomy for L3-L4 nerve root;  Surgeon: Jacki Conesonald A Gioffre, MD;  Location: [facility];  Service: Orthopedics;  Laterality: Left;  . RECONSTRUCTION MID-FACE    . TONSILLECTOMY  unknown       Home Medications    Prior to Admission medications   Medication Sig Start Date End Date Taking? Authorizing Provider  benazepril-hydrochlorthiazide (LOTENSIN HCT) 20-12.5 MG tablet TAKE 1 TABLET BY MOUTH DAILY. 04/13/15  Yes Gordy SaversPeter F Kwiatkowski, MD  diazepam (VALIUM) 5 MG tablet Take 1 tablet (5 mg total) by mouth at bedtime as needed. Patient taking differently: Take 5 mg by mouth at bedtime as needed for anxiety.  02/14/16  Yes Gordy SaversPeter F Kwiatkowski, MD  HYDROcodone-acetaminophen (NORCO) 7.5-325 MG tablet Take 1 tablet by mouth every 4 (four) hours as needed for moderate pain. 03/10/16  Yes Gordy SaversPeter F Kwiatkowski, MD  metFORMIN (GLUCOPHAGE) 500 MG tablet TAKE 1 TABLET (500 MG TOTAL) BY MOUTH 2 (TWO) TIMES DAILY WITH A MEAL. 03/08/16  Yes Gordy SaversPeter F Kwiatkowski, MD  predniSONE (DELTASONE) 5 MG tablet 2.5 milligrams even days of the week Patient taking differently: Take 2.5-5 mg by mouth See admin instructions. Take 2.5 mg by mouth on even days and take 5 mg by mouth on odd days. 11/26/14  Yes Gordy SaversPeter F Kwiatkowski, MD  acetaminophen (TYLENOL) 500 MG tablet Take 500 mg by mouth as needed for mild pain.     Historical Provider, MD  CRANBERRY PO Take 1 tablet by mouth daily.    Historical  Provider, MD  docusate sodium (COLACE) 100 MG capsule Take 1 capsule (100 mg total) by mouth 2 (two) times daily as needed for mild constipation. 01/31/14   Jene Every, MD  loratadine (CLARITIN) 10 MG tablet Take 10 mg by mouth daily.      Historical Provider, MD  Multiple Vitamin (MULTIVITAMIN WITH MINERALS) TABS tablet Take 1 tablet by mouth daily.    Historical Provider, MD  naproxen (NAPROSYN) 375 MG tablet Take 1 tablet (375 mg total) by mouth 2 (two) times daily. Patient not  taking: Reported on 03/29/2016 02/04/16   Shaune Pollack, MD  omeprazole (PRILOSEC) 20 MG capsule Take 1 capsule (20 mg total) by mouth daily. Patient not taking: Reported on 03/29/2016 02/04/16 03/29/16  Shaune Pollack, MD    Family History Family History  Problem Relation Age of Onset  . Cancer Mother     lung ca  . Cancer Father     unclear type    Social History Social History  Substance Use Topics  . Smoking status: Former Games developer  . Smokeless tobacco: Former Neurosurgeon  . Alcohol use Yes     Comment: 1-2 beers  week     Allergies   Barbiturates; Amoxicillin; Doxycycline; Penicillins; Pravastatin; Statins; and Sulfamethoxazole   Review of Systems Review of Systems  Constitutional: Negative for fever.  HENT: Negative for sore throat.   Eyes: Negative for visual disturbance.  Respiratory: Positive for cough. Negative for shortness of breath.   Cardiovascular: Negative for chest pain.  Gastrointestinal: Positive for abdominal pain, nausea and vomiting. Negative for constipation and diarrhea.  Genitourinary: Negative for difficulty urinating and dysuria.  Musculoskeletal: Negative for back pain and neck stiffness.  Skin: Negative for rash.  Neurological: Negative for syncope and headaches.     Physical Exam Updated Vital Signs BP 110/60 (BP Location: Right Arm)   Pulse 92   Temp 98.8 F (37.1 C) (Oral)   Resp 18   Ht 6\' 3"  (1.905 m)   Wt 165 lb 9.1 oz (75.1 kg)   SpO2 98%   BMI 20.69 kg/m   Physical Exam  Constitutional: He is oriented to person, place, and time. He appears well-developed and well-nourished. No distress.  HENT:  Head: Normocephalic and atraumatic.  Eyes: Conjunctivae and EOM are normal.  Neck: Normal range of motion.  Cardiovascular: Normal rate, regular rhythm, normal heart sounds and intact distal pulses.  Exam reveals no gallop and no friction rub.   No murmur heard. Pulmonary/Chest: Effort normal and breath sounds normal. No respiratory  distress. He has no wheezes. He has no rales.  Abdominal: Soft. He exhibits no distension. There is tenderness. There is guarding. There is no CVA tenderness and negative Murphy's sign. A hernia is present. Hernia confirmed positive in the right inguinal area. Hernia confirmed negative in the left inguinal area.  R inguinal hernia unable to reduce, severe tenderness over area Do not palpate L inguinal hernia. Pt with bilateral abd tenderness   Musculoskeletal: He exhibits no edema.  Neurological: He is alert and oriented to person, place, and time.  Skin: Skin is warm and dry. He is not diaphoretic.  Nursing note and vitals reviewed.    ED Treatments / Results  Labs (all labs ordered are listed, but only abnormal results are displayed) Labs Reviewed  COMPREHENSIVE METABOLIC PANEL - Abnormal; Notable for the following:       Result Value   Chloride 96 (*)    Glucose, Bld 212 (*)    Creatinine,  Ser 1.33 (*)    Calcium 10.9 (*)    GFR calc non Af Amer 50 (*)    GFR calc Af Amer 58 (*)    Anion gap 16 (*)    All other components within normal limits  CBC WITH DIFFERENTIAL/PLATELET - Abnormal; Notable for the following:    WBC 18.5 (*)    Neutro Abs 15.1 (*)    Monocytes Absolute 1.7 (*)    All other components within normal limits  I-STAT CG4 LACTIC ACID, ED - Abnormal; Notable for the following:    Lactic Acid, Venous 2.94 (*)    All other components within normal limits  CULTURE, BLOOD (ROUTINE X 2)  CULTURE, BLOOD (ROUTINE X 2)  LIPASE, BLOOD  URINALYSIS, ROUTINE W REFLEX MICROSCOPIC (NOT AT [facility])  I-STAT CG4 LACTIC ACID, ED  SURGICAL PATHOLOGY    EKG  EKG Interpretation None       Radiology Dg Chest Portable 1 View  Result Date: 03/28/2016 CLINICAL DATA:  75 year old male with cough for 1 week. Initial encounter. COPD. EXAM: PORTABLE CHEST 1 VIEW COMPARISON:  Chest CT 02/04/2016 and earlier. FINDINGS: Portable AP semi upright view at 1938 hours.Chronic pulmonary  hyperinflation. Mediastinal contours remain normal. Incidental left nipple shadow is stable. No pneumothorax, pulmonary edema, pleural effusion or acute pulmonary opacity. Calcified aortic atherosclerosis. IMPRESSION: 1. Chronic pulmonary hyperinflation. No superimposed acute findings are identified. 2. Calcified aortic atherosclerosis. Electronically Signed   By: Odessa Fleming M.D.   On: 03/28/2016 20:09    Procedures Procedures (including critical care time)  Medications Ordered in ED Medications  ondansetron (ZOFRAN-ODT) disintegrating tablet 4 mg ( Oral MAR Unhold 03/29/16 0317)  oxyCODONE-acetaminophen (PERCOCET/ROXICET) 5-325 MG per tablet 1 tablet ( Oral MAR Unhold 03/29/16 0317)  predniSONE (DELTASONE) tablet 2.5 mg (2.5 mg Oral Given 03/29/16 0918)  enoxaparin (LOVENOX) injection 40 mg (not administered)  dextrose 5 %-0.9 % sodium chloride infusion ( Intravenous New Bag/Given 03/29/16 0336)  ertapenem (INVANZ) 1 g in sodium chloride 0.9 % 50 mL IVPB (1 g Intravenous Given 03/29/16 0351)  HYDROmorphone (DILAUDID) injection 1 mg (1 mg Intravenous Given 03/29/16 0952)  ondansetron (ZOFRAN-ODT) disintegrating tablet 4 mg (not administered)    Or  ondansetron (ZOFRAN) injection 4 mg (not administered)  hydrALAZINE (APRESOLINE) injection 10 mg (not administered)  fentaNYL (SUBLIMAZE) 100 MCG/2ML injection (not administered)  fentaNYL (SUBLIMAZE) 100 MCG/2ML injection (not administered)  ondansetron (ZOFRAN) 4 MG/2ML injection (not administered)  benazepril (LOTENSIN) tablet 20 mg (20 mg Oral Given 03/29/16 0919)    And  hydrochlorothiazide (MICROZIDE) capsule 12.5 mg (12.5 mg Oral Given 03/29/16 0919)  metoprolol (LOPRESSOR) injection 5 mg (not administered)  ondansetron (ZOFRAN) injection 4 mg (4 mg Intravenous Given 03/28/16 1820)  cefTRIAXone (ROCEPHIN) 1 g in dextrose 5 % 50 mL IVPB (1 g Intravenous New Bag/Given 03/28/16 1950)  metroNIDAZOLE (FLAGYL) IVPB 500 mg (500 mg Intravenous Given 03/28/16  2025)  sodium chloride 0.9 % bolus 1,000 mL (1,000 mLs Intravenous New Bag/Given 03/28/16 1950)  sodium chloride 0.9 % bolus 1,000 mL (0 mLs Intravenous Stopped 03/28/16 2002)  fentaNYL (SUBLIMAZE) injection 50 mcg (50 mcg Intravenous Given 03/28/16 1912)  ondansetron (ZOFRAN) injection 4 mg (4 mg Intravenous Given 03/29/16 1610)     Initial Impression / Assessment and Plan / ED Course  I have reviewed the triage vital signs and the nursing notes.  Pertinent labs & imaging results that were available during my care of the patient were reviewed by me and considered in my  medical decision making (see chart for details).  Clinical Course   75yo male with history of DM, htn, possible COPD, inguinal hernia (with history of repair per records although pt denies) presents with bilateral groin pain, nausea, and vomiting.    Patient with incarcerated right hernia on exam. Do not detect incarcerated left hernia.  Given abdominal tenderness, ordered CT and consulted General Surgery. Dr Derrell Lolling came to bedside and was unable to reduce hernia and will take patient to OR for repair. CT canceled given clinical picture, surgical evaluation consistent with incarcerated hernia.   Patient with leukocytosis and lactic acidosis, likely in setting of stress from incarcerated hernia, question strangulation, as well as dehydration.  Overall, low suspicion for sepsis as etiology, however did order IV fluids, blood cx and rocephin/flagyl for possible sepsis from intraabdominal source.  XR ordered given cough for one week without pneumonia.  Patient taken to OR for emergent inguinal hernia repair.  Final Clinical Impressions(s) / ED Diagnoses   Final diagnoses:  Incarcerated inguinal hernia  Lactic acidosis  Dehydration  Leukocytosis  Nausea and vomiting, vomiting of unspecified type    New Prescriptions Current Discharge Medication List       Alvira Monday, MD 03/29/16 1130

## 2016-03-29 ENCOUNTER — Ambulatory Visit: Payer: Medicare Other | Admitting: Internal Medicine

## 2016-03-29 ENCOUNTER — Encounter (HOSPITAL_COMMUNITY): Payer: Self-pay | Admitting: General Surgery

## 2016-03-29 DIAGNOSIS — Z8719 Personal history of other diseases of the digestive system: Secondary | ICD-10-CM

## 2016-03-29 DIAGNOSIS — Z9889 Other specified postprocedural states: Secondary | ICD-10-CM

## 2016-03-29 MED ORDER — FENTANYL CITRATE (PF) 100 MCG/2ML IJ SOLN
INTRAMUSCULAR | Status: AC
  Filled 2016-03-29: qty 2

## 2016-03-29 MED ORDER — BENAZEPRIL HCL 20 MG PO TABS
20.0000 mg | ORAL_TABLET | Freq: Every day | ORAL | Status: DC
  Administered 2016-03-29 – 2016-04-04 (×6): 20 mg via ORAL
  Filled 2016-03-29 (×7): qty 1

## 2016-03-29 MED ORDER — ONDANSETRON HCL 4 MG/2ML IJ SOLN
4.0000 mg | Freq: Four times a day (QID) | INTRAMUSCULAR | Status: DC | PRN
  Administered 2016-04-02: 4 mg via INTRAVENOUS
  Filled 2016-03-29: qty 2

## 2016-03-29 MED ORDER — BENAZEPRIL-HYDROCHLOROTHIAZIDE 20-12.5 MG PO TABS: 1.0000 | ORAL_TABLET | Freq: Every day | ORAL | Status: DC

## 2016-03-29 MED ORDER — DEXTROSE-NACL 5-0.9 % IV SOLN
INTRAVENOUS | Status: DC
  Administered 2016-03-29 – 2016-04-01 (×6): via INTRAVENOUS

## 2016-03-29 MED ORDER — ONDANSETRON HCL 4 MG/2ML IJ SOLN
INTRAMUSCULAR | Status: AC
  Filled 2016-03-29: qty 2

## 2016-03-29 MED ORDER — METOPROLOL TARTRATE 5 MG/5ML IV SOLN: 5.0000 mg | Freq: Four times a day (QID) | INTRAVENOUS | Status: DC | PRN

## 2016-03-29 MED ORDER — HYDRALAZINE HCL 20 MG/ML IJ SOLN: 10.0000 mg | INTRAMUSCULAR | Status: DC | PRN

## 2016-03-29 MED ORDER — ENOXAPARIN SODIUM 40 MG/0.4ML ~~LOC~~ SOLN
40.0000 mg | SUBCUTANEOUS | Status: DC
  Administered 2016-03-30 – 2016-04-04 (×5): 40 mg via SUBCUTANEOUS
  Filled 2016-03-29 (×7): qty 0.4

## 2016-03-29 MED ORDER — PREDNISONE 5 MG PO TABS
2.5000 mg | ORAL_TABLET | Freq: Every day | ORAL | Status: DC
  Administered 2016-03-29 – 2016-04-04 (×6): 2.5 mg via ORAL
  Filled 2016-03-29 (×7): qty 1

## 2016-03-29 MED ORDER — HYDROMORPHONE HCL 1 MG/ML IJ SOLN
1.0000 mg | INTRAMUSCULAR | Status: DC | PRN
  Administered 2016-03-29 – 2016-04-03 (×29): 1 mg via INTRAVENOUS
  Filled 2016-03-29 (×29): qty 1

## 2016-03-29 MED ORDER — SODIUM CHLORIDE 0.9 % IV SOLN
1.0000 g | INTRAVENOUS | Status: AC
  Administered 2016-03-29 – 2016-03-30 (×2): 1 g via INTRAVENOUS
  Filled 2016-03-29 (×2): qty 1

## 2016-03-29 MED ORDER — HYDROCHLOROTHIAZIDE 12.5 MG PO CAPS
12.5000 mg | ORAL_CAPSULE | Freq: Every day | ORAL | Status: DC
  Administered 2016-03-29 – 2016-04-04 (×6): 12.5 mg via ORAL
  Filled 2016-03-29 (×7): qty 1

## 2016-03-29 MED ORDER — HALOPERIDOL LACTATE 5 MG/ML IJ SOLN
5.0000 mg | Freq: Four times a day (QID) | INTRAMUSCULAR | Status: DC | PRN
  Administered 2016-03-29 – 2016-04-03 (×6): 5 mg via INTRAVENOUS
  Filled 2016-03-29 (×6): qty 1

## 2016-03-29 MED ORDER — ONDANSETRON 4 MG PO TBDP: 4.0000 mg | ORAL_TABLET | Freq: Four times a day (QID) | ORAL | Status: DC | PRN

## 2016-03-29 NOTE — Telephone Encounter (Signed)
Pt is in the hospital per Dr.K. Went in last evening.

## 2016-03-29 NOTE — Progress Notes (Signed)
[facility] Progress Note  1 Day Post-Op  Subjective: Abdominal pain rated 12/10. Denies nausea/vomiting. States he does not want an NGT b/c he has a PMH of a broken nose. Urinating. Pulling 2,500 on IS.  Objective: Vital signs in last 24 hours: Temp:  [97.2 F (36.2 C)-100.3 F (37.9 C)] 99 F (37.2 C) (09/20 0651) Pulse Rate:  [92-121] 95 (09/20 0651) Resp:  [15-23] 18 (09/20 0333) BP: (102-178)/(30-146) 110/62 (09/20 0651) SpO2:  [90 %-100 %] 98 % (09/20 0651) Weight:  [75.1 kg (165 lb 9.1 oz)] 75.1 kg (165 lb 9.1 oz) (09/20 0333)    Intake/Output from previous day: 09/19 0701 - 09/20 0700 In: 2390 [I.V.:1340; IV Piggyback:1050] Out: 1550 [Urine:500; Blood:50] Intake/Output this shift: No intake/output data recorded.  PE: Gen:  Alert, cachectic male in NAD, pleasant and cooperative Card:  RRR, no M/G/R heard Pulm:  CTABL Abd: Soft, appropriately tender, ND, limited BS, incisions with dressing in place, dried blood on bandage, no surrounding erythema or active drainage. Ext:  No erythema, edema, or tenderness   Lab Results:   Recent Labs  03/28/16 1641  WBC 18.5*  HGB 15.7  HCT 47.2  PLT 344   BMET  Recent Labs  03/28/16 1641  NA 138  K 3.9  CL 96*  CO2 26  GLUCOSE 212*  BUN 20  CREATININE 1.33*  CALCIUM 10.9*   PT/INR No results for input(s): LABPROT, INR in the last 72 hours. CMP     Component Value Date/Time   NA 138 03/28/2016 1641   K 3.9 03/28/2016 1641   CL 96 (L) 03/28/2016 1641   CO2 26 03/28/2016 1641   GLUCOSE 212 (H) 03/28/2016 1641   BUN 20 03/28/2016 1641   CREATININE 1.33 (H) 03/28/2016 1641   CALCIUM 10.9 (H) 03/28/2016 1641   PROT 7.7 03/28/2016 1641   ALBUMIN 4.8 03/28/2016 1641   AST 35 03/28/2016 1641   ALT 32 03/28/2016 1641   ALKPHOS 53 03/28/2016 1641   BILITOT 1.0 03/28/2016 1641   GFRNONAA 50 (L) 03/28/2016 1641   GFRAA 58 (L) 03/28/2016 1641   Lipase     Component Value Date/Time   LIPASE 18  03/28/2016 1641   Studies/Results: Dg Chest Portable 1 View  Result Date: 03/28/2016 CLINICAL DATA:  75 year old male with cough for 1 week. Initial encounter. COPD. EXAM: PORTABLE CHEST 1 VIEW COMPARISON:  Chest CT 02/04/2016 and earlier. FINDINGS: Portable AP semi upright view at 1938 hours.Chronic pulmonary hyperinflation. Mediastinal contours remain normal. Incidental left nipple shadow is stable. No pneumothorax, pulmonary edema, pleural effusion or acute pulmonary opacity. Calcified aortic atherosclerosis. IMPRESSION: 1. Chronic pulmonary hyperinflation. No superimposed acute findings are identified. 2. Calcified aortic atherosclerosis. Electronically Signed   By: Odessa Fleming M.D.   On: 03/28/2016 20:09    Anti-infectives: Anti-infectives    Start     Dose/Rate Route Frequency Ordered Stop   03/29/16 0400  ertapenem (INVANZ) 1 g in sodium chloride 0.9 % 50 mL IVPB     1 g 100 mL/hr over 30 Minutes Intravenous Every 24 hours 03/29/16 0330 03/31/16 0359   03/28/16 1900  cefTRIAXone (ROCEPHIN) 1 g in dextrose 5 % 50 mL IVPB     1 g 100 mL/hr over 30 Minutes Intravenous  Once 03/28/16 1854 03/28/16 2020   03/28/16 1900  metroNIDAZOLE (FLAGYL) IVPB 500 mg     500 mg 100 mL/hr over 60 Minutes Intravenous  Once 03/28/16 1854 03/28/16 2025     Assessment/Plan  Incarcerated right inguinal hernia S/p LAPAROSCOPIC  INGUINAL HERNIA (Right) - RIGHT INGUINAL HERNIA REPAIR, TEP APPROACH EXPLORATORY LAPAROTOMY, SMALL BOWEL RESECTION WITH ANASTAMOSIS - Dr. Derrell Lollingamirez, 03/28/2016 - patient is refusing NGT, needs to remain NPO 24-48 h - BID wet-to-dry dressing changes today - pain control, IS  -ambulate   FEN: NPO, IVF ID: ertapenem [facility](INVANZ) DVT Proph: SCD's, lovenox  Dispo: bowel rest, pain control , start dressing changes    LOS: 1 day    Adam PhenixElizabeth S Lanyah Spengler , [facility] 03/29/2016, 8:29 AM Pager: [phone] Consults: [phone] Mon-Fri 7:00 am-4:30 pm Sat-Sun 7:00  am-11:30 am

## 2016-03-29 NOTE — Telephone Encounter (Signed)
Attempted to contact patient yesterday (03/28/16) and also this morning (03/29/16). No voicemail set up. Will continue to attempt contacting patient to follow up on status.

## 2016-03-29 NOTE — Anesthesia Postprocedure Evaluation (Signed)
Anesthesia Post Note  Patient: Vear Clocklton E Mcswain  Procedure(s) Performed: Procedure(s) (LRB): LAPAROSCOPIC  INGUINAL HERNIA (Right) SMALL BOWEL RESECTION  Patient location during evaluation: PACU Anesthesia Type: General Level of consciousness: awake and alert Pain management: pain level controlled Vital Signs Assessment: post-procedure vital signs reviewed and stable Respiratory status: spontaneous breathing, nonlabored ventilation, respiratory function stable and patient connected to nasal cannula oxygen Cardiovascular status: blood pressure returned to baseline and stable Postop Assessment: no signs of nausea or vomiting Anesthetic complications: no    Last Vitals:  Vitals:   03/29/16 0333 03/29/16 0651  BP: 123/64 110/62  Pulse: (!) 103 95  Resp: 18   Temp: 37.9 C 37.2 C    Last Pain:  Vitals:   03/29/16 0655  TempSrc:   PainSc: 8                  Reino KentJudd, Finnley Lewis J

## 2016-03-29 NOTE — Progress Notes (Signed)
NG tube was placed per MD verbal order at bedside.  Pt immediately pulled it out and said he was not having that.  MD was paged and aware that pt is refusing NG tube.

## 2016-03-29 NOTE — Progress Notes (Signed)
Received patient from PACU  Patient alert but disoriented to time.  VS stable but PR is tachy at 103.  O2Sat at 99% on 1L/min via NS and pain at 10/10 due to recent bed transfer.  Patient oriented to room, bed controls and call light.  Administered PRN Dilaudid 1 mg IV per order and patient resting on bed comfortably after voiding on urinal.

## 2016-03-30 ENCOUNTER — Encounter (HOSPITAL_COMMUNITY): Payer: Self-pay | Admitting: General Practice

## 2016-03-30 LAB — BASIC METABOLIC PANEL
Anion gap: 7 (ref 5–15)
BUN: 21 mg/dL — AB (ref 6–20)
CHLORIDE: 104 mmol/L (ref 101–111)
CO2: 30 mmol/L (ref 22–32)
Calcium: 8.5 mg/dL — ABNORMAL LOW (ref 8.9–10.3)
Creatinine, Ser: 0.86 mg/dL (ref 0.61–1.24)
GFR calc Af Amer: >60 mL/min (ref >60)
GLUCOSE: 125 mg/dL — AB (ref 65–99)
POTASSIUM: 3.1 mmol/L — AB (ref 3.5–5.1)
Sodium: 141 mmol/L (ref 135–145)

## 2016-03-30 LAB — CBC
HEMATOCRIT: 35.6 % — AB (ref 39.0–52.0)
HEMOGLOBIN: 11.5 g/dL — AB (ref 13.0–17.0)
MCH: 29.6 pg (ref 26.0–34.0)
MCHC: 32.3 g/dL (ref 30.0–36.0)
MCV: 91.8 fL (ref 78.0–100.0)
PLATELETS: 214 10*3/uL (ref 150–400)
RBC: 3.88 MIL/uL — AB (ref 4.22–5.81)
RDW: 13.8 % (ref 11.5–15.5)
WBC: 12.9 10*3/uL — ABNORMAL HIGH (ref 4.0–10.5)

## 2016-03-30 MED ORDER — ORAL CARE MOUTH RINSE
15.0000 mL | Freq: Two times a day (BID) | OROMUCOSAL | Status: DC
  Administered 2016-03-30 – 2016-04-03 (×7): 15 mL via OROMUCOSAL

## 2016-03-30 NOTE — Progress Notes (Signed)
2 Days Post-Op  Subjective: Complains of incisional pain, mild nausea  Objective: Vital signs in last 24 hours: Temp:  [98 F (36.7 C)-100.3 F (37.9 C)] 98 F (36.7 C) (09/21 0210) Pulse Rate:  [86-100] 86 (09/21 0210) Resp:  [16-18] 16 (09/21 0210) BP: (110-129)/(54-69) 110/54 (09/21 0210) SpO2:  [95 %-98 %] 95 % (09/21 0210) Last BM Date:  (PTA)  Intake/Output from previous day: 09/20 0701 - 09/21 0700 In: 1750 [I.V.:1700; IV Piggyback:50] Out: 700 [Urine:700] Intake/Output this shift: No intake/output data recorded.  Exam: Awake and alert Abdomen soft, non-distended with +BS Lungs clear  Lab Results:   Recent Labs  03/28/16 1641 03/30/16 0353  WBC 18.5* 12.9*  HGB 15.7 11.5*  HCT 47.2 35.6*  PLT 344 214   BMET  Recent Labs  03/28/16 1641 03/30/16 0353  NA 138 141  K 3.9 3.1*  CL 96* 104  CO2 26 30  GLUCOSE 212* 125*  BUN 20 21*  CREATININE 1.33* 0.86  CALCIUM 10.9* 8.5*   PT/INR No results for input(s): LABPROT, INR in the last 72 hours. ABG No results for input(s): PHART, HCO3 in the last 72 hours.  Invalid input(s): PCO2, PO2  Studies/Results: Dg Chest Portable 1 View  Result Date: 03/28/2016 CLINICAL DATA:  75 year old male with cough for 1 week. Initial encounter. COPD. EXAM: PORTABLE CHEST 1 VIEW COMPARISON:  Chest CT 02/04/2016 and earlier. FINDINGS: Portable AP semi upright view at 1938 hours.Chronic pulmonary hyperinflation. Mediastinal contours remain normal. Incidental left nipple shadow is stable. No pneumothorax, pulmonary edema, pleural effusion or acute pulmonary opacity. Calcified aortic atherosclerosis. IMPRESSION: 1. Chronic pulmonary hyperinflation. No superimposed acute findings are identified. 2. Calcified aortic atherosclerosis. Electronically Signed   By: Odessa FlemingH  Hall M.D.   On: 03/28/2016 20:09    Anti-infectives: Anti-infectives    Start     Dose/Rate Route Frequency Ordered Stop   03/29/16 0400  ertapenem (INVANZ) 1 g in  sodium chloride 0.9 % 50 mL IVPB     1 g 100 mL/hr over 30 Minutes Intravenous Every 24 hours 03/29/16 0330 03/30/16 0334   03/28/16 1900  cefTRIAXone (ROCEPHIN) 1 g in dextrose 5 % 50 mL IVPB     1 g 100 mL/hr over 30 Minutes Intravenous  Once 03/28/16 1854 03/28/16 2020   03/28/16 1900  metroNIDAZOLE (FLAGYL) IVPB 500 mg     500 mg 100 mL/hr over 60 Minutes Intravenous  Once 03/28/16 1854 03/28/16 2025      Assessment/Plan: s/p Procedure(s) with comments: LAPAROSCOPIC  INGUINAL HERNIA (Right) - RIGHT INGUINAL HERNIA REPAIR  SMALL BOWEL RESECTION  Will let him have ice chips and sips of water today   LOS: 2 days    Jumanah Hynson A 03/30/2016

## 2016-03-31 LAB — BASIC METABOLIC PANEL WITH GFR
Anion gap: 8 (ref 5–15)
BUN: 7 mg/dL (ref 6–20)
CO2: 31 mmol/L (ref 22–32)
Calcium: 8.4 mg/dL — ABNORMAL LOW (ref 8.9–10.3)
Chloride: 100 mmol/L — ABNORMAL LOW (ref 101–111)
Creatinine, Ser: 0.74 mg/dL (ref 0.61–1.24)
GFR calc Af Amer: >60 mL/min
GFR calc non Af Amer: >60 mL/min
Glucose, Bld: 152 mg/dL — ABNORMAL HIGH (ref 65–99)
Potassium: 2.9 mmol/L — ABNORMAL LOW (ref 3.5–5.1)
Sodium: 139 mmol/L (ref 135–145)

## 2016-03-31 LAB — BASIC METABOLIC PANEL
Anion gap: 7 (ref 5–15)
BUN: 6 mg/dL (ref 6–20)
CALCIUM: 8.3 mg/dL — AB (ref 8.9–10.3)
CO2: 29 mmol/L (ref 22–32)
CREATININE: 0.71 mg/dL (ref 0.61–1.24)
Chloride: 104 mmol/L (ref 101–111)
GFR calc Af Amer: >60 mL/min (ref >60)
GLUCOSE: 158 mg/dL — AB (ref 65–99)
POTASSIUM: 3.1 mmol/L — AB (ref 3.5–5.1)
SODIUM: 140 mmol/L (ref 135–145)

## 2016-03-31 LAB — CBC
HCT: 35.9 % — ABNORMAL LOW (ref 39.0–52.0)
Hemoglobin: 11.5 g/dL — ABNORMAL LOW (ref 13.0–17.0)
MCH: 29.4 pg (ref 26.0–34.0)
MCHC: 32 g/dL (ref 30.0–36.0)
MCV: 91.8 fL (ref 78.0–100.0)
Platelets: 210 K/uL (ref 150–400)
RBC: 3.91 MIL/uL — ABNORMAL LOW (ref 4.22–5.81)
RDW: 13.3 % (ref 11.5–15.5)
WBC: 10.4 K/uL (ref 4.0–10.5)

## 2016-03-31 MED ORDER — POTASSIUM CHLORIDE 10 MEQ/100ML IV SOLN
10.0000 meq | INTRAVENOUS | Status: AC
  Administered 2016-03-31 (×4): 10 meq via INTRAVENOUS
  Filled 2016-03-31: qty 100

## 2016-03-31 MED ORDER — HALOPERIDOL LACTATE 5 MG/ML IJ SOLN
5.0000 mg | Freq: Once | INTRAMUSCULAR | Status: AC
  Administered 2016-03-31: 5 mg via INTRAVENOUS

## 2016-03-31 MED ORDER — POTASSIUM CHLORIDE 10 MEQ/100ML IV SOLN
10.0000 meq | INTRAVENOUS | Status: AC
  Administered 2016-03-31 (×4): 10 meq via INTRAVENOUS
  Filled 2016-03-31 (×2): qty 100

## 2016-03-31 NOTE — Progress Notes (Signed)
CRITICAL VALUE ALERT  Critical value received:K+ 2.9  Date of notification:  03/31/16  Time of notification: 0630  Critical value read back: yes  Nurse who received alert:  L. Sherlyn HayAbiera  **MD notified (1st page):Dr Andrey CampanileWilson  Time of first page :[phone]  MD notified (2nd page):  Time of second page:  Responding MD: Dr Andrey CampanileWilson  Time MD responded: [phone]

## 2016-03-31 NOTE — Care Management Important Message (Signed)
Important Message  Patient Details  Name: Vear Clocklton E Ennis MRN: [id number] Date of Birth: [birth year]   Medicare Important Message Given:  Yes    Shatoya Roets Stefan ChurchBratton 03/31/2016, 1:49 PM
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
## 2016-04-01 LAB — URINALYSIS, ROUTINE W REFLEX MICROSCOPIC
BILIRUBIN URINE: NEGATIVE
GLUCOSE, UA: 100 mg/dL — AB
HGB URINE DIPSTICK: NEGATIVE
KETONES UR: NEGATIVE mg/dL
Leukocytes, UA: NEGATIVE
Nitrite: NEGATIVE
PROTEIN: NEGATIVE mg/dL
Specific Gravity, Urine: 1.009 (ref 1.005–1.030)
pH: 6.5 (ref 5.0–8.0)

## 2016-04-01 LAB — BASIC METABOLIC PANEL
ANION GAP: 5 (ref 5–15)
BUN: <5 mg/dL — ABNORMAL LOW (ref 6–20)
CALCIUM: 8.3 mg/dL — AB (ref 8.9–10.3)
CO2: 29 mmol/L (ref 22–32)
CREATININE: 0.58 mg/dL — AB (ref 0.61–1.24)
Chloride: 104 mmol/L (ref 101–111)
GLUCOSE: 134 mg/dL — AB (ref 65–99)
Potassium: 2.9 mmol/L — ABNORMAL LOW (ref 3.5–5.1)
Sodium: 138 mmol/L (ref 135–145)

## 2016-04-01 LAB — CBC
HEMATOCRIT: 34.9 % — AB (ref 39.0–52.0)
Hemoglobin: 11.1 g/dL — ABNORMAL LOW (ref 13.0–17.0)
MCH: 28.9 pg (ref 26.0–34.0)
MCHC: 31.8 g/dL (ref 30.0–36.0)
MCV: 90.9 fL (ref 78.0–100.0)
PLATELETS: 227 10*3/uL (ref 150–400)
RBC: 3.84 MIL/uL — ABNORMAL LOW (ref 4.22–5.81)
RDW: 13.1 % (ref 11.5–15.5)
WBC: 9.3 10*3/uL (ref 4.0–10.5)

## 2016-04-01 MED ORDER — POTASSIUM CHLORIDE CRYS ER 20 MEQ PO TBCR
20.0000 meq | EXTENDED_RELEASE_TABLET | Freq: Two times a day (BID) | ORAL | Status: AC
  Administered 2016-04-01 – 2016-04-02 (×4): 20 meq via ORAL
  Filled 2016-04-01 (×4): qty 1

## 2016-04-01 MED ORDER — KCL IN DEXTROSE-NACL 20-5-0.9 MEQ/L-%-% IV SOLN
INTRAVENOUS | Status: DC
  Administered 2016-04-01 – 2016-04-03 (×5): via INTRAVENOUS
  Filled 2016-04-01 (×5): qty 1000

## 2016-04-01 MED ORDER — POTASSIUM CHLORIDE 10 MEQ/100ML IV SOLN
10.0000 meq | INTRAVENOUS | Status: AC
  Administered 2016-04-01 (×4): 10 meq via INTRAVENOUS
  Filled 2016-04-01 (×4): qty 100

## 2016-04-01 NOTE — Progress Notes (Signed)
4 Days Post-Op  Subjective: No specific complaints. Wife at bedside. Taking some full liquids but no appetite. States he has had some flatus and a small bowel movement. Some incisional pain unchanged. Has not yet been walking.  Objective: Vital signs in last 24 hours: Temp:  [98.6 F (37 C)-99.5 F (37.5 C)] 99.5 F (37.5 C) (09/23 0500) Pulse Rate:  [77-86] 86 (09/23 0500) Resp:  [16-18] 18 (09/23 0500) BP: (140-147)/(66-74) 142/71 (09/23 0500) SpO2:  [96 %-98 %] 96 % (09/23 0500) Last BM Date: 03/31/16  Intake/Output from previous day: 09/22 0701 - 09/23 0700 In: 4666.7 [I.V.:4166.7; IV Piggyback:500] Out: -  Intake/Output this shift: No intake/output data recorded.  General appearance: alert, cooperative and no distress GI: normal findings: soft, non-tender and Nondistended Incision/Wound: Small open midline incision clean  Lab Results:   Recent Labs  03/31/16 0508 04/01/16 0548  WBC 10.4 9.3  HGB 11.5* 11.1*  HCT 35.9* 34.9*  PLT 210 227   BMET  Recent Labs  03/31/16 1006 04/01/16 0548  NA 140 138  K 3.1* 2.9*  CL 104 104  CO2 29 29  GLUCOSE 158* 134*  BUN 6 <5*  CREATININE 0.71 0.58*  CALCIUM 8.3* 8.3*     Studies/Results: No results found.  Anti-infectives: Anti-infectives    Start     Dose/Rate Route Frequency Ordered Stop   03/29/16 0400  ertapenem (INVANZ) 1 g in sodium chloride 0.9 % 50 mL IVPB     1 g 100 mL/hr over 30 Minutes Intravenous Every 24 hours 03/29/16 0330 03/30/16 0334   03/28/16 1900  cefTRIAXone (ROCEPHIN) 1 g in dextrose 5 % 50 mL IVPB     1 g 100 mL/hr over 30 Minutes Intravenous  Once 03/28/16 1854 03/28/16 2020   03/28/16 1900  metroNIDAZOLE (FLAGYL) IVPB 500 mg     500 mg 100 mL/hr over 60 Minutes Intravenous  Once 03/28/16 1854 03/28/16 2025      Assessment/Plan: s/p Procedure(s): LAPAROSCOPIC  INGUINAL HERNIA SMALL BOWEL RESECTION No apparent complications. Appetite poor, leave on full liquids for now. Needs  to increase activity, orders placed Hypokalemia. Replace and check labs tomorrow Postoperative delirium seems resolved.   LOS: 4 days    Eshal Propps T 9/23/2017Patient ID: Christopher Dalton, male   DOB: October 21, 1940, 75 y.o.   MRN: [id number]

## 2016-04-02 LAB — BASIC METABOLIC PANEL
ANION GAP: 6 (ref 5–15)
BUN: <5 mg/dL — ABNORMAL LOW (ref 6–20)
CALCIUM: 8.7 mg/dL — AB (ref 8.9–10.3)
CO2: 30 mmol/L (ref 22–32)
CREATININE: 0.65 mg/dL (ref 0.61–1.24)
Chloride: 103 mmol/L (ref 101–111)
GLUCOSE: 143 mg/dL — AB (ref 65–99)
Potassium: 3.3 mmol/L — ABNORMAL LOW (ref 3.5–5.1)
Sodium: 139 mmol/L (ref 135–145)

## 2016-04-02 LAB — CULTURE, BLOOD (ROUTINE X 2)
CULTURE: NO GROWTH
CULTURE: NO GROWTH

## 2016-04-02 NOTE — Progress Notes (Signed)
5 Days Post-Op  Subjective: Doesn't feel well but has difficulty being specific. Admits to some nausea. Mild abdominal pain. Wife says he seems a little confused and anxious in the mornings. Has had a bowel movement.  Objective: Vital signs in last 24 hours: Temp:  [98.4 F (36.9 C)-100.1 F (37.8 C)] 98.4 F (36.9 C) (09/24 0627) Pulse Rate:  [77-86] 83 (09/24 0627) Resp:  [18] 18 (09/24 0627) BP: (101-144)/(40-76) 144/71 (09/24 0627) SpO2:  [91 %-96 %] 96 % (09/24 0627) Last BM Date: 03/31/16  Intake/Output from previous day: 09/23 0701 - 09/24 0700 In: 1720 [P.O.:360; I.V.:960; IV Piggyback:400] Out: 300 [Urine:300] Intake/Output this shift: No intake/output data recorded.  General appearance: alert, cooperative and Mildly anxious Resp: No wheezing or increased work of breathing GI: normal findings: soft, non-tender and Nondistended Incision/Wound: Open midline wound clean and laparoscopic incisions healing well  Lab Results:   Recent Labs  03/31/16 0508 04/01/16 0548  WBC 10.4 9.3  HGB 11.5* 11.1*  HCT 35.9* 34.9*  PLT 210 227   BMET  Recent Labs  04/01/16 0548 04/02/16 0151  NA 138 139  K 2.9* 3.3*  CL 104 103  CO2 29 30  GLUCOSE 134* 143*  BUN <5* <5*  CREATININE 0.58* 0.65  CALCIUM 8.3* 8.7*     Studies/Results: No results found.  Anti-infectives: Anti-infectives    Start     Dose/Rate Route Frequency Ordered Stop   03/29/16 0400  ertapenem (INVANZ) 1 g in sodium chloride 0.9 % 50 mL IVPB     1 g 100 mL/hr over 30 Minutes Intravenous Every 24 hours 03/29/16 0330 03/30/16 0334   03/28/16 1900  cefTRIAXone (ROCEPHIN) 1 g in dextrose 5 % 50 mL IVPB     1 g 100 mL/hr over 30 Minutes Intravenous  Once 03/28/16 1854 03/28/16 2020   03/28/16 1900  metroNIDAZOLE (FLAGYL) IVPB 500 mg     500 mg 100 mL/hr over 60 Minutes Intravenous  Once 03/28/16 1854 03/28/16 2025      Assessment/Plan: s/p Procedure(s): LAPAROSCOPIC  INGUINAL HERNIA SMALL  BOWEL RESECTION Slow progress but I do not see evidence of complications. By mouth intake was poor and I will leave on full liquids today Hypokalemia improved, on oral replacement today Labs in a.m.   LOS: 5 days    Christopher Dalton T 9/24/2017Patient ID: Christopher ClockAlton E Dalton, male   DOB: Jan 17, 1941, 75 y.o.   MRN: [id number]

## 2016-04-03 LAB — BASIC METABOLIC PANEL
Anion gap: 7 (ref 5–15)
CHLORIDE: 104 mmol/L (ref 101–111)
CO2: 29 mmol/L (ref 22–32)
Calcium: 8.7 mg/dL — ABNORMAL LOW (ref 8.9–10.3)
Creatinine, Ser: 0.77 mg/dL (ref 0.61–1.24)
GFR calc Af Amer: >60 mL/min (ref >60)
GLUCOSE: 148 mg/dL — AB (ref 65–99)
POTASSIUM: 3.3 mmol/L — AB (ref 3.5–5.1)
Sodium: 140 mmol/L (ref 135–145)

## 2016-04-03 LAB — CBC
HCT: 35.9 % — ABNORMAL LOW (ref 39.0–52.0)
Hemoglobin: 11.7 g/dL — ABNORMAL LOW (ref 13.0–17.0)
MCH: 29.3 pg (ref 26.0–34.0)
MCHC: 32.6 g/dL (ref 30.0–36.0)
MCV: 90 fL (ref 78.0–100.0)
PLATELETS: 290 10*3/uL (ref 150–400)
RBC: 3.99 MIL/uL — AB (ref 4.22–5.81)
RDW: 13 % (ref 11.5–15.5)
WBC: 9.6 10*3/uL (ref 4.0–10.5)

## 2016-04-03 MED ORDER — OXYCODONE-ACETAMINOPHEN 5-325 MG PO TABS: 1.0000 | ORAL_TABLET | ORAL | Status: DC | PRN

## 2016-04-03 MED ORDER — POTASSIUM CHLORIDE CRYS ER 20 MEQ PO TBCR
20.0000 meq | EXTENDED_RELEASE_TABLET | Freq: Two times a day (BID) | ORAL | Status: DC
  Administered 2016-04-03 – 2016-04-04 (×2): 20 meq via ORAL
  Filled 2016-04-03 (×3): qty 1

## 2016-04-03 MED ORDER — HYDROCODONE-ACETAMINOPHEN 5-325 MG PO TABS
1.0000 | ORAL_TABLET | ORAL | Status: DC | PRN
  Administered 2016-04-03 – 2016-04-04 (×2): 1 via ORAL
  Filled 2016-04-03 (×2): qty 1

## 2016-04-03 NOTE — Care Management Important Message (Signed)
Important Message  Patient Details  Name: Christopher Dalton MRN: [id number] Date of Birth: [birth year]   Medicare Important Message Given:  Yes    [name] 04/03/2016, 1:28 PM

## 2016-04-03 NOTE — Progress Notes (Signed)
Patient ID: Christopher Dalton, male   DOB: [birth year], 75 y.o.   MRN: [id number]  Micah FlesherWent to check on patient again. Half-eaten hamburger sitting beside bed. Patient still confused and appears very tired. Recommend that he stay in hospital tonight and consider discharge tomorrow.  [name]

## 2016-04-03 NOTE — Care Management Note (Signed)
Case Management Note  Patient Details  Name: Christopher Dalton MRN: [id number] Date of Birth: June 27, 1941  Subjective/Objective:                    Action/Plan:   Expected Discharge Date:                  Expected Discharge Plan:  Home w Home Health Services  In-House Referral:     Discharge planning Services  CM Consult  Post Acute Care Choice:  Home Health Choice offered to:  Patient, Spouse, Adult Children  DME Arranged:    DME Agency:     HH Arranged:  RN HH Agency:  [facility]  Status of Service:  Completed, signed off  If discussed at Long Length of Stay Meetings, dates discussed:    Additional Comments:  Kingsley PlanWile, Christopher Haviland Marie, RN 04/03/2016, 2:22 PM

## 2016-04-03 NOTE — Progress Notes (Signed)
6 Days Post-Op  Subjective: Very confused, got 6 mg dilaudid yesterday and 1 mg this AM.  Haldol this AM 0238, up all night and no one slept.  His wife is exhausted and wants to get him home.  Not sure they can handle him yet.  He is also on chronic hydrocodone for pain at home.  He is very sleepy this AM.  We changed his dressing and he has a very strong grip.  Sites OK and open area looks fine.  He had a BM today.    Objective: Vital signs in last 24 hours: Temp:  [98.1 F (36.7 C)-99.4 F (37.4 C)] 98.1 F (36.7 C) (09/25 0457) Pulse Rate:  [81-108] 108 (09/25 0457) Resp:  [17-18] 18 (09/24 2228) BP: (130-146)/(68-72) 130/72 (09/25 0457) SpO2:  [94 %-96 %] 96 % (09/25 0457) Last BM Date: 04/02/16 120 PO yesterday IV 1185  Voided x 5 Stool x 1 Intake/Output from previous day: 09/24 0701 - 09/25 0700 In: 1306.3 [P.O.:120; I.V.:1186.3] Out: -  Intake/Output this shift: Total I/O In: 50 [P.O.:50] Out: -   General appearance: Pt very sleepy, hard to wake up.  He didn't speak much during my time there.  His wife is very anxious over all of this.  Resp: clear to auscultation bilaterally GI: soft, few BS, port sites OK, Open sitel looks fine. + BM  Lab Results:   Recent Labs  04/01/16 0548 04/03/16 0601  WBC 9.3 9.6  HGB 11.1* 11.7*  HCT 34.9* 35.9*  PLT 227 290    BMET  Recent Labs  04/02/16 0151 04/03/16 0601  NA 139 140  K 3.3* 3.3*  CL 103 104  CO2 30 29  GLUCOSE 143* 148*  BUN <5* <5*  CREATININE 0.65 0.77  CALCIUM 8.7* 8.7*   PT/INR No results for input(s): LABPROT, INR in the last 72 hours.   Recent Labs Lab 03/28/16 1641  AST 35  ALT 32  ALKPHOS 53  BILITOT 1.0  PROT 7.7  ALBUMIN 4.8     Lipase     Component Value Date/Time   LIPASE 18 03/28/2016 1641     Studies/Results: No results found. Prior to Admission medications   Medication Sig Start Date End Date Taking? Authorizing Provider  benazepril-hydrochlorthiazide (LOTENSIN HCT)  20-12.5 MG tablet TAKE 1 TABLET BY MOUTH DAILY. 04/13/15  Yes Gordy SaversPeter F Kwiatkowski, MD  CRANBERRY PO Take 1 tablet by mouth daily.   Yes Historical Provider, MD  diazepam (VALIUM) 5 MG tablet Take 1 tablet (5 mg total) by mouth at bedtime as needed. Patient taking differently: Take 5 mg by mouth at bedtime as needed for anxiety.  02/14/16  Yes Gordy SaversPeter F Kwiatkowski, MD  docusate sodium (COLACE) 100 MG capsule Take 1 capsule (100 mg total) by mouth 2 (two) times daily as needed for mild constipation. 01/31/14  Yes Jene EveryJeffrey Beane, MD  HYDROcodone-acetaminophen (NORCO) 7.5-325 MG tablet Take 1 tablet by mouth every 4 (four) hours as needed for moderate pain. 03/10/16  Yes Gordy SaversPeter F Kwiatkowski, MD  loratadine (CLARITIN) 10 MG tablet Take 10 mg by mouth daily.     Yes Historical Provider, MD  metFORMIN (GLUCOPHAGE) 500 MG tablet TAKE 1 TABLET (500 MG TOTAL) BY MOUTH 2 (TWO) TIMES DAILY WITH A MEAL. 03/08/16  Yes Gordy SaversPeter F Kwiatkowski, MD  Multiple Vitamin (MULTIVITAMIN WITH MINERALS) TABS tablet Take 1 tablet by mouth daily.   Yes Historical Provider, MD  predniSONE (DELTASONE) 5 MG tablet 2.5 milligrams even days of the  week Patient taking differently: Take 2.5-5 mg by mouth See admin instructions. Take 2.5 mg by mouth on even days and take 5 mg by mouth on odd days. 11/26/14  Yes Gordy Savers, MD  acetaminophen (TYLENOL) 500 MG tablet Take 500 mg by mouth as needed for mild pain.     Historical Provider, MD  naproxen (NAPROSYN) 375 MG tablet Take 1 tablet (375 mg total) by mouth 2 (two) times daily. Patient not taking: Reported on 03/29/2016 02/04/16   Shaune Pollack, MD  omeprazole (PRILOSEC) 20 MG capsule Take 1 capsule (20 mg total) by mouth daily. Patient not taking: Reported on 03/29/2016 02/04/16 03/29/16  Shaune Pollack, MD    Medications: . benazepril  20 mg Oral Daily   And  . hydrochlorothiazide  12.5 mg Oral Daily  . enoxaparin (LOVENOX) injection  40 mg Subcutaneous Q24H  . mouth rinse  15 mL  Mouth Rinse BID  . ondansetron  4 mg Oral Once  . predniSONE  2.5 mg Oral Q breakfast   . dextrose 5 % and 0.9 % NaCl with KCl 20 mEq/L 75 mL/hr at 04/03/16 1001   CAD COPD Hypertension  Osteoarthritis    Assessment/Plan  Incarcerated right inguinal hernia S/p LAPAROSCOPIC  INGUINAL HERNIA (Right) - RIGHT INGUINAL HERNIA REPAIR, TEP APPROACH, EXPLORATORY LAPAROTOMY, SMALL BOWEL RESECTION WITH ANASTAMOSIS, 03/28/16, Dr. Axel Filler  POD 6 Post op confusion - haldol 0238 hours this AM Hypokalemia - PO KCl replacement. FEN:  Full liquids/IV fluids ID:  No abx DVT:  Lovenox  Plan:  Stop the dilaudid.  Put him back on hydrocodone as before.  Advance diet, see how he does. Wife is very concerned about his hands being pale, H/H stable since surgery.       LOS: 6 days    Zyad Boomer 04/03/2016 [phone]

## 2016-04-03 NOTE — Discharge Instructions (Signed)
[facility] [phone]  OPEN ABDOMINAL SURGERY: POST OP INSTRUCTIONS  Always review your discharge instruction sheet given to you by the facility where your surgery was performed.  IF YOU HAVE DISABILITY OR FAMILY LEAVE FORMS, YOU MUST BRING THEM TO THE OFFICE FOR PROCESSING.  PLEASE DO NOT GIVE THEM TO YOUR DOCTOR.  1. A prescription for pain medication may be given to you upon discharge.  Take your pain medication as prescribed, if needed.  If narcotic pain medicine is not needed, then you may take acetaminophen (Tylenol) or ibuprofen (Advil) as needed. 2. Take your usually prescribed medications unless otherwise directed. 3. If you need a refill on your pain medication, please contact your pharmacy. They will contact our office to request authorization.  Prescriptions will not be filled after 5pm or on week-ends. 4. You should follow a light diet the first few days after arrival home, such as soup and crackers, pudding, etc.unless your doctor has advised otherwise. A high-fiber, low fat diet can be resumed as tolerated.   Be sure to include lots of fluids daily. Most patients will experience some swelling and bruising on the chest and neck area.  Ice packs will help.  Swelling and bruising can take several days to resolve 5. Most patients will experience some swelling and bruising in the area of the incision. Ice pack will help. Swelling and bruising can take several days to resolve..  6. It is common to experience some constipation if taking pain medication after surgery.  Increasing fluid intake and taking a stool softener will usually help or prevent this problem from occurring.  A mild laxative (Milk of Magnesia or Miralax) should be taken according to package directions if there are no bowel movements after 48 hours. 7.  You may have steri-strips (small skin tapes) in place directly over the incision.  These strips should be left on the skin for 7-10 days.  If your  surgeon used skin glue on the incision, you may shower in 24 hours.  The glue will flake off over the next 2-3 weeks.  Any sutures or staples will be removed at the office during your follow-up visit. You may find that a light gauze bandage over your incision may keep your staples from being rubbed or pulled. You may shower and replace the bandage daily. 8. ACTIVITIES:  You may resume regular (light) daily activities beginning the next day--such as daily self-care, walking, climbing stairs--gradually increasing activities as tolerated.  You may have sexual intercourse when it is comfortable.  Refrain from any heavy lifting or straining until approved by your doctor. a. You may drive when you no longer are taking prescription pain medication, you can comfortably wear a seatbelt, and you can safely maneuver your car and apply brakes b. Return to Work: ___________________________________ 9. You should see your doctor in the office for a follow-up appointment approximately two weeks after your surgery.  Make sure that you call for this appointment within a day or two after you arrive home to insure a convenient appointment time. OTHER INSTRUCTIONS:  _____________________________________________________________ _____________________________________________________________  WHEN TO CALL YOUR DOCTOR: 1. Fever over 101.0 2. Inability to urinate 3. Nausea and/or vomiting 4. Extreme swelling or bruising 5. Continued bleeding from incision. 6. Increased pain, redness, or drainage from the incision. 7. Difficulty swallowing or breathing 8. Muscle cramping or spasms. 9. Numbness or tingling in hands or feet or around lips.  The clinic staff is available to   answer your questions during regular business hours.  Please don't hesitate to call and ask to speak to one of the nurses if you have concerns.  For further questions, please visit [email or web address]   

## 2016-04-03 NOTE — Progress Notes (Signed)
After receiving Haldol, patientt rested briefly then was up walking in the halls for several laps. Patient's wife states he was looking for a way to leave.  When he returned to his room, patient told his wife there was someone in his bathroom when there was no one there.  He was unable to tell me where he was or what had happened to him.  He did not know he had surgery.  He did complain of stomach pain and was medicated with 1mg  Dilaudid IV.  Currently he is sitting quietly at the nurses' desk.

## 2016-04-03 NOTE — Progress Notes (Deleted)
error 

## 2016-04-03 NOTE — Progress Notes (Signed)
Patient up wandering the room; confused as to where he is and what the equipment-IV,SCD's ,etc. Is for.  Pulling at IV. He denies pain. Per patient's wife, he has been up and down for the last hour trying to "check the layout."  Medicated patient with 5mg  IV Haldol for agitation.

## 2016-04-04 MED ORDER — HYDROCODONE-ACETAMINOPHEN 5-325 MG PO TABS: 1.0000 | ORAL_TABLET | Freq: Four times a day (QID) | ORAL | 0 refills | Status: DC | PRN

## 2016-04-04 NOTE — Discharge Summary (Signed)
[facility] Discharge Summary   Patient ID: Christopher Dalton MRN: [id number] DOB/AGE: 1940/10/05 75 y.o.  Admit date: 03/28/2016 Discharge date: 04/04/2016  Admitting Diagnosis: Incarcerated inguinal hernia Lactic acidosis Dehydration Leukocytosis Nausea and vomiting  Discharge Diagnosis Patient Active Problem List   Diagnosis Date Noted  . S/P hernia repair 03/29/2016  . Hernia, inguinal, right 03/28/2016  . Statin intolerance 12/08/2015  . Diabetes mellitus without complication (HCC) 02/23/2014  . 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 Coronary atherosclerosis 11/15/2009  . DEPRESSION 11/08/2009  . ELECTROCARDIOGRAM, ABNORMAL 11/08/2009  . NOSEBLEED 05/17/2009  . Hypotension 04/13/2009  . SHINGLES 04/02/2009  . HIP PAIN, LEFT 10/29/2008  . INGUINAL HERNIA, RIGHT 03/25/2007  . Essential hypertension 01/21/2007  . Allergic rhinitis 01/21/2007  . COPD exacerbation (HCC) 01/21/2007  . Osteoarthritis 01/21/2007  . LOW BACK PAIN 01/21/2007    Consultants None  Imaging: DG chest 03/28/16: 1. Chronic pulmonary hyperinflation. No superimposed acute findings are identified. 2. Calcified aortic atherosclerosis.  Procedures Dr. Derrell Lollingamirez (03/28/16) -  LAPAROSCOPIC  INGUINAL HERNIA (Right) - RIGHT INGUINAL HERNIA REPAIR, TEP APPROACH EXPLORATORY LAPAROTOMY, SMALL BOWEL RESECTION WITH ANASTAMOSIS  Hospital Course:  Christopher Dalton is a 75yo male who [facility] with right inguinal pain and bulging for 2-3 days as well as nausea and vomiting. Workup showed an incarcerated inguinal hernia.  Patient was admitted and underwent procedure listed above.  Tolerated procedure well and was transferred to the floor.  He struggled with some waxing and waning postoperative delirium and agitation, but this resolved by POD 7 after removing all pain medications and haldol. Diet was advanced as tolerated and patient had a BM on POD 3. He  was slow to regain his appetite but on POD7 he was tolerating a regular diet, voiding well, ambulating well, pain well controlled, vital signs stable, incisions c/d/i and felt stable for discharge home.  Patient's son will help him perform BID wet to dry dressing changes, and he will follow up in our office with Dr. Derrell Lollingamirez in 1 week.   Physical Exam: General appearance: alert and oriented, NAD, a little nervous about going home Resp: clear to auscultation bilaterally GI: soft, +BS, port sites cdi, open incision cdi with no drainage    Medication List    STOP taking these medications   HYDROcodone-acetaminophen 7.5-325 MG tablet Commonly known as:  NORCO Replaced by:  HYDROcodone-acetaminophen 5-325 MG tablet   omeprazole 20 MG capsule Commonly known as:  PRILOSEC     TAKE these medications   acetaminophen 500 MG tablet Commonly known as:  TYLENOL Take 500 mg by mouth as needed for mild pain.   benazepril-hydrochlorthiazide 20-12.5 MG tablet Commonly known as:  LOTENSIN HCT TAKE 1 TABLET BY MOUTH DAILY.   CRANBERRY PO Take 1 tablet by mouth daily.   diazepam 5 MG tablet Commonly known as:  VALIUM Take 1 tablet (5 mg total) by mouth at bedtime as needed. What changed:  reasons to take this   docusate sodium 100 MG capsule Commonly known as:  COLACE Take 1 capsule (100 mg total) by mouth 2 (two) times daily as needed for mild constipation.   HYDROcodone-acetaminophen 5-325 MG tablet Commonly known as:  NORCO/VICODIN Take 1 tablet by mouth every 6 (six) hours as needed for moderate pain or severe pain. Replaces:  HYDROcodone-acetaminophen 7.5-325 MG tablet   loratadine 10 MG tablet Commonly known as:  CLARITIN Take 10 mg by mouth daily.   metFORMIN 500 MG  tablet Commonly known as:  GLUCOPHAGE TAKE 1 TABLET (500 MG TOTAL) BY MOUTH 2 (TWO) TIMES DAILY WITH A MEAL.   multivitamin with minerals Tabs tablet Take 1 tablet by mouth daily.   naproxen 375 MG  tablet Commonly known as:  NAPROSYN Take 1 tablet (375 mg total) by mouth 2 (two) times daily.   predniSONE 5 MG tablet Commonly known as:  DELTASONE 2.5 milligrams even days of the week What changed:  how much to take  how to take this  when to take this  additional instructions        Follow-up Information    Lajean Saver, MD .   Specialty:  General Surgery Why:  Your follow-up appointment is 04/13/2016 at 9am. Please arrive 30 minutes prior to appointment to fill out necessary paperwork. Contact information: [address] [place] [address] [phone]           Signed: Edson Snowball, [facility] 04/04/2016, 12:32 PM Pager: [phone] Consults: [phone] Mon-Fri 7:00 am-4:30 pm Sat-Sun 7:00 am-11:30 am

## 2016-04-04 NOTE — Progress Notes (Signed)
Patient ID: Christopher Dalton, male   DOB: 1941/02/27, 75 y.o.   MRN: [id number]  [facility]  7 Days Post-Op  Subjective: Less agitated and confused this morning, although he was a little emotional over night. Denies any current pain. Tolerating breakfast and had a BM.  Objective: Vital signs in last 24 hours: Temp:  [98.3 F (36.8 C)-99 F (37.2 C)] 99 F (37.2 C) (09/26 0611) Pulse Rate:  [74-90] 90 (09/26 0611) Resp:  [16-18] 16 (09/26 0611) BP: (137-150)/(58-65) 150/65 (09/26 0611) SpO2:  [95 %-99 %] 95 % (09/26 0611) Last BM Date: 04/03/16  Intake/Output from previous day: 09/25 0701 - 09/26 0700 In: 320.4 [P.O.:110; I.V.:210.4] Out: -  Intake/Output this shift: No intake/output data recorded.  PE: General appearance: alert and oriented, NAD, a little nervous about going home Resp: clear to auscultation bilaterally GI: soft, +BS, port sites cdi, open incision cdi with no drainage  Lab Results:   Recent Labs  04/03/16 0601  WBC 9.6  HGB 11.7*  HCT 35.9*  PLT 290   BMET  Recent Labs  04/02/16 0151 04/03/16 0601  NA 139 140  K 3.3* 3.3*  CL 103 104  CO2 30 29  GLUCOSE 143* 148*  BUN <5* <5*  CREATININE 0.65 0.77  CALCIUM 8.7* 8.7*   PT/INR No results for input(s): LABPROT, INR in the last 72 hours. CMP     Component Value Date/Time   NA 140 04/03/2016 0601   K 3.3 (L) 04/03/2016 0601   CL 104 04/03/2016 0601   CO2 29 04/03/2016 0601   GLUCOSE 148 (H) 04/03/2016 0601   BUN <5 (L) 04/03/2016 0601   CREATININE 0.77 04/03/2016 0601   CALCIUM 8.7 (L) 04/03/2016 0601   PROT 7.7 03/28/2016 1641   ALBUMIN 4.8 03/28/2016 1641   AST 35 03/28/2016 1641   ALT 32 03/28/2016 1641   ALKPHOS 53 03/28/2016 1641   BILITOT 1.0 03/28/2016 1641   GFRNONAA >60 04/03/2016 0601   GFRAA >60 04/03/2016 0601   Lipase     Component Value Date/Time   LIPASE 18 03/28/2016 1641       Studies/Results: No results  found.  Anti-infectives: Anti-infectives    Start     Dose/Rate Route Frequency Ordered Stop   03/29/16 0400  ertapenem (INVANZ) 1 g in sodium chloride 0.9 % 50 mL IVPB     1 g 100 mL/hr over 30 Minutes Intravenous Every 24 hours 03/29/16 0330 03/30/16 0334   03/28/16 1900  cefTRIAXone (ROCEPHIN) 1 g in dextrose 5 % 50 mL IVPB     1 g 100 mL/hr over 30 Minutes Intravenous  Once 03/28/16 1854 03/28/16 2020   03/28/16 1900  metroNIDAZOLE (FLAGYL) IVPB 500 mg     500 mg 100 mL/hr over 60 Minutes Intravenous  Once 03/28/16 1854 03/28/16 2025       Assessment/Plan Incarcerated right inguinal hernia S/p LAPAROSCOPIC INGUINAL HERNIA (Right) - RIGHT INGUINAL HERNIA REPAIR, TEP APPROACH, EXPLORATORY LAPAROTOMY, SMALL BOWEL RESECTION WITH ANASTAMOSIS, 03/28/16, Dr. Axel FillerArmando Ramirez  POD 7 Post op confusion - now off haldol and dilaudid, oriented and feeling better this morning Hypokalemia - PO KCl replacement.  FEN:  soft diet, IVF DVT:  Lovenox  Plan: mental status improved, ready for discharge. Patient will perform BID wet to dry dressing changes, and follow-up with Dr. Derrell Lollingamirez in 1-2 weeks   LOS: 7 days    Christopher Dalton , [facility] 04/04/2016, 9:17 AM Pager:  [phone] Consults: [phone] Mon-Fri 7:00 am-4:30 pm Sat-Sun 7:00 am-11:30 am

## 2016-04-04 NOTE — Progress Notes (Signed)
Patient has slept most of shift;has gotten up a few times to go the bathroom and he walked around the unit once.  He was confused about the location of the bathroom but was easily redirected.  No agitation. He has not complained of pain.

## 2016-04-04 NOTE — Discharge Planning (Signed)
Patient discharged home in stable condition. Wife Verbalizes understanding of all discharge instructions, including home medications and follow up appointments.

## 2016-04-07 ENCOUNTER — Telehealth: Payer: Self-pay | Admitting: Internal Medicine

## 2016-04-07 MED ORDER — HYDROCODONE-ACETAMINOPHEN 5-325 MG PO TABS: 1.0000 | ORAL_TABLET | Freq: Four times a day (QID) | ORAL | 0 refills | Status: DC | PRN

## 2016-04-07 NOTE — Telephone Encounter (Signed)
° ° ° ° ° °  Pt request refill of the following: ° °HYDROcodone-acetaminophen (NORCO/VICODIN) 5-325 MG tablet ° ° °Phamacy: °

## 2016-04-10 NOTE — Telephone Encounter (Signed)
Tried to contact pt no answer phone just rings and rings no voicemail. Rx ready for pickup is at the front desk. Rx printed and signed.

## 2016-04-11 NOTE — Telephone Encounter (Signed)
Tried to contact pt no answer phone just rings and rings no voicemail. Rx ready for pickup.

## 2016-04-14 ENCOUNTER — Telehealth: Payer: Self-pay | Admitting: Internal Medicine

## 2016-04-14 NOTE — Telephone Encounter (Signed)
Error/ltd ° °

## 2016-04-14 NOTE — Telephone Encounter (Addendum)
Pt wife is calling and requesting hydrocodone 7.5- 325 mg

## 2016-04-17 NOTE — Telephone Encounter (Signed)
Pt will need to schedule an appointment to discuss pain medication.

## 2016-04-17 NOTE — Telephone Encounter (Signed)
Tried to contact pt again phone just rings and rings no voicemail.

## 2016-04-18 ENCOUNTER — Emergency Department (HOSPITAL_COMMUNITY): Payer: Medicare Other

## 2016-04-18 ENCOUNTER — Encounter (HOSPITAL_COMMUNITY): Payer: Self-pay | Admitting: Emergency Medicine

## 2016-04-18 ENCOUNTER — Emergency Department (HOSPITAL_COMMUNITY)
Admission: EM | Admit: 2016-04-18 | Discharge: 2016-04-18 | Disposition: A | Discharge status: Home / Self Care | Payer: Medicare Other | Attending: Emergency Medicine | Admitting: Emergency Medicine

## 2016-04-18 DIAGNOSIS — Z79899 Other long term (current) drug therapy: Secondary | ICD-10-CM | POA: Insufficient documentation

## 2016-04-18 DIAGNOSIS — E119 Type 2 diabetes mellitus without complications: Secondary | ICD-10-CM | POA: Insufficient documentation

## 2016-04-18 DIAGNOSIS — R1030 Lower abdominal pain, unspecified: Secondary | ICD-10-CM | POA: Diagnosis not present

## 2016-04-18 DIAGNOSIS — G8918 Other acute postprocedural pain: Secondary | ICD-10-CM | POA: Diagnosis not present

## 2016-04-18 DIAGNOSIS — I10 Essential (primary) hypertension: Secondary | ICD-10-CM | POA: Insufficient documentation

## 2016-04-18 DIAGNOSIS — Z7984 Long term (current) use of oral hypoglycemic drugs: Secondary | ICD-10-CM | POA: Insufficient documentation

## 2016-04-18 DIAGNOSIS — Z87891 Personal history of nicotine dependence: Secondary | ICD-10-CM | POA: Diagnosis not present

## 2016-04-18 DIAGNOSIS — J449 Chronic obstructive pulmonary disease, unspecified: Secondary | ICD-10-CM | POA: Diagnosis not present

## 2016-04-18 LAB — BASIC METABOLIC PANEL
Anion gap: 9 (ref 5–15)
BUN: 10 mg/dL (ref 6–20)
CALCIUM: 9.4 mg/dL (ref 8.9–10.3)
CO2: 26 mmol/L (ref 22–32)
CREATININE: 0.97 mg/dL (ref 0.61–1.24)
Chloride: 102 mmol/L (ref 101–111)
GFR calc Af Amer: >60 mL/min (ref >60)
Glucose, Bld: 111 mg/dL — ABNORMAL HIGH (ref 65–99)
Potassium: 3.4 mmol/L — ABNORMAL LOW (ref 3.5–5.1)
SODIUM: 137 mmol/L (ref 135–145)

## 2016-04-18 LAB — CBC WITH DIFFERENTIAL/PLATELET
BASOS ABS: 0 10*3/uL (ref 0.0–0.1)
Basophils Relative: 0 %
EOS ABS: 0.1 10*3/uL (ref 0.0–0.7)
EOS PCT: 1 %
HCT: 35.8 % — ABNORMAL LOW (ref 39.0–52.0)
Hemoglobin: 11.7 g/dL — ABNORMAL LOW (ref 13.0–17.0)
Lymphocytes Relative: 24 %
Lymphs Abs: 2.2 10*3/uL (ref 0.7–4.0)
MCH: 29 pg (ref 26.0–34.0)
MCHC: 32.7 g/dL (ref 30.0–36.0)
MCV: 88.6 fL (ref 78.0–100.0)
Monocytes Absolute: 1 10*3/uL (ref 0.1–1.0)
Monocytes Relative: 11 %
Neutro Abs: 5.8 10*3/uL (ref 1.7–7.7)
Neutrophils Relative %: 64 %
PLATELETS: 479 10*3/uL — AB (ref 150–400)
RBC: 4.04 MIL/uL — AB (ref 4.22–5.81)
RDW: 13.1 % (ref 11.5–15.5)
WBC: 9.2 10*3/uL (ref 4.0–10.5)

## 2016-04-18 LAB — URINALYSIS, ROUTINE W REFLEX MICROSCOPIC
Bilirubin Urine: NEGATIVE
Glucose, UA: NEGATIVE mg/dL
HGB URINE DIPSTICK: NEGATIVE
KETONES UR: 15 mg/dL — AB
LEUKOCYTES UA: NEGATIVE
Nitrite: NEGATIVE
PROTEIN: NEGATIVE mg/dL
Specific Gravity, Urine: 1.011 (ref 1.005–1.030)
pH: 7 (ref 5.0–8.0)

## 2016-04-18 MED ORDER — FENTANYL CITRATE (PF) 100 MCG/2ML IJ SOLN
50.0000 ug | Freq: Once | INTRAMUSCULAR | Status: AC
  Administered 2016-04-18: 50 ug via INTRAVENOUS
  Filled 2016-04-18: qty 2

## 2016-04-18 NOTE — Telephone Encounter (Signed)
Tried to contact pt again, phone rings and rings no answer. If pt calls please schedule appt with Dr.K to discuss pain medication.

## 2016-04-18 NOTE — ED Notes (Signed)
MD at bedside. 

## 2016-04-18 NOTE — ED Triage Notes (Signed)
Pt sts severe abd pain after having hernia operation x 3 weeks ago

## 2016-04-18 NOTE — Telephone Encounter (Signed)
Pt picked up Rx.

## 2016-04-18 NOTE — ED Provider Notes (Signed)
[facility]-EM[facility]ENCY DEPT Provider Note   CSN: [id number] Arrival date & time: 04/18/16  0825     History   Chief Complaint Chief Complaint  Patient presents with  . Abdominal Pain    HPI Christopher Dalton is a 75 y.o. male.  Patient is a 75 year old male who had a history of a laparoscopic inguinal hernia repair on the right done on September 19 after an incarcerated right inguinal hernia.   He also had resection of the incarcerated bowel.  He's been having worsening pain to his lower abdomen since that time. He feels like he has new hernias. He did have bilateral hernias, however only the right one was repaired on September 19. He denies any nausea. Denies any fevers. He states he's having small bowel movements. He denies any problem with urination. He tried to make a follow-up appointment with surgery but they can't see him until November 1. He is taking hydrocodone 5 mg tablets at home for pain.    Abdominal Pain   Associated symptoms include constipation. Pertinent negatives include fever, diarrhea, nausea, vomiting, frequency, hematuria, headaches and arthralgias.    Past Medical History:  Diagnosis Date  . C A D 11/15/2009  . COPD 01/21/2007  . DEPRESSION 11/08/2009  . GERD (gastroesophageal reflux disease)   . HIP PAIN, LEFT 10/29/2008  . HYPERTENSION 01/21/2007  . HYPOTENSION 04/13/2009  . IMPAIRED GLUCOSE TOLERANCE 03/02/2008  . INGUINAL HERNIA, RIGHT 03/25/2007  . LOW BACK PAIN 01/21/2007  . OSTEOARTHRITIS 01/21/2007    Patient Active Problem List   Diagnosis Date Noted  . S/P hernia repair 03/29/2016  . Hernia, inguinal, right 03/28/2016  . Statin intolerance 12/08/2015  . Diabetes mellitus without complication (HCC) 02/23/2014  . 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 Coronary atherosclerosis 11/15/2009  . DEPRESSION 11/08/2009  . ELECTROCARDIOGRAM, ABNORMAL 11/08/2009  . NOSEBLEED 05/17/2009  . Hypotension 04/13/2009  . SHINGLES  04/02/2009  . HIP PAIN, LEFT 10/29/2008  . INGUINAL HERNIA, RIGHT 03/25/2007  . Essential hypertension 01/21/2007  . Allergic rhinitis 01/21/2007  . COPD exacerbation (HCC) 01/21/2007  . Osteoarthritis 01/21/2007  . LOW BACK PAIN 01/21/2007    Past Surgical History:  Procedure Laterality Date  . BOWEL RESECTION  03/28/2016   Procedure: SMALL BOWEL RESECTION;  Surgeon: Axel Filler, MD;  Location: [facility] OR;  Service: General;;  . FRACTURE SURGERY  1958   face  . INGUINAL HERNIA REPAIR  2008   right  . INGUINAL HERNIA REPAIR Right 03/28/2016   Procedure: LAPAROSCOPIC  INGUINAL HERNIA;  Surgeon: Axel Filler, MD;  Location: [facility] OR;  Service: General;  Laterality: Right;  RIGHT INGUINAL HERNIA REPAIR   . INGUINAL HERNIA REPAIR  03/28/2016   INCARCERATED  . LUMBAR LAMINECTOMY/DECOMPRESSION MICRODISCECTOMY Left 01/28/2014   Procedure: Complete DECOMPRESSION lumbar laminectomy for spinal stenosis,  MICRODISCECTOMY  L3-4 ON LEFT for foraminotomy for L3-L4 nerve root;  Surgeon: Jacki Cones, MD;  Location: [facility];  Service: Orthopedics;  Laterality: Left;  . RECONSTRUCTION MID-FACE    . TONSILLECTOMY  unknown       Home Medications    Prior to Admission medications   Medication Sig Start Date End Date Taking? Authorizing Provider  benazepril-hydrochlorthiazide (LOTENSIN HCT) 20-12.5 MG tablet TAKE 1 TABLET BY MOUTH DAILY. 04/13/15  Yes Gordy Savers, MD  CRANBERRY PO Take 1 tablet by mouth daily.   Yes Historical Provider, MD  diazepam (VALIUM) 5 MG tablet Take 1 tablet (5 mg total) by mouth at bedtime  as needed. Patient taking differently: Take 5 mg by mouth at bedtime as needed for anxiety.  02/14/16  Yes Gordy SaversPeter F Kwiatkowski, MD  HYDROcodone-acetaminophen (NORCO/VICODIN) 5-325 MG tablet Take 1 tablet by mouth every 6 (six) hours as needed for moderate pain or severe pain. 04/07/16  Yes Gordy SaversPeter F Kwiatkowski, MD  loratadine (CLARITIN) 10 MG tablet Take 10 mg by mouth daily.     Yes  Historical Provider, MD  Multiple Vitamin (MULTIVITAMIN WITH MINERALS) TABS tablet Take 1 tablet by mouth daily.   Yes Historical Provider, MD  predniSONE (DELTASONE) 5 MG tablet 2.5 milligrams even days of the week Patient taking differently: Take 2.5-5 mg by mouth See admin instructions. Take 2.5 mg by mouth on even days and take 5 mg by mouth on odd days. 11/26/14  Yes Gordy SaversPeter F Kwiatkowski, MD  docusate sodium (COLACE) 100 MG capsule Take 1 capsule (100 mg total) by mouth 2 (two) times daily as needed for mild constipation. Patient not taking: Reported on 04/18/2016 01/31/14   Jene EveryJeffrey Beane, MD  metFORMIN (GLUCOPHAGE) 500 MG tablet TAKE 1 TABLET (500 MG TOTAL) BY MOUTH 2 (TWO) TIMES DAILY WITH A MEAL. Patient not taking: Reported on 04/18/2016 03/08/16   Gordy SaversPeter F Kwiatkowski, MD  naproxen (NAPROSYN) 375 MG tablet Take 1 tablet (375 mg total) by mouth 2 (two) times daily. Patient not taking: Reported on 03/29/2016 02/04/16   Shaune Pollackameron Isaacs, MD    Family History Family History  Problem Relation Age of Onset  . Cancer Mother     lung ca  . Cancer Father     unclear type    Social History Social History  Substance Use Topics  . Smoking status: Former Games developermoker  . Smokeless tobacco: Former NeurosurgeonUser  . Alcohol use Yes     Comment: 1-2 beers  week     Allergies   Barbiturates; Amoxicillin; Doxycycline; Penicillins; Pravastatin; Statins; and Sulfamethoxazole   Review of Systems Review of Systems  Constitutional: Negative for chills, diaphoresis, fatigue and fever.  HENT: Negative for congestion, rhinorrhea and sneezing.   Eyes: Negative.   Respiratory: Negative for cough, chest tightness and shortness of breath.   Cardiovascular: Negative for chest pain and leg swelling.  Gastrointestinal: Positive for abdominal pain and constipation. Negative for blood in stool, diarrhea, nausea and vomiting.  Genitourinary: Positive for scrotal swelling. Negative for difficulty urinating, flank pain,  frequency and hematuria.  Musculoskeletal: Negative for arthralgias and back pain.  Skin: Negative for rash.  Neurological: Negative for dizziness, speech difficulty, weakness, numbness and headaches.     Physical Exam Updated Vital Signs BP 143/67   Pulse 81   Temp 97.8 F (36.6 C) (Oral)   Resp 13   SpO2 96%   Physical Exam  Constitutional: He is oriented to person, place, and time. He appears well-developed and well-nourished.  HENT:  Head: Normocephalic and atraumatic.  Eyes: Pupils are equal, round, and reactive to light.  Neck: Normal range of motion. Neck supple.  Cardiovascular: Normal rate, regular rhythm and normal heart sounds.   Pulmonary/Chest: Effort normal and breath sounds normal. No respiratory distress. He has no wheezes. He has no rales. He exhibits no tenderness.  Abdominal: Soft. Bowel sounds are normal. There is no tenderness. There is no rebound and no guarding.  PT with healing open incision to midline lower abdominal wall.  No signs of infection.  Tenderness to bilateral inguinal area, greater on left.  No palpable hernias.  No noticeable swelling to scrotum.  No  pain to scrotum.  Musculoskeletal: Normal range of motion. He exhibits no edema.  Lymphadenopathy:    He has no cervical adenopathy.  Neurological: He is alert and oriented to person, place, and time.  Skin: Skin is warm and dry. No rash noted.  Psychiatric: He has a normal mood and affect.     ED Treatments / Results  Labs (all labs ordered are listed, but only abnormal results are displayed) Labs Reviewed  BASIC METABOLIC PANEL - Abnormal; Notable for the following:       Result Value   Potassium 3.4 (*)    Glucose, Bld 111 (*)    All other components within normal limits  CBC WITH DIFFERENTIAL/PLATELET - Abnormal; Notable for the following:    RBC 4.04 (*)    Hemoglobin 11.7 (*)    HCT 35.8 (*)    Platelets 479 (*)    All other components within normal limits  URINALYSIS, ROUTINE  W REFLEX MICROSCOPIC (NOT AT [facility]) - Abnormal; Notable for the following:    Ketones, ur 15 (*)    All other components within normal limits    EKG  EKG Interpretation None       Radiology Dg Abdomen 1 View  Result Date: 04/18/2016 CLINICAL DATA:  Right inguinal pain today. Recent hernia repair 3 weeks ago. EXAM: ABDOMEN - 1 VIEW COMPARISON:  None. FINDINGS: Nonobstructive bowel gas pattern. No organomegaly or free air. No suspicious calcification. Vascular calcifications noted in the pelvis. Degenerative changes in the lumbar spine and hips. No acute bony abnormality. IMPRESSION: No acute findings. Electronically Signed   By: Charlett Nose M.D.   On: 04/18/2016 10:02    Procedures Procedures (including critical care time)  Medications Ordered in ED Medications  fentaNYL (SUBLIMAZE) injection 50 mcg (50 mcg Intravenous Given 04/18/16 1041)     Initial Impression / Assessment and Plan / ED Course  I have reviewed the triage vital signs and the nursing notes.  Pertinent labs & imaging results that were available during my care of the patient were reviewed by me and considered in my medical decision making (see chart for details).  Clinical Course    PT with post op pain to Lower abdomen. I don't feel a definitive hernias on exam. There is no appreciable swelling. No warmth or erythema. No signs of surgical wound infections. His urine does not appear infected. I spoke with the surgical PA on call with [facility] surgery. She consulted with Dr. Andrey Campanile and it is felt the patient me discharged home without further imaging. They've arranged a follow-up appointment with Dr. Derrell Lolling on October 17. I explained this to the family and the patient. Per the surgery PA, they did have an appointment for yesterday but missed the appointment and a new appointment had been scheduled for November 1. The family states that he never knew about the appointment yesterday. Patient is advised to  follow-up with general surgery on October 17 or return here as needed if he has any worsening symptoms.  Final Clinical Impressions(s) / ED Diagnoses   Final diagnoses:  Post-op pain    New Prescriptions New Prescriptions   No medications on file     Rolan Bucco, MD 04/18/16 1221

## 2016-04-21 ENCOUNTER — Encounter (HOSPITAL_COMMUNITY): Payer: Self-pay | Admitting: General Surgery

## 2016-04-25 ENCOUNTER — Encounter (HOSPITAL_COMMUNITY): Payer: Self-pay | Admitting: General Surgery

## 2016-05-02 ENCOUNTER — Ambulatory Visit (INDEPENDENT_AMBULATORY_CARE_PROVIDER_SITE_OTHER): Payer: Medicare Other | Admitting: Family Medicine

## 2016-05-02 ENCOUNTER — Encounter: Payer: Self-pay | Admitting: Family Medicine

## 2016-05-02 VITALS — BP 110/70 | HR 92 | Temp 98.3°F | Ht 75.0 in | Wt 148.0 lb

## 2016-05-02 DIAGNOSIS — G8929 Other chronic pain: Secondary | ICD-10-CM | POA: Diagnosis not present

## 2016-05-02 DIAGNOSIS — I2510 Atherosclerotic heart disease of native coronary artery without angina pectoris: Secondary | ICD-10-CM | POA: Diagnosis not present

## 2016-05-02 MED ORDER — HYDROCODONE-ACETAMINOPHEN 7.5-325 MG PO TABS: 1.0000 | ORAL_TABLET | Freq: Four times a day (QID) | ORAL | 0 refills | Status: DC | PRN

## 2016-05-02 NOTE — Progress Notes (Signed)
Pre visit review using our clinic review tool, if applicable. No additional management support is needed unless otherwise documented below in the visit note. 

## 2016-05-02 NOTE — Patient Instructions (Signed)
BEFORE YOU LEAVE: -follow up: with PCP in 3-4 weeks  Medication refilled. Please see Dr. Amador CunasKwiatkowski for any further refills.

## 2016-05-02 NOTE — Progress Notes (Signed)
HPI:  Christopher Dalton is a pleasant 75 year old here for an acute visit for medication refill. He reports he takes hydrocodone for his chronic back pain. His pain is chronic in the right lower back and is unchanged. He denies any worsening, weakness, numbness or changes in bowel or bladder function. Reports he has taken this medication for quite some time and does require the medication 4 times daily to help with the pain. He reports he is not candidate for other treatments for his back pain. His PCP has been refilling this medication, but he was told he needed an appointment for this refill. He only has 4 more tablets left. He denies any constipation, altered mental status, recent falls or any tolerability issues with the medication.   ROS: See pertinent positives and negatives per HPI.  Past Medical History:  Diagnosis Date  . C A D 11/15/2009  . COPD 01/21/2007  . DEPRESSION 11/08/2009  . GERD (gastroesophageal reflux disease)   . HIP PAIN, LEFT 10/29/2008  . HYPERTENSION 01/21/2007  . HYPOTENSION 04/13/2009  . IMPAIRED GLUCOSE TOLERANCE 03/02/2008  . INGUINAL HERNIA, RIGHT 03/25/2007  . LOW BACK PAIN 01/21/2007  . OSTEOARTHRITIS 01/21/2007    Past Surgical History:  Procedure Laterality Date  . BOWEL RESECTION  03/28/2016   Procedure: SMALL BOWEL RESECTION;  Surgeon: Axel FillerArmando Ramirez, MD;  Location: [facility] OR;  Service: General;;  . FRACTURE SURGERY  1958   face  . INGUINAL HERNIA REPAIR  2008   right  . INGUINAL HERNIA REPAIR  03/28/2016   INCARCERATED  . INGUINAL HERNIA REPAIR Right 03/28/2016   Procedure: LAPAROSCOPIC  INGUINAL HERNIA;  Surgeon: Axel FillerArmando Ramirez, MD;  Location: [facility] OR;  Service: General;  Laterality: Right;  RIGHT INGUINAL HERNIA REPAIR   . LUMBAR LAMINECTOMY/DECOMPRESSION MICRODISCECTOMY Left 01/28/2014   Procedure: Complete DECOMPRESSION lumbar laminectomy for spinal stenosis,  MICRODISCECTOMY  L3-4 ON LEFT for foraminotomy for L3-L4 nerve root;  Surgeon: Jacki Conesonald A Gioffre, MD;   Location: [facility];  Service: Orthopedics;  Laterality: Left;  . RECONSTRUCTION MID-FACE    . TONSILLECTOMY  unknown    Family History  Problem Relation Age of Onset  . Cancer Mother     lung ca  . Cancer Father     unclear type    Social History   Social History  . Marital status: Married    Spouse name: N/A  . Number of children: N/A  . Years of education: N/A   Social History Main Topics  . Smoking status: Former Games developermoker  . Smokeless tobacco: Former NeurosurgeonUser  . Alcohol use Yes     Comment: 1-2 beers  week  . Drug use: No  . Sexual activity: Not Asked   Other Topics Concern  . None   Social History Narrative  . None     Current Outpatient Prescriptions:  .  benazepril-hydrochlorthiazide (LOTENSIN HCT) 20-12.5 MG tablet, TAKE 1 TABLET BY MOUTH DAILY., Disp: 90 tablet, Rfl: 3 .  CRANBERRY PO, Take 1 tablet by mouth daily., Disp: , Rfl:  .  diazepam (VALIUM) 5 MG tablet, Take 1 tablet (5 mg total) by mouth at bedtime as needed. (Patient taking differently: Take 5 mg by mouth at bedtime as needed for anxiety. ), Disp: 90 tablet, Rfl: 0 .  docusate sodium (COLACE) 100 MG capsule, Take 1 capsule (100 mg total) by mouth 2 (two) times daily as needed for mild constipation., Disp: 20 capsule, Rfl: 1 .  loratadine (CLARITIN) 10 MG tablet, Take 10 mg  by mouth daily.  , Disp: , Rfl:  .  metFORMIN (GLUCOPHAGE) 500 MG tablet, TAKE 1 TABLET (500 MG TOTAL) BY MOUTH 2 (TWO) TIMES DAILY WITH A MEAL., Disp: 180 tablet, Rfl: 2 .  Multiple Vitamin (MULTIVITAMIN WITH MINERALS) TABS tablet, Take 1 tablet by mouth daily., Disp: , Rfl:  .  naproxen (NAPROSYN) 375 MG tablet, Take 1 tablet (375 mg total) by mouth 2 (two) times daily., Disp: 20 tablet, Rfl: 0 .  predniSONE (DELTASONE) 5 MG tablet, 2.5 milligrams even days of the week (Patient taking differently: Take 2.5-5 mg by mouth See admin instructions. Take 2.5 mg by mouth on even days and take 5 mg by mouth on odd days.), Disp: 90 tablet, Rfl:  1 .  HYDROcodone-acetaminophen (NORCO) 7.5-325 MG tablet, Take 1 tablet by mouth every 6 (six) hours as needed. FURTHER REFILLS MUST come from PCP, Disp: 120 tablet, Rfl: 0  EXAM:  Vitals:   05/02/16 1051  BP: 110/70  Pulse: 92  Temp: 98.3 F (36.8 C)    Body mass index is 18.5 kg/m.  GENERAL: vitals reviewed and listed above, alert, oriented, appears well hydrated and in no acute distress  HEENT: atraumatic, conjunttiva clear, no obvious abnormalities on inspection of external nose and ears  NECK: no obvious masses on inspection  LUNGS: clear to auscultation bilaterally, no wheezes, rales or rhonchi, good air movement  CV: HRRR, no peripheral edema  MS: moves all extremities without noticeable abnormality Normal Gait Normal inspection of back, no obvious scoliosis or leg length descrepancy No bony TTP Soft tissue TTP at: Right lower lumbar paraspinal muscles and right SI joint region -/+ tests: neg trendelenburg,-facet loading, -SLRT, -CLRT, -FABER, -FADIR Normal muscle strength, sensation to light touch and DTRs in LEs bilaterally  PSYCH: pleasant and cooperative, no obvious depression or anxiety  ASSESSMENT AND PLAN:  Discussed the following assessment and plan:  Other chronic pain  -tolerating medical management well  -discussed with PCP after review of notes -PCP ok with 1 month refil for 4x per day of 7.5-325 - rx provided -advised further refills/follow up with PCP -Patient advised to return or notify a doctor immediately if symptoms worsen or persist or new concerns arise.  Patient Instructions  BEFORE YOU LEAVE: -follow up: with PCP in 3-4 weeks  Medication refilled. Please see Dr. Amador Cunas for any further refills.   Kriste Basque R., DO

## 2016-05-25 ENCOUNTER — Telehealth: Payer: Self-pay | Admitting: Internal Medicine

## 2016-05-25 NOTE — Telephone Encounter (Signed)
° ° °  Pt request refill of the following: ° °HYDROcodone-acetaminophen (NORCO) 7.5-325 MG tablet ° ° °Phamacy: °

## 2016-05-31 MED ORDER — HYDROCODONE-ACETAMINOPHEN 7.5-325 MG PO TABS: 1.0000 | ORAL_TABLET | Freq: Four times a day (QID) | ORAL | 0 refills | Status: DC | PRN

## 2016-05-31 NOTE — Telephone Encounter (Signed)
Pt's wife here to pickup Rx. Rx printed and signed

## 2016-06-04 ENCOUNTER — Other Ambulatory Visit: Payer: Self-pay | Admitting: Internal Medicine

## 2016-06-09 ENCOUNTER — Telehealth: Payer: Self-pay | Admitting: Internal Medicine

## 2016-06-09 NOTE — Telephone Encounter (Signed)
Spoke to pt's wife Steve Rattlerell, told her pt is not due for new Rx till 12/21. Nell verbalized understanding.

## 2016-06-09 NOTE — Telephone Encounter (Signed)
° ° °  Pt request refill of the following: ° °HYDROcodone-acetaminophen (NORCO) 7.5-325 MG tablet ° ° °Phamacy: °

## 2016-06-27 NOTE — Telephone Encounter (Signed)
Please call pt when rx is ready on 06/29/16

## 2016-06-28 ENCOUNTER — Other Ambulatory Visit: Payer: Self-pay | Admitting: Internal Medicine

## 2016-06-28 MED ORDER — HYDROCODONE-ACETAMINOPHEN 7.5-325 MG PO TABS: 1.0000 | ORAL_TABLET | Freq: Four times a day (QID) | ORAL | 0 refills | Status: DC | PRN

## 2016-06-28 NOTE — Telephone Encounter (Signed)
Called pt to inform that RX is ready for pick up. Pt not home per wife, will call back later.

## 2016-06-28 NOTE — Telephone Encounter (Signed)
Pt notified Rx ready for pickup. Rx printed and signed.  

## 2016-07-14 ENCOUNTER — Encounter: Payer: Self-pay | Admitting: Internal Medicine

## 2016-07-14 ENCOUNTER — Ambulatory Visit (INDEPENDENT_AMBULATORY_CARE_PROVIDER_SITE_OTHER): Payer: Medicare Other | Admitting: Internal Medicine

## 2016-07-14 VITALS — BP 158/66 | HR 75 | Temp 97.9°F | Ht 75.0 in | Wt 153.6 lb

## 2016-07-14 DIAGNOSIS — G8929 Other chronic pain: Secondary | ICD-10-CM

## 2016-07-14 DIAGNOSIS — E119 Type 2 diabetes mellitus without complications: Secondary | ICD-10-CM

## 2016-07-14 DIAGNOSIS — M150 Primary generalized (osteo)arthritis: Secondary | ICD-10-CM

## 2016-07-14 DIAGNOSIS — M48062 Spinal stenosis, lumbar region with neurogenic claudication: Secondary | ICD-10-CM

## 2016-07-14 DIAGNOSIS — M545 Low back pain, unspecified: Secondary | ICD-10-CM

## 2016-07-14 DIAGNOSIS — M159 Polyosteoarthritis, unspecified: Secondary | ICD-10-CM

## 2016-07-14 DIAGNOSIS — I10 Essential (primary) hypertension: Secondary | ICD-10-CM | POA: Diagnosis not present

## 2016-07-14 LAB — HEMOGLOBIN A1C: HEMOGLOBIN A1C: 6 % (ref 4.6–6.5)

## 2016-07-14 MED ORDER — DIAZEPAM 5 MG PO TABS: 5.0000 mg | ORAL_TABLET | Freq: Every evening | ORAL | 0 refills | Status: DC | PRN

## 2016-07-14 MED ORDER — HYDROCODONE-ACETAMINOPHEN 7.5-325 MG PO TABS: 1.0000 | ORAL_TABLET | Freq: Four times a day (QID) | ORAL | 0 refills | Status: DC | PRN

## 2016-07-14 NOTE — Progress Notes (Signed)
Subjective:    Patient ID: Christopher Dalton, male    DOB: March 29, 1941, 76 y.o.   MRN: [id number]  HPI  76 year old patient who is seen today for follow-up.  He has a history of essential hypertension and type 2 diabetes which has been controlled with  Metformin He is doing well except for his chronic low back pain.  He states that he has been out of his analgesics for some time  He was seen here in  October and given a one-month refill. No other concerns or complaints Since his last visit here, he has had elective inguinal hernia repair  Lab Results  Component Value Date   HGBA1C 6.1 12/08/2015    Past Medical History:  Diagnosis Date  . C A D 11/15/2009  . COPD 01/21/2007  . DEPRESSION 11/08/2009  . GERD (gastroesophageal reflux disease)   . HIP PAIN, LEFT 10/29/2008  . HYPERTENSION 01/21/2007  . HYPOTENSION 04/13/2009  . IMPAIRED GLUCOSE TOLERANCE 03/02/2008  . INGUINAL HERNIA, RIGHT 03/25/2007  . LOW BACK PAIN 01/21/2007  . OSTEOARTHRITIS 01/21/2007     Social History   Social History  . Marital status: Married    Spouse name: N/A  . Number of children: N/A  . Years of education: N/A   Occupational History  . Not on file.   Social History Main Topics  . Smoking status: Former Games developer  . Smokeless tobacco: Former Neurosurgeon  . Alcohol use Yes     Comment: 1-2 beers  week  . Drug use: No  . Sexual activity: Not on file   Other Topics Concern  . Not on file   Social History Narrative  . No narrative on file    Past Surgical History:  Procedure Laterality Date  . BOWEL RESECTION  03/28/2016   Procedure: SMALL BOWEL RESECTION;  Surgeon: Axel Filler, MD;  Location: [facility];  Service: General;;  . FRACTURE SURGERY  1958   face  . INGUINAL HERNIA REPAIR  2008   right  . INGUINAL HERNIA REPAIR  03/28/2016   INCARCERATED  . INGUINAL HERNIA REPAIR Right 03/28/2016   Procedure: LAPAROSCOPIC  INGUINAL HERNIA;  Surgeon: Axel Filler, MD;  Location: [facility];  Service: General;   Laterality: Right;  RIGHT INGUINAL HERNIA REPAIR   . LUMBAR LAMINECTOMY/DECOMPRESSION MICRODISCECTOMY Left 01/28/2014   Procedure: Complete DECOMPRESSION lumbar laminectomy for spinal stenosis,  MICRODISCECTOMY  L3-4 ON LEFT for foraminotomy for L3-L4 nerve root;  Surgeon: Jacki Cones, MD;  Location: [facility];  Service: Orthopedics;  Laterality: Left;  . RECONSTRUCTION MID-FACE    . TONSILLECTOMY  unknown    Family History  Problem Relation Age of Onset  . Cancer Mother     lung ca  . Cancer Father     unclear type    Allergies  Allergen Reactions  . Barbiturates Swelling and Other (See Comments)    tongue and hands swell  . Amoxicillin Other (See Comments)    Unknown  . Doxycycline Other (See Comments)    unknown  . Penicillins Itching and Swelling  . Pravastatin Other (See Comments)    Unknown  . Statins Other (See Comments)    mucsle pain  . Sulfamethoxazole Other (See Comments)    REACTION: unspecified    Current Outpatient Prescriptions on File Prior to Visit  Medication Sig Dispense Refill  . benazepril-hydrochlorthiazide (LOTENSIN HCT) 20-12.5 MG tablet TAKE 1 TABLET BY MOUTH DAILY. 90 tablet 1  . CRANBERRY PO Take 1 tablet by mouth  daily.    . diazepam (VALIUM) 5 MG tablet Take 1 tablet (5 mg total) by mouth at bedtime as needed. (Patient taking differently: Take 5 mg by mouth at bedtime as needed for anxiety. ) 90 tablet 0  . docusate sodium (COLACE) 100 MG capsule Take 1 capsule (100 mg total) by mouth 2 (two) times daily as needed for mild constipation. 20 capsule 1  . HYDROcodone-acetaminophen (NORCO) 7.5-325 MG tablet Take 1 tablet by mouth every 6 (six) hours as needed. 120 tablet 0  . loratadine (CLARITIN) 10 MG tablet Take 10 mg by mouth daily.      . metFORMIN (GLUCOPHAGE) 500 MG tablet TAKE 1 TABLET (500 MG TOTAL) BY MOUTH 2 (TWO) TIMES DAILY WITH A MEAL. 180 tablet 2  . Multiple Vitamin (MULTIVITAMIN WITH MINERALS) TABS tablet Take 1 tablet by mouth  daily.    . naproxen (NAPROSYN) 375 MG tablet Take 1 tablet (375 mg total) by mouth 2 (two) times daily. 20 tablet 0  . predniSONE (DELTASONE) 5 MG tablet 2.5 milligrams even days of the week (Patient taking differently: Take 2.5-5 mg by mouth See admin instructions. Take 2.5 mg by mouth on even days and take 5 mg by mouth on odd days.) 90 tablet 1   No current facility-administered medications on file prior to visit.     BP (!) 158/66 (BP Location: Right Arm, Patient Position: Sitting, Cuff Size: Normal)   Pulse 75   Temp 97.9 F (36.6 C) (Oral)   Ht 6\' 3"  (1.905 m)   Wt 153 lb 9.6 oz (69.7 kg)   SpO2 95%   BMI 19.20 kg/m     Review of Systems  Constitutional: Negative for appetite change, chills, fatigue and fever.  HENT: Negative for congestion, dental problem, ear pain, hearing loss, sore throat, tinnitus, trouble swallowing and voice change.   Eyes: Negative for pain, discharge and visual disturbance.  Respiratory: Negative for cough, chest tightness, wheezing and stridor.   Cardiovascular: Negative for chest pain, palpitations and leg swelling.  Gastrointestinal: Negative for abdominal distention, abdominal pain, blood in stool, constipation, diarrhea, nausea and vomiting.  Genitourinary: Negative for difficulty urinating, discharge, flank pain, genital sores, hematuria and urgency.  Musculoskeletal: Positive for arthralgias and back pain. Negative for gait problem, joint swelling, myalgias and neck stiffness.  Skin: Negative for rash.  Neurological: Negative for dizziness, syncope, speech difficulty, weakness, numbness and headaches.  Hematological: Negative for adenopathy. Does not bruise/bleed easily.  Psychiatric/Behavioral: Negative for behavioral problems and dysphoric mood. The patient is not nervous/anxious.        Objective:   Physical Exam  Constitutional: He is oriented to person, place, and time. He appears well-developed.  Blood pressure 138/72  HENT:  Head:  Normocephalic.  Right Ear: External ear normal.  Left Ear: External ear normal.  Dentures Hard of hearing  Eyes: Conjunctivae and EOM are normal.  Neck: Normal range of motion.  Cardiovascular: Normal rate and normal heart sounds.   Pulmonary/Chest: Breath sounds normal.  Abdominal: Bowel sounds are normal.  Musculoskeletal: Normal range of motion. He exhibits no edema or tenderness.  Neurological: He is alert and oriented to person, place, and time.  Psychiatric: He has a normal mood and affect. His behavior is normal.          Assessment & Plan:   Essential hypertension Diabetes mellitus.  Will check hemoglobin A1c Chronic low back pain.  Analgesics refilled  CPX 4 months  Rogelia BogaKWIATKOWSKI,PETER FRANK

## 2016-07-14 NOTE — Progress Notes (Signed)
Pre visit review using our clinic review tool, if applicable. No additional management support is needed unless otherwise documented below in the visit note. 

## 2016-07-14 NOTE — Patient Instructions (Signed)
Limit your sodium (Salt) intake   Please check your hemoglobin A1c every 3-6  Months    It is important that you exercise regularly, at least 20 minutes 3 to 4 times per week.  If you develop chest pain or shortness of breath seek  medical attention.  Return in 4 months for follow-up  

## 2016-07-19 ENCOUNTER — Telehealth: Payer: Self-pay | Admitting: Internal Medicine

## 2016-07-19 NOTE — Telephone Encounter (Signed)
Pharmacy is calling to see if the pt can get a early refill on his Hydrocodone he picked up it up on 12/20 per pharmacist.  So can the pt get it early?

## 2016-07-19 NOTE — Telephone Encounter (Signed)
Spoke to pt and informed him that we are unable to refill Rx early. Informed pt that Rx will be ready on  Monday (07/31/16) Pt verbalized understanding.

## 2016-07-19 NOTE — Telephone Encounter (Signed)
Please advise 

## 2016-07-19 NOTE — Telephone Encounter (Signed)
No early refill.  

## 2016-07-21 ENCOUNTER — Encounter (HOSPITAL_COMMUNITY): Payer: Self-pay | Admitting: *Deleted

## 2016-07-21 ENCOUNTER — Ambulatory Visit (INDEPENDENT_AMBULATORY_CARE_PROVIDER_SITE_OTHER): Payer: Medicare Other

## 2016-07-21 ENCOUNTER — Ambulatory Visit (HOSPITAL_COMMUNITY)
Admission: EM | Admit: 2016-07-21 | Discharge: 2016-07-21 | Disposition: A | Discharge status: Home / Self Care | Payer: Medicare Other | Attending: Family Medicine | Admitting: Family Medicine

## 2016-07-21 DIAGNOSIS — R1084 Generalized abdominal pain: Secondary | ICD-10-CM | POA: Diagnosis not present

## 2016-07-21 LAB — POCT URINALYSIS DIP (DEVICE)
Glucose, UA: NEGATIVE mg/dL
Hgb urine dipstick: NEGATIVE
KETONES UR: 15 mg/dL — AB
Leukocytes, UA: NEGATIVE
Nitrite: NEGATIVE
PH: 5.5 (ref 5.0–8.0)
Protein, ur: 30 mg/dL — AB
Specific Gravity, Urine: >=1.03 (ref 1.005–1.030)
Urobilinogen, UA: 0.2 mg/dL (ref 0.0–1.0)

## 2016-07-21 MED ORDER — GI COCKTAIL ~~LOC~~
30.0000 mL | Freq: Once | ORAL | Status: AC
  Administered 2016-07-21: 30 mL via ORAL

## 2016-07-21 MED ORDER — TRAMADOL HCL 50 MG PO TABS: 50.0000 mg | ORAL_TABLET | Freq: Four times a day (QID) | ORAL | 0 refills | Status: DC | PRN

## 2016-07-21 MED ORDER — GI COCKTAIL ~~LOC~~
ORAL | Status: AC
  Filled 2016-07-21: qty 30

## 2016-07-21 NOTE — ED Triage Notes (Signed)
Pt  Reports   Pain  In  Upper  abd    Radiating  To  Back   With  Onset  For  Quite a  While    Worse  Today   Subsided   Somewhat  wtat  Today    Pt  Had  Hernia   Surgery  2  Months  Ago    Seen  X  1  Since  The   Surgery   No   Vomiting  Today

## 2016-07-21 NOTE — Discharge Instructions (Signed)
Return to ER if pain gets worse.diet and activity as tolerated.

## 2016-07-21 NOTE — ED Provider Notes (Signed)
[facility]    CSN: [id number] Arrival date & time: 07/21/16  1102     History   Chief Complaint Chief Complaint  Patient presents with  . Abdominal Pain    HPI Christopher Dalton is a 76 y.o. male.   The history is provided by the patient and the spouse.  Abdominal Pain  Pain location:  Generalized Pain quality: cramping   Pain radiates to:  Back Pain severity:  Moderate Onset quality:  Gradual Duration:  1 day Progression:  Resolved Chronicity:  Recurrent Context: previous surgery   Context comment:  S/p hernia surg 1 mo ago, resolved. Relieved by:  None tried Ineffective treatments:  None tried Associated symptoms: no constipation, no diarrhea, no fever and no nausea     Past Medical History:  Diagnosis Date  . C A D 11/15/2009  . COPD 01/21/2007  . DEPRESSION 11/08/2009  . GERD (gastroesophageal reflux disease)   . HIP PAIN, LEFT 10/29/2008  . HYPERTENSION 01/21/2007  . HYPOTENSION 04/13/2009  . IMPAIRED GLUCOSE TOLERANCE 03/02/2008  . INGUINAL HERNIA, RIGHT 03/25/2007  . LOW BACK PAIN 01/21/2007  . OSTEOARTHRITIS 01/21/2007    Patient Active Problem List   Diagnosis Date Noted  . S/P hernia repair 03/29/2016  . Hernia, inguinal, right 03/28/2016  . Statin intolerance 12/08/2015  . Diabetes mellitus without complication (HCC) 02/23/2014  . 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 Coronary atherosclerosis 11/15/2009  . DEPRESSION 11/08/2009  . ELECTROCARDIOGRAM, ABNORMAL 11/08/2009  . NOSEBLEED 05/17/2009  . Hypotension 04/13/2009  . SHINGLES 04/02/2009  . HIP PAIN, LEFT 10/29/2008  . INGUINAL HERNIA, RIGHT 03/25/2007  . Essential hypertension 01/21/2007  . Allergic rhinitis 01/21/2007  . COPD exacerbation (HCC) 01/21/2007  . Osteoarthritis 01/21/2007  . LOW BACK PAIN 01/21/2007    Past Surgical History:  Procedure Laterality Date  . BOWEL RESECTION  03/28/2016   Procedure: SMALL BOWEL RESECTION;  Surgeon:  Axel Filler, MD;  Location: [facility];  Service: General;;  . FRACTURE SURGERY  1958   face  . INGUINAL HERNIA REPAIR  2008   right  . INGUINAL HERNIA REPAIR  03/28/2016   INCARCERATED  . INGUINAL HERNIA REPAIR Right 03/28/2016   Procedure: LAPAROSCOPIC  INGUINAL HERNIA;  Surgeon: Axel Filler, MD;  Location: [facility];  Service: General;  Laterality: Right;  RIGHT INGUINAL HERNIA REPAIR   . LUMBAR LAMINECTOMY/DECOMPRESSION MICRODISCECTOMY Left 01/28/2014   Procedure: Complete DECOMPRESSION lumbar laminectomy for spinal stenosis,  MICRODISCECTOMY  L3-4 ON LEFT for foraminotomy for L3-L4 nerve root;  Surgeon: Jacki Cones, MD;  Location: [facility];  Service: Orthopedics;  Laterality: Left;  . RECONSTRUCTION MID-FACE    . TONSILLECTOMY  unknown       Home Medications    Prior to Admission medications   Medication Sig Start Date End Date Taking? Authorizing Provider  benazepril-hydrochlorthiazide (LOTENSIN HCT) 20-12.5 MG tablet TAKE 1 TABLET BY MOUTH DAILY. 06/05/16   Gordy Savers, MD  CRANBERRY PO Take 1 tablet by mouth daily.    Historical Provider, MD  diazepam (VALIUM) 5 MG tablet Take 1 tablet (5 mg total) by mouth at bedtime as needed. 07/14/16   Gordy Savers, MD  docusate sodium (COLACE) 100 MG capsule Take 1 capsule (100 mg total) by mouth 2 (two) times daily as needed for mild constipation. 01/31/14   Jene Every, MD  HYDROcodone-acetaminophen (NORCO) 7.5-325 MG tablet Take 1 tablet by mouth every 6 (six) hours as needed. 07/14/16   Theron Arista  Lysle DingwallF Kwiatkowski, MD  loratadine (CLARITIN) 10 MG tablet Take 10 mg by mouth daily.      Historical Provider, MD  metFORMIN (GLUCOPHAGE) 500 MG tablet TAKE 1 TABLET (500 MG TOTAL) BY MOUTH 2 (TWO) TIMES DAILY WITH A MEAL. 03/08/16   Gordy SaversPeter F Kwiatkowski, MD  Multiple Vitamin (MULTIVITAMIN WITH MINERALS) TABS tablet Take 1 tablet by mouth daily.    Historical Provider, MD  naproxen (NAPROSYN) 375 MG tablet Take 1 tablet (375 mg total) by  mouth 2 (two) times daily. 02/04/16   Shaune Pollackameron Isaacs, MD    Family History Family History  Problem Relation Age of Onset  . Cancer Mother     lung ca  . Cancer Father     unclear type    Social History Social History  Substance Use Topics  . Smoking status: Former Games developermoker  . Smokeless tobacco: Former NeurosurgeonUser  . Alcohol use Yes     Comment: 1-2 beers  week     Allergies   Barbiturates; Amoxicillin; Doxycycline; Penicillins; Pravastatin; Statins; and Sulfamethoxazole   Review of Systems Review of Systems  Constitutional: Negative for fever.  Gastrointestinal: Positive for abdominal pain. Negative for constipation, diarrhea and nausea.     Physical Exam Triage Vital Signs ED Triage Vitals  Enc Vitals Group     BP 07/21/16 1112 162/79     Pulse Rate 07/21/16 1112 87     Resp 07/21/16 1112 16     Temp 07/21/16 1112 98.7 F (37.1 C)     Temp Source 07/21/16 1112 Oral     SpO2 07/21/16 1112 98 %     Weight --      Height --      Head Circumference --      Peak Flow --      Pain Score 07/21/16 1117 0     Pain Loc --      Pain Edu? --      Excl. in GC? --    No data found.   Updated Vital Signs BP 162/79 (BP Location: Left Arm)   Pulse 87   Temp 98.7 F (37.1 C) (Oral)   Resp 16   SpO2 98%   Visual Acuity Right Eye Distance:   Left Eye Distance:   Bilateral Distance:    Right Eye Near:   Left Eye Near:    Bilateral Near:     Physical Exam  Constitutional: He appears well-developed and well-nourished. No distress.  HENT:  Mouth/Throat: Oropharynx is clear and moist.  Cardiovascular: Normal rate.   Pulmonary/Chest: Effort normal.  Abdominal: Soft. He exhibits no distension. Bowel sounds are decreased. There is generalized tenderness.  Skin: Skin is warm and dry.  Nursing note and vitals reviewed.    UC Treatments / Results  Labs (all labs ordered are listed, but only abnormal results are displayed) Labs Reviewed - No data to display  EKG  EKG  Interpretation None       Radiology No results found. X-rays reviewed and report per radiologist.  Procedures Procedures (including critical care time)  Medications Ordered in UC Medications - No data to display   Initial Impression / Assessment and Plan / UC Course  I have reviewed the triage vital signs and the nursing notes.  Pertinent labs & imaging results that were available during my care of the patient were reviewed by me and considered in my medical decision making (see chart for details).  Clinical Course       Final  Clinical Impressions(s) / UC Diagnoses   Final diagnoses:  None    New Prescriptions New Prescriptions   No medications on file     Linna Hoff, MD 07/21/16 1151

## 2016-08-21 ENCOUNTER — Telehealth: Payer: Self-pay | Admitting: Internal Medicine

## 2016-08-21 DIAGNOSIS — M48062 Spinal stenosis, lumbar region with neurogenic claudication: Secondary | ICD-10-CM

## 2016-08-21 MED ORDER — HYDROCODONE-ACETAMINOPHEN 7.5-325 MG PO TABS: 1.0000 | ORAL_TABLET | Freq: Four times a day (QID) | ORAL | 0 refills | Status: DC | PRN

## 2016-08-21 NOTE — Telephone Encounter (Signed)
Pt notified Rx ready for pickup. Rx printed and signed.  

## 2016-08-21 NOTE — Telephone Encounter (Signed)
Pt needs new rx hydrocodone °

## 2016-09-07 ENCOUNTER — Telehealth: Payer: Self-pay | Admitting: Internal Medicine

## 2016-09-07 DIAGNOSIS — M48062 Spinal stenosis, lumbar region with neurogenic claudication: Secondary | ICD-10-CM

## 2016-09-07 NOTE — Telephone Encounter (Signed)
Pt needs new rx hydrocodone °

## 2016-09-07 NOTE — Telephone Encounter (Signed)
Last refilled Rx on 08/21/16. Rx must last 30 DAYS. Too early to refill. Will print on September 20, 2016

## 2016-09-08 NOTE — Telephone Encounter (Signed)
Pt wife would like return call

## 2016-09-11 NOTE — Telephone Encounter (Signed)
Attempted to call pt's wife at [phone] but no answer and I was unable to leave a message

## 2016-09-20 ENCOUNTER — Telehealth: Payer: Self-pay | Admitting: Internal Medicine

## 2016-09-20 MED ORDER — HYDROCODONE-ACETAMINOPHEN 7.5-325 MG PO TABS: 1.0000 | ORAL_TABLET | Freq: Four times a day (QID) | ORAL | 0 refills | Status: DC | PRN

## 2016-09-20 NOTE — Addendum Note (Signed)
Addended by: Neville RouteJAIMES, Nivaan Dicenzo G on: 09/20/2016 11:19 AM   Modules accepted: Orders

## 2016-09-20 NOTE — Telephone Encounter (Signed)
Called pt x 3, no answer. Rx ready for pickup. Rx printed and signed.

## 2016-09-20 NOTE — Telephone Encounter (Signed)
Pt need new Rx for Hydrocodone  Pt is aware that Dr. Kirtland BouchardK is not in the office and of the 3 business days for refills our office will call when ready for pick-up.

## 2016-09-20 NOTE — Telephone Encounter (Signed)
Called pt no answer. Rx ready for pickup. Rx printed and signed.

## 2016-09-21 NOTE — Telephone Encounter (Signed)
Called pt and no answer.  

## 2016-09-21 NOTE — Telephone Encounter (Signed)
Called pt, no answer.Rx ready for pickup. Rx printed and signed.

## 2016-09-26 ENCOUNTER — Telehealth: Payer: Self-pay | Admitting: Internal Medicine

## 2016-09-26 NOTE — Telephone Encounter (Signed)
Christopher AyeKristy from [facility] called this AM and informed me that pt's Rx was filled today. Wife of pt was very aggressive stating that the RX was hers and that she "needed her medication earlyForensic psychologist" Pharmacy manager requested that on the next Rx  we state "do not fill until after 10/27/16"

## 2016-10-10 ENCOUNTER — Telehealth: Payer: Self-pay | Admitting: Internal Medicine

## 2016-10-10 NOTE — Telephone Encounter (Signed)
Called pt and made aware, pt verbalized understanding

## 2016-10-10 NOTE — Telephone Encounter (Signed)
Pt request refill  °HYDROcodone-acetaminophen (NORCO) 7.5-325 MG tablet °

## 2016-10-10 NOTE — Telephone Encounter (Signed)
Contacted pharmacy, it is too early to refill medication until after 10/27/16.

## 2016-10-27 ENCOUNTER — Ambulatory Visit (INDEPENDENT_AMBULATORY_CARE_PROVIDER_SITE_OTHER): Payer: Medicare Other | Admitting: Internal Medicine

## 2016-10-27 ENCOUNTER — Encounter: Payer: Self-pay | Admitting: Internal Medicine

## 2016-10-27 VITALS — BP 160/64 | HR 100 | Temp 98.2°F | Ht 75.0 in | Wt 158.0 lb

## 2016-10-27 DIAGNOSIS — M150 Primary generalized (osteo)arthritis: Secondary | ICD-10-CM

## 2016-10-27 DIAGNOSIS — M159 Polyosteoarthritis, unspecified: Secondary | ICD-10-CM

## 2016-10-27 DIAGNOSIS — I10 Essential (primary) hypertension: Secondary | ICD-10-CM | POA: Diagnosis not present

## 2016-10-27 DIAGNOSIS — M48062 Spinal stenosis, lumbar region with neurogenic claudication: Secondary | ICD-10-CM | POA: Diagnosis not present

## 2016-10-27 MED ORDER — HYDROCODONE-ACETAMINOPHEN 7.5-325 MG PO TABS: 1.0000 | ORAL_TABLET | Freq: Four times a day (QID) | ORAL | 0 refills | Status: DC | PRN

## 2016-10-27 NOTE — Progress Notes (Signed)
Pre visit review using our clinic review tool, if applicable. No additional management support is needed unless otherwise documented below in the visit note. 

## 2016-10-27 NOTE — Patient Instructions (Signed)
Limit your sodium (Salt) intake   Please check your hemoglobin A1c every 3-6  Months  Please check your blood pressure on a regular basis.  If it is consistently greater than 150/90, please make an office appointment.     

## 2016-10-27 NOTE — Progress Notes (Signed)
Subjective:    Patient ID: Christopher Dalton, male    DOB: 06-22-1941, 76 y.o.   MRN: [id number]  HPI  76 year old patient who has a history of chronic low back pain secondary to severe degenerative lumbar disc disease.  He states that he has had worsening low back pain over the past several months.  He also states that he has been out of his analgesic for some time.  Review of office notes reveals that he was giving #120 on March 14. He has a history of essential hypertension and diabetes.  Hemoglobin A1c's have been there a well controlled  Past Medical History:  Diagnosis Date  . C A D 11/15/2009  . COPD 01/21/2007  . DEPRESSION 11/08/2009  . GERD (gastroesophageal reflux disease)   . HIP PAIN, LEFT 10/29/2008  . HYPERTENSION 01/21/2007  . HYPOTENSION 04/13/2009  . IMPAIRED GLUCOSE TOLERANCE 03/02/2008  . INGUINAL HERNIA, RIGHT 03/25/2007  . LOW BACK PAIN 01/21/2007  . OSTEOARTHRITIS 01/21/2007     Social History   Social History  . Marital status: Married    Spouse name: N/A  . Number of children: N/A  . Years of education: N/A   Occupational History  . Not on file.   Social History Main Topics  . Smoking status: Former Games developer  . Smokeless tobacco: Former Neurosurgeon  . Alcohol use Yes     Comment: 1-2 beers  week  . Drug use: No  . Sexual activity: Not on file   Other Topics Concern  . Not on file   Social History Narrative  . No narrative on file    Past Surgical History:  Procedure Laterality Date  . BOWEL RESECTION  03/28/2016   Procedure: SMALL BOWEL RESECTION;  Surgeon: Axel Filler, MD;  Location: [facility];  Service: General;;  . FRACTURE SURGERY  1958   face  . INGUINAL HERNIA REPAIR  2008   right  . INGUINAL HERNIA REPAIR  03/28/2016   INCARCERATED  . INGUINAL HERNIA REPAIR Right 03/28/2016   Procedure: LAPAROSCOPIC  INGUINAL HERNIA;  Surgeon: Axel Filler, MD;  Location: [facility];  Service: General;  Laterality: Right;  RIGHT INGUINAL HERNIA REPAIR   . LUMBAR  LAMINECTOMY/DECOMPRESSION MICRODISCECTOMY Left 01/28/2014   Procedure: Complete DECOMPRESSION lumbar laminectomy for spinal stenosis,  MICRODISCECTOMY  L3-4 ON LEFT for foraminotomy for L3-L4 nerve root;  Surgeon: Jacki Cones, MD;  Location: [facility];  Service: Orthopedics;  Laterality: Left;  . RECONSTRUCTION MID-FACE    . TONSILLECTOMY  unknown    Family History  Problem Relation Age of Onset  . Cancer Mother     lung ca  . Cancer Father     unclear type    Allergies  Allergen Reactions  . Barbiturates Swelling and Other (See Comments)    tongue and hands swell  . Amoxicillin Other (See Comments)    Unknown  . Doxycycline Other (See Comments)    unknown  . Penicillins Itching and Swelling  . Pravastatin Other (See Comments)    Unknown  . Statins Other (See Comments)    mucsle pain  . Sulfamethoxazole Other (See Comments)    REACTION: unspecified    Current Outpatient Prescriptions on File Prior to Visit  Medication Sig Dispense Refill  . benazepril-hydrochlorthiazide (LOTENSIN HCT) 20-12.5 MG tablet TAKE 1 TABLET BY MOUTH DAILY. 90 tablet 1  . CRANBERRY PO Take 1 tablet by mouth daily.    . diazepam (VALIUM) 5 MG tablet Take 1 tablet (5 mg  total) by mouth at bedtime as needed. 90 tablet 0  . docusate sodium (COLACE) 100 MG capsule Take 1 capsule (100 mg total) by mouth 2 (two) times daily as needed for mild constipation. 20 capsule 1  . loratadine (CLARITIN) 10 MG tablet Take 10 mg by mouth daily.      . metFORMIN (GLUCOPHAGE) 500 MG tablet TAKE 1 TABLET (500 MG TOTAL) BY MOUTH 2 (TWO) TIMES DAILY WITH A MEAL. 180 tablet 2  . Multiple Vitamin (MULTIVITAMIN WITH MINERALS) TABS tablet Take 1 tablet by mouth daily.    . naproxen (NAPROSYN) 375 MG tablet Take 1 tablet (375 mg total) by mouth 2 (two) times daily. 20 tablet 0   No current facility-administered medications on file prior to visit.     BP (!) 160/64 (BP Location: Left Arm, Patient Position: Sitting, Cuff Size:  Normal)   Pulse 100   Temp 98.2 F (36.8 C) (Oral)   Ht  (1.905 m)   Wt 158 lb (71.7 kg)   SpO2 98%   BMI 19.75 kg/m     Review of Systems  Constitutional: Negative for appetite change, chills, fatigue and fever.  HENT: Negative for congestion, dental problem, ear pain, hearing loss, sore throat, tinnitus, trouble swallowing and voice change.   Eyes: Negative for pain, discharge and visual disturbance.  Respiratory: Negative for cough, chest tightness, wheezing and stridor.   Cardiovascular: Negative for chest pain, palpitations and leg swelling.  Gastrointestinal: Negative for abdominal distention, abdominal pain, blood in stool, constipation, diarrhea, nausea and vomiting.  Genitourinary: Negative for difficulty urinating, discharge, flank pain, genital sores, hematuria and urgency.  Musculoskeletal: Positive for back pain. Negative for arthralgias, gait problem, joint swelling, myalgias and neck stiffness.  Skin: Negative for rash.  Neurological: Negative for dizziness, syncope, speech difficulty, weakness, numbness and headaches.  Hematological: Negative for adenopathy. Does not bruise/bleed easily.  Psychiatric/Behavioral: Negative for behavioral problems and dysphoric mood. The patient is not nervous/anxious.        Objective:   Physical Exam  Constitutional: He appears well-developed and well-nourished. No distress.  Cardiovascular: Normal rate and regular rhythm.   Pulmonary/Chest: Effort normal.  Diminished breath sounds          Assessment & Plan:   Chronic low back pain secondary to advanced degenerative lumbar disc disease Diabetes mellitus Essential hypertension Chronic narcotic use.  Drug-seeking behavior.  Medications dispensed Compliance with medications.  Discussed; new expected mandates also discussed  Recheck 3 months with hemoglobin A1c  Rogelia Boga

## 2016-11-06 ENCOUNTER — Encounter: Payer: Self-pay | Admitting: Internal Medicine

## 2016-11-06 ENCOUNTER — Ambulatory Visit (INDEPENDENT_AMBULATORY_CARE_PROVIDER_SITE_OTHER): Payer: Medicare Other | Admitting: Internal Medicine

## 2016-11-06 VITALS — BP 140/68 | HR 81 | Temp 98.3°F

## 2016-11-06 DIAGNOSIS — M545 Low back pain, unspecified: Secondary | ICD-10-CM

## 2016-11-06 DIAGNOSIS — M48062 Spinal stenosis, lumbar region with neurogenic claudication: Secondary | ICD-10-CM

## 2016-11-06 DIAGNOSIS — G8929 Other chronic pain: Secondary | ICD-10-CM

## 2016-11-06 DIAGNOSIS — E119 Type 2 diabetes mellitus without complications: Secondary | ICD-10-CM | POA: Diagnosis not present

## 2016-11-06 DIAGNOSIS — R42 Dizziness and giddiness: Secondary | ICD-10-CM

## 2016-11-06 DIAGNOSIS — I2510 Atherosclerotic heart disease of native coronary artery without angina pectoris: Secondary | ICD-10-CM | POA: Diagnosis not present

## 2016-11-06 DIAGNOSIS — I10 Essential (primary) hypertension: Secondary | ICD-10-CM

## 2016-11-06 LAB — GLUCOSE, POCT (MANUAL RESULT ENTRY): POC GLUCOSE: 136 mg/dL — AB (ref 70–99)

## 2016-11-06 NOTE — Progress Notes (Signed)
Subjective:    Patient ID: Christopher Dalton, male    DOB: [birth year], 76 y.o.   MRN: [id number]  HPI 76 year old patient who was seen earlier this month.  He has chronic low back pain and lumbar spinal stenosis and analgesics were refilled. He presents with complain of "not feeling well" and 1 or 2 days of a sense of being cold.  Denies any fever, chills, sore throat, sinus or pulmonary symptoms. On arrival, the patient was quite belligerent, yelling at the staff and not being cooperative with checking vital signs  Past Medical History:  Diagnosis Date  . C A D 11/15/2009  . COPD 01/21/2007  . DEPRESSION 11/08/2009  . GERD (gastroesophageal reflux disease)   . HIP PAIN, LEFT 10/29/2008  . HYPERTENSION 01/21/2007  . HYPOTENSION 04/13/2009  . IMPAIRED GLUCOSE TOLERANCE 03/02/2008  . INGUINAL HERNIA, RIGHT 03/25/2007  . LOW BACK PAIN 01/21/2007  . OSTEOARTHRITIS 01/21/2007     Social History   Social History  . Marital status: Married    Spouse name: N/A  . Number of children: N/A  . Years of education: N/A   Occupational History  . Not on file.   Social History Main Topics  . Smoking status: Former Games developer  . Smokeless tobacco: Former Neurosurgeon  . Alcohol use Yes     Comment: 1-2 beers  week  . Drug use: No  . Sexual activity: Not on file   Other Topics Concern  . Not on file   Social History Narrative  . No narrative on file    Past Surgical History:  Procedure Laterality Date  . BOWEL RESECTION  03/28/2016   Procedure: SMALL BOWEL RESECTION;  Surgeon: Axel Filler, MD;  Location: [facility];  Service: General;;  . FRACTURE SURGERY  1958   face  . INGUINAL HERNIA REPAIR  2008   right  . INGUINAL HERNIA REPAIR  03/28/2016   INCARCERATED  . INGUINAL HERNIA REPAIR Right 03/28/2016   Procedure: LAPAROSCOPIC  INGUINAL HERNIA;  Surgeon: Axel Filler, MD;  Location: [facility];  Service: General;  Laterality: Right;  RIGHT INGUINAL HERNIA REPAIR   . LUMBAR LAMINECTOMY/DECOMPRESSION  MICRODISCECTOMY Left 01/28/2014   Procedure: Complete DECOMPRESSION lumbar laminectomy for spinal stenosis,  MICRODISCECTOMY  L3-4 ON LEFT for foraminotomy for L3-L4 nerve root;  Surgeon: Jacki Cones, MD;  Location: [facility];  Service: Orthopedics;  Laterality: Left;  . RECONSTRUCTION MID-FACE    . TONSILLECTOMY  unknown    Family History  Problem Relation Age of Onset  . Cancer Mother     lung ca  . Cancer Father     unclear type    Allergies  Allergen Reactions  . Barbiturates Swelling and Other (See Comments)    tongue and hands swell  . Amoxicillin Other (See Comments)    Unknown  . Doxycycline Other (See Comments)    unknown  . Penicillins Itching and Swelling  . Pravastatin Other (See Comments)    Unknown  . Statins Other (See Comments)    mucsle pain  . Sulfamethoxazole Other (See Comments)    REACTION: unspecified    Current Outpatient Prescriptions on File Prior to Visit  Medication Sig Dispense Refill  . benazepril-hydrochlorthiazide (LOTENSIN HCT) 20-12.5 MG tablet TAKE 1 TABLET BY MOUTH DAILY. 90 tablet 1  . CRANBERRY PO Take 1 tablet by mouth daily.    . diazepam (VALIUM) 5 MG tablet Take 1 tablet (5 mg total) by mouth at bedtime as needed. 90 tablet 0  .  docusate sodium (COLACE) 100 MG capsule Take 1 capsule (100 mg total) by mouth 2 (two) times daily as needed for mild constipation. 20 capsule 1  . HYDROcodone-acetaminophen (NORCO) 7.5-325 MG tablet Take 1 tablet by mouth every 6 (six) hours as needed. 120 tablet 0  . loratadine (CLARITIN) 10 MG tablet Take 10 mg by mouth daily.      . metFORMIN (GLUCOPHAGE) 500 MG tablet TAKE 1 TABLET (500 MG TOTAL) BY MOUTH 2 (TWO) TIMES DAILY WITH A MEAL. 180 tablet 2  . Multiple Vitamin (MULTIVITAMIN WITH MINERALS) TABS tablet Take 1 tablet by mouth daily.    . naproxen (NAPROSYN) 375 MG tablet Take 1 tablet (375 mg total) by mouth 2 (two) times daily. 20 tablet 0   No current facility-administered medications on file  prior to visit.     BP 140/68   Pulse 81   Temp 98.3 F (36.8 C) (Oral)   SpO2 98%      Review of Systems  Constitutional: Positive for activity change and appetite change. Negative for chills, fatigue and fever.  HENT: Negative for congestion, dental problem, ear pain, hearing loss, postnasal drip, rhinorrhea, sinus pain, sore throat, tinnitus, trouble swallowing and voice change.   Eyes: Negative for pain, discharge and visual disturbance.  Respiratory: Negative for cough, chest tightness, wheezing and stridor.   Cardiovascular: Negative for chest pain, palpitations and leg swelling.  Gastrointestinal: Negative for abdominal distention, abdominal pain, blood in stool, constipation, diarrhea, nausea and vomiting.  Genitourinary: Negative for difficulty urinating, discharge, flank pain, genital sores, hematuria and urgency.  Musculoskeletal: Positive for back pain. Negative for arthralgias, gait problem, joint swelling, myalgias and neck stiffness.  Skin: Negative for rash.  Neurological: Negative for dizziness, syncope, speech difficulty, weakness, numbness and headaches.  Hematological: Negative for adenopathy. Does not bruise/bleed easily.  Psychiatric/Behavioral: Negative for behavioral problems and dysphoric mood. The patient is not nervous/anxious.        Objective:   Physical Exam  Constitutional: He is oriented to person, place, and time. He appears well-developed.  HENT:  Head: Normocephalic.  Right Ear: External ear normal.  Left Ear: External ear normal.  Eyes: Conjunctivae and EOM are normal.  Neck: Normal range of motion.  Cardiovascular: Normal rate and normal heart sounds.   Pulmonary/Chest: Breath sounds normal.  Abdominal: Bowel sounds are normal.  Musculoskeletal: Normal range of motion. He exhibits no edema or tenderness.  Neurological: He is alert and oriented to person, place, and time.  Psychiatric: He has a normal mood and affect. His behavior is  normal.          Assessment & Plan:   Nonspecific complaints of sense of unwellness and sense of being cold.  Examination nonrevealing.  We'll check screening lab Chronic low back pain COPD stable Diabetes mellitus.  Well-controlled on present regimen  Christopher Dalton

## 2016-11-06 NOTE — Progress Notes (Signed)
Pre visit review using our clinic review tool, if applicable. No additional management support is needed unless otherwise documented below in the visit note. 

## 2016-11-06 NOTE — Patient Instructions (Signed)
Limit your sodium (Salt) intake  Please check your blood pressure on a regular basis.  If it is consistently greater than 150/90, please make an office appointment.  Call or return to clinic prn if these symptoms worsen or fail to improve as anticipated.  Return in 3 months for follow-up  

## 2016-11-07 ENCOUNTER — Other Ambulatory Visit: Payer: Self-pay

## 2016-11-07 DIAGNOSIS — E538 Deficiency of other specified B group vitamins: Secondary | ICD-10-CM

## 2016-11-07 DIAGNOSIS — D509 Iron deficiency anemia, unspecified: Secondary | ICD-10-CM

## 2016-11-07 LAB — COMPREHENSIVE METABOLIC PANEL
ALK PHOS: 51 U/L (ref 39–117)
ALT: 16 U/L (ref 0–53)
AST: 18 U/L (ref 0–37)
Albumin: 4.3 g/dL (ref 3.5–5.2)
BUN: 17 mg/dL (ref 6–23)
CHLORIDE: 102 meq/L (ref 96–112)
CO2: 27 mEq/L (ref 19–32)
CREATININE: 0.99 mg/dL (ref 0.40–1.50)
Calcium: 9.8 mg/dL (ref 8.4–10.5)
GFR: 77.69 mL/min (ref >60.00)
GLUCOSE: 109 mg/dL — AB (ref 70–99)
POTASSIUM: 4.2 meq/L (ref 3.5–5.1)
SODIUM: 137 meq/L (ref 135–145)
TOTAL PROTEIN: 6.7 g/dL (ref 6.0–8.3)
Total Bilirubin: 0.3 mg/dL (ref 0.2–1.2)

## 2016-11-07 LAB — CBC WITH DIFFERENTIAL/PLATELET
BASOS ABS: 0 10*3/uL (ref 0.0–0.1)
BASOS PCT: 0.5 % (ref 0.0–3.0)
EOS PCT: 2 % (ref 0.0–5.0)
Eosinophils Absolute: 0.2 10*3/uL (ref 0.0–0.7)
HEMATOCRIT: 28.9 % — AB (ref 39.0–52.0)
HEMOGLOBIN: 9.6 g/dL — AB (ref 13.0–17.0)
LYMPHS ABS: 1.9 10*3/uL (ref 0.7–4.0)
Lymphocytes Relative: 22.1 % (ref 12.0–46.0)
MCHC: 33.1 g/dL (ref 30.0–36.0)
MCV: 86.3 fl (ref 78.0–100.0)
MONO ABS: 0.9 10*3/uL (ref 0.1–1.0)
Monocytes Relative: 10.7 % (ref 3.0–12.0)
Neutro Abs: 5.7 10*3/uL (ref 1.4–7.7)
Neutrophils Relative %: 64.7 % (ref 43.0–77.0)
PLATELETS: 379 10*3/uL (ref 150.0–400.0)
RBC: 3.35 Mil/uL — AB (ref 4.22–5.81)
RDW: 14.4 % (ref 11.5–15.5)
WBC: 8.8 10*3/uL (ref 4.0–10.5)

## 2016-11-07 LAB — TSH: TSH: 0.57 u[IU]/mL (ref 0.35–4.50)

## 2016-11-17 ENCOUNTER — Telehealth: Payer: Self-pay | Admitting: Internal Medicine

## 2016-11-17 NOTE — Telephone Encounter (Signed)
Pt request refill  HYDROcodone-acetaminophen (NORCO) 7.5-325 MG tablet   ALSO  Would like results of last labs

## 2016-11-20 NOTE — Telephone Encounter (Signed)
No refill until May 18; okay to change.  Follow-up appointment to May 18

## 2016-11-20 NOTE — Telephone Encounter (Signed)
Dr. Kirtland BouchardK - Please advise on refill. Pt reports he is completely out of medication and would like this today. Thanks!

## 2016-11-21 NOTE — Telephone Encounter (Signed)
Spoke with pt's wife and changed appt to 11/24/16. She is aware they can pick up rx at that time also. Nothing further needed at this time.

## 2016-11-24 ENCOUNTER — Encounter: Payer: Self-pay | Admitting: Internal Medicine

## 2016-11-24 ENCOUNTER — Ambulatory Visit (INDEPENDENT_AMBULATORY_CARE_PROVIDER_SITE_OTHER): Payer: Medicare Other | Admitting: Internal Medicine

## 2016-11-24 VITALS — BP 152/62 | HR 92 | Temp 98.8°F | Ht 75.0 in | Wt 157.2 lb

## 2016-11-24 DIAGNOSIS — I10 Essential (primary) hypertension: Secondary | ICD-10-CM

## 2016-11-24 DIAGNOSIS — M545 Low back pain, unspecified: Secondary | ICD-10-CM

## 2016-11-24 DIAGNOSIS — G8929 Other chronic pain: Secondary | ICD-10-CM

## 2016-11-24 DIAGNOSIS — E119 Type 2 diabetes mellitus without complications: Secondary | ICD-10-CM

## 2016-11-24 DIAGNOSIS — I2510 Atherosclerotic heart disease of native coronary artery without angina pectoris: Secondary | ICD-10-CM | POA: Diagnosis not present

## 2016-11-24 DIAGNOSIS — E538 Deficiency of other specified B group vitamins: Secondary | ICD-10-CM | POA: Diagnosis not present

## 2016-11-24 DIAGNOSIS — M48062 Spinal stenosis, lumbar region with neurogenic claudication: Secondary | ICD-10-CM

## 2016-11-24 DIAGNOSIS — D649 Anemia, unspecified: Secondary | ICD-10-CM | POA: Diagnosis not present

## 2016-11-24 LAB — CBC WITH DIFFERENTIAL/PLATELET
Basophils Absolute: 0.1 10*3/uL (ref 0.0–0.1)
Basophils Relative: 0.6 % (ref 0.0–3.0)
EOS PCT: 1 % (ref 0.0–5.0)
Eosinophils Absolute: 0.1 10*3/uL (ref 0.0–0.7)
HCT: 31.3 % — ABNORMAL LOW (ref 39.0–52.0)
Hemoglobin: 10.1 g/dL — ABNORMAL LOW (ref 13.0–17.0)
LYMPHS ABS: 2.3 10*3/uL (ref 0.7–4.0)
Lymphocytes Relative: 21.7 % (ref 12.0–46.0)
MCHC: 32.5 g/dL (ref 30.0–36.0)
MCV: 82.6 fl (ref 78.0–100.0)
MONO ABS: 1.1 10*3/uL — AB (ref 0.1–1.0)
Monocytes Relative: 9.9 % (ref 3.0–12.0)
NEUTROS ABS: 7.2 10*3/uL (ref 1.4–7.7)
NEUTROS PCT: 66.8 % (ref 43.0–77.0)
PLATELETS: 503 10*3/uL — AB (ref 150.0–400.0)
RBC: 3.78 Mil/uL — AB (ref 4.22–5.81)
RDW: 16.2 % — AB (ref 11.5–15.5)
WBC: 10.7 10*3/uL — AB (ref 4.0–10.5)

## 2016-11-24 LAB — VITAMIN B12: Vitamin B-12: 569 pg/mL (ref 211–911)

## 2016-11-24 LAB — HEMOGLOBIN A1C: Hgb A1c MFr Bld: 6.3 % (ref 4.6–6.5)

## 2016-11-24 LAB — FERRITIN: FERRITIN: 7.4 ng/mL — AB (ref 22.0–322.0)

## 2016-11-24 MED ORDER — HYDROCODONE-ACETAMINOPHEN 7.5-325 MG PO TABS: 1.0000 | ORAL_TABLET | Freq: Four times a day (QID) | ORAL | 0 refills | Status: DC | PRN

## 2016-11-24 NOTE — Patient Instructions (Signed)
Limit your sodium (Salt) intake  Please check your blood pressure on a regular basis.  If it is consistently greater than 150/90, please make an office appointment.   Please check your hemoglobin A1c every 3-6  months   

## 2016-11-24 NOTE — Progress Notes (Signed)
Subjective:    Patient ID: Christopher Dalton, male    DOB: [birth year], 76 y.o.   MRN: [id number]  HPI  76 year old patient who has a history of COPD and chronic low back pain. He was seen last month with some nonspecific complaints of dizziness and cold intolerance.  Laboratory screen revealed a normochromic normocytic anemia.  He did not return for Hemoccult slides or a ferritin or B12 level.  He has discontinued naproxen.  He stopped supplemental iron after 1 or 2 dosages due to dark stool. He has declined colonoscopies, but is agreeable for a Cologuard test.  Wt Readings from Last 3 Encounters:  11/24/16 157 lb 3.2 oz (71.3 kg)  10/27/16 158 lb (71.7 kg)  07/14/16 153 lb 9.6 oz (69.7 kg)     Past Medical History:  Diagnosis Date  . C A D 11/15/2009  . COPD 01/21/2007  . DEPRESSION 11/08/2009  . GERD (gastroesophageal reflux disease)   . HIP PAIN, LEFT 10/29/2008  . HYPERTENSION 01/21/2007  . HYPOTENSION 04/13/2009  . IMPAIRED GLUCOSE TOLERANCE 03/02/2008  . INGUINAL HERNIA, RIGHT 03/25/2007  . LOW BACK PAIN 01/21/2007  . OSTEOARTHRITIS 01/21/2007     Social History   Social History  . Marital status: Married    Spouse name: N/A  . Number of children: N/A  . Years of education: N/A   Occupational History  . Not on file.   Social History Main Topics  . Smoking status: Former Games developermoker  . Smokeless tobacco: Former NeurosurgeonUser  . Alcohol use Yes     Comment: 1-2 beers  week  . Drug use: No  . Sexual activity: Not on file   Other Topics Concern  . Not on file   Social History Narrative  . No narrative on file    Past Surgical History:  Procedure Laterality Date  . BOWEL RESECTION  03/28/2016   Procedure: SMALL BOWEL RESECTION;  Surgeon: Axel FillerArmando Ramirez, MD;  Location: [facility] OR;  Service: General;;  . FRACTURE SURGERY  1958   face  . INGUINAL HERNIA REPAIR  2008   right  . INGUINAL HERNIA REPAIR  03/28/2016   INCARCERATED  . INGUINAL HERNIA REPAIR Right 03/28/2016   Procedure:  LAPAROSCOPIC  INGUINAL HERNIA;  Surgeon: Axel FillerArmando Ramirez, MD;  Location: [facility] OR;  Service: General;  Laterality: Right;  RIGHT INGUINAL HERNIA REPAIR   . LUMBAR LAMINECTOMY/DECOMPRESSION MICRODISCECTOMY Left 01/28/2014   Procedure: Complete DECOMPRESSION lumbar laminectomy for spinal stenosis,  MICRODISCECTOMY  L3-4 ON LEFT for foraminotomy for L3-L4 nerve root;  Surgeon: Jacki Conesonald A Gioffre, MD;  Location: [facility];  Service: Orthopedics;  Laterality: Left;  . RECONSTRUCTION MID-FACE    . TONSILLECTOMY  unknown    Family History  Problem Relation Age of Onset  . Cancer Mother        lung ca  . Cancer Father        unclear type    Allergies  Allergen Reactions  . Barbiturates Swelling and Other (See Comments)    tongue and hands swell  . Amoxicillin Other (See Comments)    Unknown  . Doxycycline Other (See Comments)    unknown  . Penicillins Itching and Swelling  . Pravastatin Other (See Comments)    Unknown  . Statins Other (See Comments)    mucsle pain  . Sulfamethoxazole Other (See Comments)    REACTION: unspecified    Current Outpatient Prescriptions on File Prior to Visit  Medication Sig Dispense Refill  . benazepril-hydrochlorthiazide (LOTENSIN HCT)  20-12.5 MG tablet TAKE 1 TABLET BY MOUTH DAILY. 90 tablet 1  . CRANBERRY PO Take 1 tablet by mouth daily.    . diazepam (VALIUM) 5 MG tablet Take 1 tablet (5 mg total) by mouth at bedtime as needed. 90 tablet 0  . docusate sodium (COLACE) 100 MG capsule Take 1 capsule (100 mg total) by mouth 2 (two) times daily as needed for mild constipation. 20 capsule 1  . HYDROcodone-acetaminophen (NORCO) 7.5-325 MG tablet Take 1 tablet by mouth every 6 (six) hours as needed. 120 tablet 0  . loratadine (CLARITIN) 10 MG tablet Take 10 mg by mouth daily.      . metFORMIN (GLUCOPHAGE) 500 MG tablet TAKE 1 TABLET (500 MG TOTAL) BY MOUTH 2 (TWO) TIMES DAILY WITH A MEAL. 180 tablet 2  . Multiple Vitamin (MULTIVITAMIN WITH MINERALS) TABS tablet Take 1  tablet by mouth daily.    . naproxen (NAPROSYN) 375 MG tablet Take 1 tablet (375 mg total) by mouth 2 (two) times daily. 20 tablet 0   No current facility-administered medications on file prior to visit.     BP (!) 152/62 (BP Location: Left Arm, Patient Position: Sitting, Cuff Size: Normal)   Pulse 92   Temp 98.8 F (37.1 C) (Oral)   Ht 6\' 3"  (1.905 m)   Wt 157 lb 3.2 oz (71.3 kg)   SpO2 98%   BMI 19.65 kg/m     Review of Systems  Constitutional: Negative for appetite change, chills, fatigue and fever.  HENT: Negative for congestion, dental problem, ear pain, hearing loss, sore throat, tinnitus, trouble swallowing and voice change.   Eyes: Negative for pain, discharge and visual disturbance.  Respiratory: Negative for cough, chest tightness, wheezing and stridor.   Cardiovascular: Negative for chest pain, palpitations and leg swelling.  Gastrointestinal: Negative for abdominal distention, abdominal pain, blood in stool, constipation, diarrhea, nausea and vomiting.  Genitourinary: Negative for difficulty urinating, discharge, flank pain, genital sores, hematuria and urgency.  Musculoskeletal: Positive for arthralgias and back pain. Negative for gait problem, joint swelling, myalgias and neck stiffness.  Skin: Negative for rash.  Neurological: Negative for dizziness, syncope, speech difficulty, weakness, numbness and headaches.  Hematological: Negative for adenopathy. Does not bruise/bleed easily.  Psychiatric/Behavioral: Negative for behavioral problems and dysphoric mood. The patient is not nervous/anxious.        Objective:   Physical Exam  Constitutional: He is oriented to person, place, and time. He appears well-developed.  Thin No distress Blood pressure 120/80  HENT:  Head: Normocephalic.  Right Ear: External ear normal.  Left Ear: External ear normal.  Eyes: Conjunctivae and EOM are normal.  Neck: Normal range of motion.  Cardiovascular: Normal rate and normal  heart sounds.   Pulmonary/Chest: Breath sounds normal.  Abdominal: Bowel sounds are normal.  Musculoskeletal: Normal range of motion. He exhibits no edema or tenderness.  Neurological: He is alert and oriented to person, place, and time.  Psychiatric: He has a normal mood and affect. His behavior is normal.          Assessment & Plan:   Normochromic normocytic anemia.  Possibly secondary to naproxen.  Will check follow-up CBC, B12 and ferritin level Check Cologuard  Chronic low back pain.  Vicodin refilled.  Patient requested also a refill on diazepam.  He has not been taking this for some time and this medication will be discontinued  Vicodin refilled  Essential hypertension, stable  Rogelia Boga

## 2016-11-27 ENCOUNTER — Other Ambulatory Visit: Payer: Self-pay | Admitting: Internal Medicine

## 2016-11-27 ENCOUNTER — Ambulatory Visit: Payer: Medicare Other | Admitting: Internal Medicine

## 2016-12-11 ENCOUNTER — Ambulatory Visit (INDEPENDENT_AMBULATORY_CARE_PROVIDER_SITE_OTHER): Payer: Medicare Other | Admitting: Family Medicine

## 2016-12-11 ENCOUNTER — Encounter: Payer: Self-pay | Admitting: Family Medicine

## 2016-12-11 VITALS — BP 104/66 | HR 76 | Temp 98.8°F | Ht 75.0 in | Wt 159.6 lb

## 2016-12-11 DIAGNOSIS — I2510 Atherosclerotic heart disease of native coronary artery without angina pectoris: Secondary | ICD-10-CM | POA: Diagnosis not present

## 2016-12-11 DIAGNOSIS — R1084 Generalized abdominal pain: Secondary | ICD-10-CM | POA: Diagnosis not present

## 2016-12-11 MED ORDER — CIPROFLOXACIN HCL 500 MG PO TABS: 500.0000 mg | ORAL_TABLET | Freq: Two times a day (BID) | ORAL | 0 refills | Status: DC

## 2016-12-11 MED ORDER — METRONIDAZOLE 500 MG PO TABS: 500.0000 mg | ORAL_TABLET | Freq: Three times a day (TID) | ORAL | 0 refills | Status: DC

## 2016-12-11 NOTE — Progress Notes (Signed)
Subjective:  Christopher Dalton is a 76 y.o. year old very pleasant male patient who presents for/with See problem oriented charting ROS- no fever. At one point states has some chills then firmly declines later in visit. Does have abdominal pain. Has had one episode of nealry vomiting with more regular mild nausea    Past Medical History-  Patient Active Problem List   Diagnosis Date Noted  . S/P hernia repair 03/29/2016  . Hernia, inguinal, right 03/28/2016  . Statin intolerance 12/08/2015  . Diabetes mellitus without complication (HCC) 02/23/2014  . 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 Coronary atherosclerosis 11/15/2009  . DEPRESSION 11/08/2009  . ELECTROCARDIOGRAM, ABNORMAL 11/08/2009  . NOSEBLEED 05/17/2009  . Hypotension 04/13/2009  . SHINGLES 04/02/2009  . HIP PAIN, LEFT 10/29/2008  . INGUINAL HERNIA, RIGHT 03/25/2007  . Essential hypertension 01/21/2007  . Allergic rhinitis 01/21/2007  . COPD exacerbation (HCC) 01/21/2007  . Osteoarthritis 01/21/2007  . LOW BACK PAIN 01/21/2007    Medications- reviewed and updated Current Outpatient Prescriptions  Medication Sig Dispense Refill  . benazepril-hydrochlorthiazide (LOTENSIN HCT) 20-12.5 MG tablet TAKE 1 TABLET BY MOUTH DAILY. 90 tablet 1  . CRANBERRY PO Take 1 tablet by mouth daily.    Marland Kitchen. docusate sodium (COLACE) 100 MG capsule Take 1 capsule (100 mg total) by mouth 2 (two) times daily as needed for mild constipation. 20 capsule 1  . HYDROcodone-acetaminophen (NORCO) 7.5-325 MG tablet Take 1 tablet by mouth every 6 (six) hours as needed. 120 tablet 0  . loratadine (CLARITIN) 10 MG tablet Take 10 mg by mouth daily.      . metFORMIN (GLUCOPHAGE) 500 MG tablet TAKE 1 TABLET (500 MG TOTAL) BY MOUTH 2 (TWO) TIMES DAILY WITH A MEAL. 180 tablet 2  . Multiple Vitamin (MULTIVITAMIN WITH MINERALS) TABS tablet Take 1 tablet by mouth daily.    . naproxen (NAPROSYN) 375 MG tablet Take 1 tablet (375 mg  total) by mouth 2 (two) times daily. 20 tablet 0   No current facility-administered medications for this visit.     Objective: BP 104/66 (BP Location: Left Arm, Patient Position: Sitting, Cuff Size: Large)   Pulse 76   Temp 98.8 F (37.1 C) (Oral)   Ht 6\' 3"  (1.905 m)   Wt 159 lb 9.6 oz (72.4 kg)   SpO2 96%   BMI 19.95 kg/m  Gen: NAD, resting comfortably Mildly dry mucus membranes CV: RRR no murmurs rubs or gallops Lungs: CTAB no crackles, wheeze, rhonchi Abdomen: soft/diffusely tender- he is more tender in LLQ and he asks me to stop with exam firmly/nondistended/normal bowel sounds. No rebound or guarding.  Ext: no edema Skin: warm, dry Uses wife walks in front of him with his hand on her right shoulder to give him support.   Assessment/Plan:  Diffuse abdominal pain - Plan: Lipase, CBC with Differential/Platelet, Comprehensive metabolic panel S: diffuse abdominal pain for 2 days. Last food was on Friday night. Has been drinking water, 2 cokes- seemed to help slightly with pain. Loud abdominal sounds and rumbling. Last bowel movement was on Friday. Tends to get constipated but diarrhea was his last bowel movement. Still passing gas. Stools were not particularly hard before this happened. Felt like vomiting once. Has some nausea.   Constipation risk factor- regular hydrocodone use. Has not taken anything for this other than a coke. Has used milk of magnesia in the past. Also used laxative in pill form in the past A/P: Tough situation here in 76  year old male with diffuse abdominal pain and nausea as only reported symptoms but largely unwilling to embark on workup or treatments. I wondered about small bowel obstruction but luckily still passing gas. Exam points to LLQ pain source- makes me wonder about diverticulitis so that is ultimately what we were able to get patient to agree to treatment for. I was concerned about him and need for potential fluids and ER workup which he declined-  also declined any imaging outside of our office and we do not even have x-ray. . See AVS notes.   Patient Instructions  I am concerned about how poorly you are feeling.   Hold your metformin while you are this sick.   I think most ideal would be to go to the hospital to get some IV fluids and workup there to see what is causing your pain. You declined.   I also offered you x-ray which you declined for now. If you stop passing gas or symptoms worsen- you need to go to the hospital immediatley  GIven pain in your left lower quadrant I worry about possible diverticulitis. You declined CT scan. I am going to treat you with 2 antibiotics. I would encourage you to see [place] back in 24-48 hours for repeat examination.   Please stop by lab before you go   Advised 1-2 day follow up.   Orders Placed This Encounter  Procedures  . Lipase  . CBC with Differential/Platelet  . Comprehensive metabolic panel    [place]    Meds ordered this encounter  Medications  . metroNIDAZOLE (FLAGYL) 500 MG tablet    Sig: Take 1 tablet (500 mg total) by mouth 3 (three) times daily.    Dispense:  30 tablet    Refill:  0  . ciprofloxacin (CIPRO) 500 MG tablet    Sig: Take 1 tablet (500 mg total) by mouth 2 (two) times daily.    Dispense:  20 tablet    Refill:  0    Return precautions advised.  Tana Conch, MD

## 2016-12-11 NOTE — Patient Instructions (Addendum)
I am concerned about how poorly you are feeling.   Hold your metformin while you are this sick.   I think most ideal would be to go to the hospital to get some IV fluids and workup there to see what is causing your pain. You declined.   I also offered you x-ray which you declined for now. If you stop passing gas or symptoms worsen- you need to go to the hospital immediatley  GIven pain in your left lower quadrant I worry about possible diverticulitis. You declined CT scan. I am going to treat you with 2 antibiotics. I would encourage you to see us back in 24-48 hours for repeat examination.   Please stop by lab before you go

## 2016-12-12 LAB — CBC WITH DIFFERENTIAL/PLATELET
BASOS PCT: 1.2 % (ref 0.0–3.0)
Basophils Absolute: 0.1 10*3/uL (ref 0.0–0.1)
EOS ABS: 0 10*3/uL (ref 0.0–0.7)
Eosinophils Relative: 0.4 % (ref 0.0–5.0)
HEMATOCRIT: 32.6 % — AB (ref 39.0–52.0)
Hemoglobin: 10.7 g/dL — ABNORMAL LOW (ref 13.0–17.0)
LYMPHS ABS: 1.8 10*3/uL (ref 0.7–4.0)
Lymphocytes Relative: 17.9 % (ref 12.0–46.0)
MCHC: 32.8 g/dL (ref 30.0–36.0)
MCV: 79.6 fl (ref 78.0–100.0)
MONO ABS: 1.1 10*3/uL — AB (ref 0.1–1.0)
Monocytes Relative: 10.4 % (ref 3.0–12.0)
NEUTROS ABS: 7.2 10*3/uL (ref 1.4–7.7)
NEUTROS PCT: 70.1 % (ref 43.0–77.0)
PLATELETS: 346 10*3/uL (ref 150.0–400.0)
RBC: 4.1 Mil/uL — ABNORMAL LOW (ref 4.22–5.81)
RDW: 17.3 % — AB (ref 11.5–15.5)
WBC: 10.3 10*3/uL (ref 4.0–10.5)

## 2016-12-12 LAB — COMPREHENSIVE METABOLIC PANEL
ALT: 21 U/L (ref 0–53)
AST: 25 U/L (ref 0–37)
Albumin: 4.5 g/dL (ref 3.5–5.2)
Alkaline Phosphatase: 54 U/L (ref 39–117)
BUN: 14 mg/dL (ref 6–23)
CALCIUM: 9.6 mg/dL (ref 8.4–10.5)
CHLORIDE: 99 meq/L (ref 96–112)
CO2: 28 meq/L (ref 19–32)
CREATININE: 0.88 mg/dL (ref 0.40–1.50)
GFR: 88.98 mL/min (ref >60.00)
GLUCOSE: 113 mg/dL — AB (ref 70–99)
Potassium: 4 mEq/L (ref 3.5–5.1)
Sodium: 136 mEq/L (ref 135–145)
Total Bilirubin: 0.5 mg/dL (ref 0.2–1.2)
Total Protein: 6.9 g/dL (ref 6.0–8.3)

## 2016-12-12 LAB — LIPASE: Lipase: 16 U/L (ref 11.0–59.0)

## 2016-12-13 ENCOUNTER — Ambulatory Visit (INDEPENDENT_AMBULATORY_CARE_PROVIDER_SITE_OTHER): Payer: Medicare Other | Admitting: Family Medicine

## 2016-12-13 ENCOUNTER — Encounter: Payer: Self-pay | Admitting: Family Medicine

## 2016-12-13 VITALS — BP 130/70 | HR 92 | Temp 97.8°F | Ht 75.0 in | Wt 153.8 lb

## 2016-12-13 DIAGNOSIS — R1084 Generalized abdominal pain: Secondary | ICD-10-CM

## 2016-12-13 DIAGNOSIS — I2510 Atherosclerotic heart disease of native coronary artery without angina pectoris: Secondary | ICD-10-CM

## 2016-12-13 DIAGNOSIS — K5792 Diverticulitis of intestine, part unspecified, without perforation or abscess without bleeding: Secondary | ICD-10-CM

## 2016-12-13 MED ORDER — ONDANSETRON 4 MG PO TBDP: 4.0000 mg | ORAL_TABLET | Freq: Three times a day (TID) | ORAL | 0 refills | Status: AC | PRN

## 2016-12-13 NOTE — Progress Notes (Signed)
Subjective:  Christopher Dalton is a 76 y.o. year old very pleasant male patient who presents for/with See problem oriented charting ROS- no fever or chills. Some nausea. No vomiting. Improved diffuse abdominal pain.    Past Medical History-  Patient Active Problem List   Diagnosis Date Noted  . S/P hernia repair 03/29/2016  . Hernia, inguinal, right 03/28/2016  . Statin intolerance 12/08/2015  . Diabetes mellitus without complication (HCC) 02/23/2014  . 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 Coronary atherosclerosis 11/15/2009  . DEPRESSION 11/08/2009  . ELECTROCARDIOGRAM, ABNORMAL 11/08/2009  . NOSEBLEED 05/17/2009  . Hypotension 04/13/2009  . SHINGLES 04/02/2009  . HIP PAIN, LEFT 10/29/2008  . INGUINAL HERNIA, RIGHT 03/25/2007  . Essential hypertension 01/21/2007  . Allergic rhinitis 01/21/2007  . COPD exacerbation (HCC) 01/21/2007  . Osteoarthritis 01/21/2007  . LOW BACK PAIN 01/21/2007    Medications- reviewed and updated Current Outpatient Prescriptions  Medication Sig Dispense Refill  . benazepril-hydrochlorthiazide (LOTENSIN HCT) 20-12.5 MG tablet TAKE 1 TABLET BY MOUTH DAILY. 90 tablet 1  . ciprofloxacin (CIPRO) 500 MG tablet Take 1 tablet (500 mg total) by mouth 2 (two) times daily. 20 tablet 0  . CRANBERRY PO Take 1 tablet by mouth daily.    Marland Kitchen docusate sodium (COLACE) 100 MG capsule Take 1 capsule (100 mg total) by mouth 2 (two) times daily as needed for mild constipation. 20 capsule 1  . HYDROcodone-acetaminophen (NORCO) 7.5-325 MG tablet Take 1 tablet by mouth every 6 (six) hours as needed. 120 tablet 0  . loratadine (CLARITIN) 10 MG tablet Take 10 mg by mouth daily.      . metFORMIN (GLUCOPHAGE) 500 MG tablet TAKE 1 TABLET (500 MG TOTAL) BY MOUTH 2 (TWO) TIMES DAILY WITH A MEAL. 180 tablet 2  . metroNIDAZOLE (FLAGYL) 500 MG tablet Take 1 tablet (500 mg total) by mouth 3 (three) times daily. 30 tablet 0  . Multiple Vitamin  (MULTIVITAMIN WITH MINERALS) TABS tablet Take 1 tablet by mouth daily.    . naproxen (NAPROSYN) 375 MG tablet Take 1 tablet (375 mg total) by mouth 2 (two) times daily. 20 tablet 0  . ondansetron (ZOFRAN-ODT) 4 MG disintegrating tablet Take 1 tablet (4 mg total) by mouth every 8 (eight) hours as needed for nausea or vomiting. 20 tablet 0   No current facility-administered medications for this visit.     Objective: BP 130/70 (BP Location: Left Arm, Patient Position: Sitting, Cuff Size: Normal)   Pulse 92   Temp 97.8 F (36.6 C) (Oral)   Ht 6\' 3"  (1.905 m)   Wt 153 lb 12.8 oz (69.8 kg)   SpO2 98%   BMI 19.22 kg/m  Gen: NAD, resting comfortably CV: RRR no murmurs rubs or gallops Lungs: CTAB no crackles, wheeze, rhonchi Abdomen: soft/very mild tenderness throughout- much improved in the LLQ/nondistended/normal bowel sounds. No rebound or guarding.  Ext: no edema Skin: warm, dry  Assessment/Plan:  Diffuse abdominal pain  Diverticulitis S: Patient seen 2 days ago for diffuse abdominal pain and nausea but with exam localizing to LLQ. He had not had a bowel movement in 3 days either. He appeared somewhat dehydrated and I advised further workup to include x-ray, CT, or ER visit- he declined all of the above.   Given LLQ pain we opted to treat him for diverticulitis- He has had significant improvement on this regimen. His pain is now mild from moderate to severe. He is no longer having to walk with his hand  on wife's shoulder for support. He ate a meal that included chicken and rice yesterday and had not eaten anything in 3 days prior to last visit. Continues to have some nausea. Compliant with cipro and flagyl A/P: Presumed diverticulitis improving. Add zofran for nausea.   Patient Instructions  Glad you are doing better  Continue antibiotics  Continue to hold metformin  See Dr. Kirtland BouchardK back on Tuesday of next week at 8 AM- schedule this at check out before you leave  Sent in nausea  medicine for you if you want to try this to see if you can be less nauseous  See us back if worsening pain, cant keep food down or fever above 100.5.   Meds ordered this encounter  Medications  . ondansetron (ZOFRAN-ODT) 4 MG disintegrating tablet    Sig: Take 1 tablet (4 mg total) by mouth every 8 (eight) hours as needed for nausea or vomiting.    Dispense:  20 tablet    Refill:  0    Return precautions advised.  Tana ConchStephen Sadao Weyer, MD

## 2016-12-13 NOTE — Patient Instructions (Addendum)
Glad you are doing better  Continue antibiotics  Continue to hold metformin  See Dr. Kirtland BouchardK back on Tuesday of next week at 8 AM- schedule this at check out before you leave  Sent in nausea medicine for you if you want to try this to see if you can be less nauseous  See us back if worsening pain, cant keep food down or fever above 100.5.

## 2016-12-19 ENCOUNTER — Ambulatory Visit: Payer: Medicare Other | Admitting: Internal Medicine

## 2016-12-19 DIAGNOSIS — Z0289 Encounter for other administrative examinations: Secondary | ICD-10-CM

## 2017-01-08 ENCOUNTER — Encounter: Payer: Self-pay | Admitting: Family Medicine

## 2017-01-08 ENCOUNTER — Ambulatory Visit (INDEPENDENT_AMBULATORY_CARE_PROVIDER_SITE_OTHER): Payer: Medicare Other | Admitting: Family Medicine

## 2017-01-08 VITALS — BP 164/84 | HR 81 | Temp 98.4°F | Ht 75.0 in | Wt 154.6 lb

## 2017-01-08 DIAGNOSIS — I2510 Atherosclerotic heart disease of native coronary artery without angina pectoris: Secondary | ICD-10-CM

## 2017-01-08 DIAGNOSIS — I10 Essential (primary) hypertension: Secondary | ICD-10-CM | POA: Diagnosis not present

## 2017-01-08 DIAGNOSIS — R1084 Generalized abdominal pain: Secondary | ICD-10-CM

## 2017-01-08 MED ORDER — OMEPRAZOLE 40 MG PO CPDR: 40.0000 mg | DELAYED_RELEASE_CAPSULE | Freq: Every day | ORAL | 1 refills | Status: AC

## 2017-01-08 NOTE — Progress Notes (Signed)
Subjective:  Christopher Dalton is a 76 y.o. year old very pleasant male patient who presents for/with See problem oriented charting ROS- denies fever or chills. Has nausea and vomiting episodes.  Has abdominal pain- for most part keeping food down  Past Medical History-  Patient Active Problem List   Diagnosis Date Noted  . S/P hernia repair 03/29/2016  . Hernia, inguinal, right 03/28/2016  . Statin intolerance 12/08/2015  . Diabetes mellitus without complication (HCC) 02/23/2014  . 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 Coronary atherosclerosis 11/15/2009  . DEPRESSION 11/08/2009  . ELECTROCARDIOGRAM, ABNORMAL 11/08/2009  . NOSEBLEED 05/17/2009  . Hypotension 04/13/2009  . SHINGLES 04/02/2009  . HIP PAIN, LEFT 10/29/2008  . INGUINAL HERNIA, RIGHT 03/25/2007  . Essential hypertension 01/21/2007  . Allergic rhinitis 01/21/2007  . COPD exacerbation (HCC) 01/21/2007  . Osteoarthritis 01/21/2007  . LOW BACK PAIN 01/21/2007    Medications- reviewed and updated Current Outpatient Prescriptions  Medication Sig Dispense Refill  . benazepril-hydrochlorthiazide (LOTENSIN HCT) 20-12.5 MG tablet TAKE 1 TABLET BY MOUTH DAILY. 90 tablet 1  . CRANBERRY PO Take 1 tablet by mouth daily.    Marland Kitchen. HYDROcodone-acetaminophen (NORCO) 7.5-325 MG tablet Take 1 tablet by mouth every 6 (six) hours as needed. 120 tablet 0  . loratadine (CLARITIN) 10 MG tablet Take 10 mg by mouth daily.      . Multiple Vitamin (MULTIVITAMIN WITH MINERALS) TABS tablet Take 1 tablet by mouth daily.    . naproxen (NAPROSYN) 375 MG tablet Take 1 tablet (375 mg total) by mouth 2 (two) times daily. 20 tablet 0  . ondansetron (ZOFRAN-ODT) 4 MG disintegrating tablet Take 1 tablet (4 mg total) by mouth every 8 (eight) hours as needed for nausea or vomiting. 20 tablet 0  . docusate sodium (COLACE) 100 MG capsule Take 1 capsule (100 mg total) by mouth 2 (two) times daily as needed for mild constipation.  (Patient not taking: Reported on 01/08/2017) 20 capsule 1  . omeprazole (PRILOSEC) 40 MG capsule Take 1 capsule (40 mg total) by mouth daily. 30 capsule 1   No current facility-administered medications for this visit.     Objective: BP (!) 164/84   Pulse 81   Temp 98.4 F (36.9 C) (Oral)   Ht 6\' 3"  (1.905 m)   Wt 154 lb 9.6 oz (70.1 kg)   SpO2 97%   BMI 19.32 kg/m  Gen: appears fatigued, paces room due to pain at times CV: RRR no murmurs rubs or gallops Lungs: CTAB no crackles, wheeze, rhonchi Abdomen: soft/diffuse abdominal pain worse in RLQ and LLQ/nondistended/normal bowel sounds. No rebound or guarding.  Ext: no edema Skin: warm, dry  Assessment/Plan:  Diffuse abdominal pain - Plan: CBC with Differential/Platelet, Comprehensive metabolic panel, Lipase, CT Abdomen Pelvis W Contrast S: Patient was treated in office a month ago for presumed diverticulitis when found to have LLQ pain- today pain is mainly in RUQ and LUQ. He declined hospital visitor imaging at that time He was treated with cipro and flagyl. At 2 day follow up, was improved and advised follow up with PCP Dr. Amador CunasKwiatkowski the next week- he no showed for that visit stating he didn't need it. Today, has a hard time with time course but states that for at least 3-4 weeks he has had burning diffuse abdominal pain. Also has nausea and vomiting but no blood. States has severe pain and seems to be worse in RUQ and LUQ. Not sleeping well. Lost 5 lbs overall  but weight actually up from last visit. He takes hydrocodone for back pain. Not taking nsaids. Does admit to ulcers in the past A/P: continued diffuse abdominal pain though focused in RUQ and LUQ. Labs as below. Stat CT scan tomorrow. Trial PPI though do not suspect ulcer strongly. Already avoids NSAIDs  Monitor blood pressure- elevated due to acute pain today  Orders Placed This Encounter  Procedures  . CT Abdomen Pelvis W Contrast    [facility]. [facility] [phone] [id number]/ MCR/  Diabetic / will get labs     Standing Status:   Future    Standing Expiration Date:   04/10/2018    Order Specific Question:   If indicated for the ordered procedure, I authorize the administration of contrast media per Radiology protocol    Answer:   Yes    Order Specific Question:   Reason for Exam (SYMPTOM  OR DIAGNOSIS REQUIRED)    Answer:   diffuse abdominal pain. worse in RUQ and LUQ. labs pending    Order Specific Question:   Preferred imaging location?    Answer:   [place] [facility] - [facility]    Order Specific Question:   Radiology Contrast Protocol - do NOT remove file path    Answer:   [email or web address]  . CBC with Differential/Platelet  . Comprehensive metabolic panel    [place]  . Lipase    Meds ordered this encounter  Medications  . omeprazole (PRILOSEC) 40 MG capsule    Sig: Take 1 capsule (40 mg total) by mouth daily.    Dispense:  30 capsule    Refill:  1   The duration of face-to-face time during this visit was greater than 25 minutes. Greater than 50% of this time was spent in counseling, explanation of diagnosis, planning of further management, and/or coordination of care including discussing reasoning for lab tests, answering patient questions about how long process will take and where imaging will be done, explaining why CT scan, explaining importance of following up and through in light of prior no show which he denies, discussing ED return precautions.   Return precautions advised.  Tana Conch, MD

## 2017-01-08 NOTE — Patient Instructions (Addendum)
Please stop by lab before you go  We will call you within a day about CT scan for tomorrow  Seek care immediately if worsening pain, cant keep food down or fever above 100.5.

## 2017-01-09 ENCOUNTER — Ambulatory Visit (INDEPENDENT_AMBULATORY_CARE_PROVIDER_SITE_OTHER)
Admission: RE | Admit: 2017-01-09 | Discharge: 2017-01-09 | Disposition: A | Discharge status: Home / Self Care | Payer: Medicare Other | Source: Ambulatory Visit | Attending: Family Medicine | Admitting: Family Medicine

## 2017-01-09 DIAGNOSIS — R1084 Generalized abdominal pain: Secondary | ICD-10-CM | POA: Diagnosis not present

## 2017-01-09 LAB — CBC WITH DIFFERENTIAL/PLATELET
BASOS PCT: 0.6 % (ref 0.0–3.0)
Basophils Absolute: 0.1 10*3/uL (ref 0.0–0.1)
EOS ABS: 0.1 10*3/uL (ref 0.0–0.7)
EOS PCT: 0.8 % (ref 0.0–5.0)
HEMATOCRIT: 35.2 % — AB (ref 39.0–52.0)
HEMOGLOBIN: 11.4 g/dL — AB (ref 13.0–17.0)
LYMPHS PCT: 22.7 % (ref 12.0–46.0)
Lymphs Abs: 2.3 10*3/uL (ref 0.7–4.0)
MCHC: 32.3 g/dL (ref 30.0–36.0)
MCV: 76.9 fl — ABNORMAL LOW (ref 78.0–100.0)
Monocytes Absolute: 0.9 10*3/uL (ref 0.1–1.0)
Monocytes Relative: 9 % (ref 3.0–12.0)
NEUTROS ABS: 6.7 10*3/uL (ref 1.4–7.7)
Neutrophils Relative %: 66.9 % (ref 43.0–77.0)
PLATELETS: 362 10*3/uL (ref 150.0–400.0)
RBC: 4.57 Mil/uL (ref 4.22–5.81)
RDW: 18.5 % — AB (ref 11.5–15.5)
WBC: 10 10*3/uL (ref 4.0–10.5)

## 2017-01-09 LAB — COMPREHENSIVE METABOLIC PANEL
ALBUMIN: 4.5 g/dL (ref 3.5–5.2)
ALT: 15 U/L (ref 0–53)
AST: 19 U/L (ref 0–37)
Alkaline Phosphatase: 56 U/L (ref 39–117)
BUN: 11 mg/dL (ref 6–23)
CALCIUM: 9.9 mg/dL (ref 8.4–10.5)
CHLORIDE: 103 meq/L (ref 96–112)
CO2: 27 meq/L (ref 19–32)
Creatinine, Ser: 0.9 mg/dL (ref 0.40–1.50)
GFR: 86.68 mL/min (ref >60.00)
Glucose, Bld: 145 mg/dL — ABNORMAL HIGH (ref 70–99)
POTASSIUM: 3.5 meq/L (ref 3.5–5.1)
Sodium: 141 mEq/L (ref 135–145)
Total Bilirubin: 0.5 mg/dL (ref 0.2–1.2)
Total Protein: 7.1 g/dL (ref 6.0–8.3)

## 2017-01-09 LAB — LIPASE: LIPASE: 7 U/L — AB (ref 11.0–59.0)

## 2017-01-09 MED ORDER — IOPAMIDOL (ISOVUE-300) INJECTION 61%
100.0000 mL | Freq: Once | INTRAVENOUS | Status: AC | PRN
  Administered 2017-01-09: 100 mL via INTRAVENOUS

## 2017-01-11 ENCOUNTER — Telehealth: Payer: Self-pay | Admitting: Internal Medicine

## 2017-01-11 DIAGNOSIS — M48062 Spinal stenosis, lumbar region with neurogenic claudication: Secondary | ICD-10-CM

## 2017-01-11 NOTE — Telephone Encounter (Signed)
The patient needs the Rx refilled: HYDROcodone-acetaminophen (NORCO) 7.5-325 MG tablet   The patient only has 3 left and has to take them every 4 hours and needs the Rx as soon as possible

## 2017-01-12 MED ORDER — HYDROCODONE-ACETAMINOPHEN 7.5-325 MG PO TABS: 1.0000 | ORAL_TABLET | Freq: Four times a day (QID) | ORAL | 0 refills | Status: DC | PRN

## 2017-01-12 NOTE — Telephone Encounter (Signed)
Rx printed for signature. 

## 2017-01-12 NOTE — Telephone Encounter (Signed)
Patient's wife aware Rx is up front for pick up.

## 2017-01-12 NOTE — Telephone Encounter (Signed)
Okay to refill? 

## 2017-01-12 NOTE — Telephone Encounter (Signed)
When was this medication last prescribed??

## 2017-01-12 NOTE — Telephone Encounter (Signed)
5.20.18 

## 2017-01-26 ENCOUNTER — Encounter: Payer: Self-pay | Admitting: Internal Medicine

## 2017-01-26 ENCOUNTER — Ambulatory Visit (INDEPENDENT_AMBULATORY_CARE_PROVIDER_SITE_OTHER): Payer: Medicare Other | Admitting: Internal Medicine

## 2017-01-26 VITALS — BP 138/78 | HR 75 | Temp 98.3°F | Ht 75.0 in | Wt 160.0 lb

## 2017-01-26 DIAGNOSIS — M48062 Spinal stenosis, lumbar region with neurogenic claudication: Secondary | ICD-10-CM

## 2017-01-26 DIAGNOSIS — E119 Type 2 diabetes mellitus without complications: Secondary | ICD-10-CM | POA: Diagnosis not present

## 2017-01-26 DIAGNOSIS — I2510 Atherosclerotic heart disease of native coronary artery without angina pectoris: Secondary | ICD-10-CM

## 2017-01-26 DIAGNOSIS — I10 Essential (primary) hypertension: Secondary | ICD-10-CM | POA: Diagnosis not present

## 2017-01-26 NOTE — Patient Instructions (Addendum)
COLOGUARD testing- This is extremely important.  You have colon cancer until we prove otherwise  Limit your sodium (Salt) intake    It is important that you exercise regularly, at least 20 minutes 3 to 4 times per week.  If you develop chest pain or shortness of breath seek  medical attention.  Return in 3 months for follow-up  Take one iron tablet twice daily

## 2017-01-26 NOTE — Progress Notes (Signed)
Subjective:    Patient ID: Christopher Dalton, male    DOB: [birth year], 76 y.o.   MRN: [id number]  HPI  76 year old patient who is seen today in follow-up.  He has been seen recently for abdominal pain which has resolved.  He has had no recurrent pain in over one week.  Apparently he is not taking naproxen. He does have a history of iron deficiency anemia.  The importance of cologuard testing emphasized today.  Anemia has been stable. He has a history of type 2 diabetes.  Presently diet controlled.  Recent hemoglobin A1c in a nondiabetic range. He has essential hypertension No cardiopulmonary complaints.  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  Past Medical History:  Diagnosis Date  . C A D 11/15/2009  . COPD 01/21/2007  . DEPRESSION 11/08/2009  . GERD (gastroesophageal reflux disease)   . HIP PAIN, LEFT 10/29/2008  . HYPERTENSION 01/21/2007  . HYPOTENSION 04/13/2009  . IMPAIRED GLUCOSE TOLERANCE 03/02/2008  . INGUINAL HERNIA, RIGHT 03/25/2007  . LOW BACK PAIN 01/21/2007  . OSTEOARTHRITIS 01/21/2007     Social History   Social History  . Marital status: Married    Spouse name: N/A  . Number of children: N/A  . Years of education: N/A   Occupational History  . Not on file.   Social History Main Topics  . Smoking status: Former Games developer  . Smokeless tobacco: Former Neurosurgeon  . Alcohol use Yes     Comment: 1-2 beers  week  . Drug use: No  . Sexual activity: Not on file   Other Topics Concern  . Not on file   Social History Narrative  . No narrative on file    Past Surgical History:  Procedure Laterality Date  . BOWEL RESECTION  03/28/2016   Procedure: SMALL BOWEL RESECTION;  Surgeon: Axel Filler, MD;  Location: [facility] OR;  Service: General;;  . FRACTURE SURGERY  1958   face  . INGUINAL HERNIA REPAIR  2008   right  . INGUINAL HERNIA REPAIR   03/28/2016   INCARCERATED  . INGUINAL HERNIA REPAIR Right 03/28/2016   Procedure: LAPAROSCOPIC  INGUINAL HERNIA;  Surgeon: Axel Filler, MD;  Location: [facility] OR;  Service: General;  Laterality: Right;  RIGHT INGUINAL HERNIA REPAIR   . LUMBAR LAMINECTOMY/DECOMPRESSION MICRODISCECTOMY Left 01/28/2014   Procedure: Complete DECOMPRESSION lumbar laminectomy for spinal stenosis,  MICRODISCECTOMY  L3-4 ON LEFT for foraminotomy for L3-L4 nerve root;  Surgeon: Jacki Cones, MD;  Location: [facility];  Service: Orthopedics;  Laterality: Left;  . RECONSTRUCTION MID-FACE    . TONSILLECTOMY  unknown    Family History  Problem Relation Age of Onset  . Cancer Mother        lung ca  . Cancer Father        unclear type    Allergies  Allergen Reactions  . Barbiturates Swelling and Other (See Comments)    tongue and hands swell  . Amoxicillin Other (See Comments)    Unknown  . Doxycycline Other (See Comments)    unknown  . Penicillins Itching and Swelling  . Pravastatin Other (See Comments)    Unknown  . Statins Other (See Comments)    mucsle pain  . Sulfamethoxazole Other (See Comments)    REACTION: unspecified    Current Outpatient Prescriptions on File Prior to Visit  Medication Sig Dispense Refill  .  benazepril-hydrochlorthiazide (LOTENSIN HCT) 20-12.5 MG tablet TAKE 1 TABLET BY MOUTH DAILY. 90 tablet 1  . CRANBERRY PO Take 1 tablet by mouth daily.    Marland Kitchen. HYDROcodone-acetaminophen (NORCO) 7.5-325 MG tablet Take 1 tablet by mouth every 6 (six) hours as needed. 120 tablet 0  . loratadine (CLARITIN) 10 MG tablet Take 10 mg by mouth daily.      . Multiple Vitamin (MULTIVITAMIN WITH MINERALS) TABS tablet Take 1 tablet by mouth daily.    . naproxen (NAPROSYN) 375 MG tablet Take 1 tablet (375 mg total) by mouth 2 (two) times daily. 20 tablet 0  . omeprazole (PRILOSEC) 40 MG capsule Take 1 capsule (40 mg total) by mouth daily. 30 capsule 1  . ondansetron (ZOFRAN-ODT) 4 MG disintegrating tablet Take  1 tablet (4 mg total) by mouth every 8 (eight) hours as needed for nausea or vomiting. 20 tablet 0  . docusate sodium (COLACE) 100 MG capsule Take 1 capsule (100 mg total) by mouth 2 (two) times daily as needed for mild constipation. (Patient not taking: Reported on 01/26/2017) 20 capsule 1   No current facility-administered medications on file prior to visit.     BP 138/78   Pulse 75   Temp 98.3 F (36.8 C) (Oral)   Ht 6\' 3"  (1.905 m)   Wt 160 lb (72.6 kg)   SpO2 92%   BMI 20.00 kg/m     Review of Systems  Constitutional: Negative for appetite change, chills, fatigue and fever.  HENT: Negative for congestion, dental problem, ear pain, hearing loss, sore throat, tinnitus, trouble swallowing and voice change.   Eyes: Negative for pain, discharge and visual disturbance.  Respiratory: Negative for cough, chest tightness, wheezing and stridor.   Cardiovascular: Negative for chest pain, palpitations and leg swelling.  Gastrointestinal: Negative for abdominal distention, abdominal pain, blood in stool, constipation, diarrhea, nausea and vomiting.  Genitourinary: Negative for difficulty urinating, discharge, flank pain, genital sores, hematuria and urgency.  Musculoskeletal: Positive for arthralgias and back pain. Negative for gait problem, joint swelling, myalgias and neck stiffness.  Skin: Negative for rash.  Neurological: Negative for dizziness, syncope, speech difficulty, weakness, numbness and headaches.  Hematological: Negative for adenopathy. Does not bruise/bleed easily.  Psychiatric/Behavioral: Negative for behavioral problems and dysphoric mood. The patient is not nervous/anxious.        Objective:   Physical Exam  Constitutional: He is oriented to person, place, and time. He appears well-developed. No distress.  Repeat blood pressure 138/78  HENT:  Head: Normocephalic.  Right Ear: External ear normal.  Left Ear: External ear normal.  Eyes: Conjunctivae and EOM are normal.   Neck: Normal range of motion.  Cardiovascular: Normal rate and normal heart sounds.   Pulmonary/Chest: Breath sounds normal.  Abdominal: Bowel sounds are normal.  Musculoskeletal: Normal range of motion. He exhibits no edema or tenderness.  Neurological: He is alert and oriented to person, place, and time.  Psychiatric: He has a normal mood and affect. His behavior is normal.          Assessment & Plan:   Iron deficiency anemia.  Will check Cologuard  And if positive, proceed with colonoscopy.  Patient was counseled on the important of GI evaluation Essential hypertension Coronary artery disease Type 2 diabetes  Follow-up 3 months  KWIATKOWSKI,PETER Homero FellersFRANK

## 2017-02-06 ENCOUNTER — Telehealth: Payer: Self-pay | Admitting: Internal Medicine

## 2017-02-06 DIAGNOSIS — M48062 Spinal stenosis, lumbar region with neurogenic claudication: Secondary | ICD-10-CM

## 2017-02-06 NOTE — Telephone Encounter (Signed)
HYDROcodone-acetaminophen (NORCO) 7.5-325 MG #120 was refilled on 01/12/2017   Pt should be taking medication :1 tablet by mouth every 6 (six) hours as needed Rx must last 30 days. Too early to fill.    May print Rx after 02/12/17

## 2017-02-06 NOTE — Telephone Encounter (Signed)
Pt request refill  HYDROcodone-acetaminophen (NORCO) 7.5-325 MG tablet  Wife states pt is out and needs asap.

## 2017-02-07 NOTE — Telephone Encounter (Signed)
pts wife is calling to check the status of the Rx and would like to get it today due to pts is in a lot of pain.  She state that July has 31 days and he is out.

## 2017-02-08 NOTE — Telephone Encounter (Signed)
Spoke to wife and informed her that pt's medication was last refilled on 01/12/17 and her husband is due for a refill on 02/11/17. Wife states that "you drop pills on the floor and have to throw them away" she is requesting extra tablets because #120 are not enough for one month.   Please advise.

## 2017-02-09 NOTE — Telephone Encounter (Signed)
No early refill Maximum per month.  #120

## 2017-02-12 MED ORDER — HYDROCODONE-ACETAMINOPHEN 7.5-325 MG PO TABS: 1.0000 | ORAL_TABLET | Freq: Four times a day (QID) | ORAL | 0 refills | Status: AC | PRN

## 2017-02-12 NOTE — Telephone Encounter (Signed)
Wife states pt is needing his Rx asap. Would like to know when she can pick up?

## 2017-02-12 NOTE — Telephone Encounter (Signed)
Rx printed, awaiting to be signed.  Please advise Pt and Spouse that Rx must last 30 days.

## 2017-03-10 DEATH — deceased

## 2017-03-15 ENCOUNTER — Ambulatory Visit: Payer: Medicare Other | Admitting: Internal Medicine

## 2017-04-05 ENCOUNTER — Telehealth: Payer: Self-pay | Admitting: Internal Medicine

## 2017-04-05 NOTE — Telephone Encounter (Signed)
Pt is deceased.  Pts wife called stating that someone called the house stating that they need to speak with the pt and that they will call back she is unsure who the caller was.  Please do not call the D[facility] she state that it is very disturbing to have crazy calls.  Wife (Nell) was very nasty and using profanity.  Pt is deceased.

## 2017-05-01 ENCOUNTER — Ambulatory Visit: Payer: Medicare Other | Admitting: Internal Medicine
